# Patient Record
Sex: Male | Born: 1980 | Race: Black or African American | Hispanic: No | Marital: Single | Smoking: Never smoker
Health system: Southern US, Community
[De-identification: ages and names within clinical notes are randomized; demographics above are authoritative.]

## PROBLEM LIST (undated history)

## (undated) DIAGNOSIS — E785 Hyperlipidemia, unspecified: Secondary | ICD-10-CM

## (undated) DIAGNOSIS — G459 Transient cerebral ischemic attack, unspecified: Secondary | ICD-10-CM

## (undated) DIAGNOSIS — I639 Cerebral infarction, unspecified: Secondary | ICD-10-CM

## (undated) DIAGNOSIS — F419 Anxiety disorder, unspecified: Secondary | ICD-10-CM

## (undated) DIAGNOSIS — E876 Hypokalemia: Secondary | ICD-10-CM

## (undated) DIAGNOSIS — E669 Obesity, unspecified: Secondary | ICD-10-CM

## (undated) DIAGNOSIS — I10 Essential (primary) hypertension: Secondary | ICD-10-CM

## (undated) DIAGNOSIS — F32A Depression, unspecified: Secondary | ICD-10-CM

## (undated) DIAGNOSIS — E119 Type 2 diabetes mellitus without complications: Secondary | ICD-10-CM

## (undated) DIAGNOSIS — G43909 Migraine, unspecified, not intractable, without status migrainosus: Secondary | ICD-10-CM

## (undated) DIAGNOSIS — K219 Gastro-esophageal reflux disease without esophagitis: Secondary | ICD-10-CM

## (undated) DIAGNOSIS — I4891 Unspecified atrial fibrillation: Secondary | ICD-10-CM

## (undated) HISTORY — DX: Obesity, unspecified: E66.9

## (undated) HISTORY — PX: HAND SURGERY: SHX662

## (undated) HISTORY — PX: TONSILLECTOMY: SUR1361

## (undated) HISTORY — DX: Gastro-esophageal reflux disease without esophagitis: K21.9

## (undated) HISTORY — DX: Transient cerebral ischemic attack, unspecified: G45.9

## (undated) HISTORY — DX: Hypokalemia: E87.6

## (undated) HISTORY — DX: Anxiety disorder, unspecified: F41.9

## (undated) HISTORY — DX: Depression, unspecified: F32.A

## (undated) HISTORY — PX: OTHER SURGICAL HISTORY: SHX169

## (undated) NOTE — ED Notes (Signed)
 Formatting of this note might be different from the original. Patient BIB EMS for CVA. Patient was at dialysis when sudden onset of left sided chest pain started and RUE RLE numbness around 0830. GCS 15. VSS. BG 118. Patient given 324mg  Aspirin  with EMS. Patient has fistula to LUE, M/W/F. + Eliquis - last taken today. Reports missing a dose of Eliquis  Wednesday night. 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  Electronically signed by Cris Fine, RN at 06/28/2023 11:57 AM EDT

## (undated) NOTE — Progress Notes (Signed)
 Formatting of this note is different from the original. Images from the original note were not included. Name/Age:  Victor Bryan 11 y.o. ([id number]) Admitted: 06/28/2023 (HD #0) Admit diagnosis:  Acute right-sided weakness [R53.1]  Attending: Prentis Deters, MD   [facility] Medicine  Team IOU [phone]) Daily Progress Note for 06/29/2023    Subjective / Interval History    Patient seen and examined at bedside. Patient with no new complaints.   - At baseline mobility PT/OT Eval discontinued  - MRI preliminary with no acute intracranial abnormality. Specifically, no acute infarction or hemorrhage - TTE - with significant improvement from previous study   - Follow-up on TCD and CUS - Await neurology final recs  3 point ROS negative except per HPI  Objective  Vitals:  Temp  Avg: 36.7 C (98 F)  Min: 36.4 C (97.5 F)  Max: 37 C (98.6 F) Pulse  Avg: 90.2  Min: 85  Max: 96 BP  Min: 113/65  Max: 167/90 SpO2  Avg: 98.8 %  Min: 96 %  Max: 100 %  Weights:  Wt Readings from Last 3 Encounters:  06/28/23 97.5 kg (215 lb)  08/15/22 97.5 kg (215 lb)  08/12/09 (!) 112.9 kg (248 lb 12.8 oz)   Ins & Outs (24h): No intake or output data in the 24 hours ending 06/29/23 0740  Physical Exam: General: WNWD, NAD  HEENT: MMM, sclera anicteric  Cardiovascular: No JVD. RRR with normal S1, S2. No m/r/g. No pedal edema. LUE AVF + thrill and bruits  Respiratory: lungs CTAB without wheezes, rhonchi, or rales GI: abdomen soft, no tenderness or distention, +BS GU: no foley SKIN: no jaundice, no obvious lesions Extremities: no c/c/e  Neuro: A&OX3. LUE and LLE >RUE and LLE  Psych: appropriate mood and affect   Medications   Scheduled Medications: moisturizer/plaque preventer, 1 each, BID apixaban , 5 mg, BID [Held by Provider] carvedilol , 25 mg, BID [Held by Provider] hydrALAZINE , 100 mg, EVERY 8 HOURS esomeprazole, 40 mg, every morning before breakfast [Held by Provider]  torsemide, 100 mg, BID diuretics insulin  aspart, 0-8 Units, TID w/meals  And insulin  aspart, 0-4 Units, Nightly insulin  glargine, 16 Units, Nightly   Infusions:    PRN Medications: normal saline, 3 mL, PRN sennosides, 2 tablet, Nightly PRN polyethylene glycol, 17 g, Daily PRN acetaminophen , 325 mg, Q6H PRN acetaminophen , 650 mg, Q6H PRN acetaminophen , 975 mg, Q6H PRN glucose, 4 tablet, PRN  Or dextrose , 25 g, PRN  Labs & Diagnostics   Labs: Recent Labs    06/28/23 1121 06/29/23 0656  WBC 8.8  --   HGB 11.3*  --   HCT 34.3*  --   MCV 82.0  --   PLT 196  --   NA 138 137  K 3.5 4.0  CO2 34* 33*  BUN 23* 34*  CREATININE 4.5* 6.2*  ALT 6*  --   AST 13  --   ALKPHOS 65  --   BILITOT 0.4  --   BILIDIR 0.1  --   PROT 6.9  --   INR 1.2*  --    Micro/path:  Microbiology Results (last 3 day)     ** No results found for the last 72 hours. **     Radiology: Radiology Results (last 1 day)     Procedure Component Value Units Date/Time   MRI Brain wo Contrast [[id number]] Collected: 06/28/23 1838   Order Status: Completed Updated: 06/28/23 1914   Narrative:     EXAM: MRI BRAIN  WO CONTRAST  CLINICAL INDICATION:  Stroke, follow up No device( OSA, HTN). 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   TECHNIQUE: Multiplanar, multisequence imaging of the brain without contrast was performed. [id number].[id number]  COMPARISON: CT head/CTA head neck 06/28/2023.  FINDINGS: Parenchyma: No acute infarction on DWI. Chronic lacunar insult involves the right paramedian pons on image 10 series 19. Dilated perivascular spaces are identified in the inferior basal ganglia. Chronic lacunar insult involves the right rostrum of the  corpus callosum with additional  involvement of the right frontal coronal white matter. See for example axial image 17 and 18 series 19. No hemorrhage. No mass or mass effect.  Extra-axial Collection:  None.  Ventricular System: Normal.  Major Intracranial Flow Voids: Normal.  Bones/Soft tissues: No acute abnormality.  Included Orbits: No acute abnormality.  Paranasal Sinuses: Mucous retention cyst in the right frontal sinus and right maxillary sinus.  Tympanomastoid Cavities:  Partial right mastoid effusion..   Impression:     1.    No acute intracranial abnormality. Specifically, no acute infarction or hemorrhage. 2.    Chronic lacunar infarcts involve the right paramedian pons, right rostrum of the corpus callosum and right frontal corona white matter.   Preliminary findings were published to the electronic medical record by Marlyce Cowing, MD.  COMMUNICATION:  None.  Prelim reconciliation: RPR1 - Agree  The images were reviewed and interpreted by Matthew Zygmont, MD.   XR Chest X-ray 1 View [[id number]] Collected: 06/28/23 1317   Order Status: Completed Updated: 06/28/23 1320   Narrative:     EXAM: XR CHEST PA OR AP  CLINICAL INDICATION:  Chest Pain   COMPARISON: 08/15/2022   FINDINGS:  Support Devices: None   Lungs/pleura: No pneumothorax. There is bibasilar atelectasis. No pleural effusions.  Heart/mediastinum: Mildly enlarged, unchanged.  Other: None   Impression:     Bibasilar atelectasis. Superimposed mild edema would be difficult to exclude.    CT Angiogram Head Neck W Contrast [[id number]] Collected: 06/28/23 1226   Order Status: Completed Updated: 06/28/23 1249   Narrative:     EXAM: CT ANGIOGRAM HEAD NECK W CONTRAST, CT BRAIN PERFUSION W CONTRAST  CLINICAL INDICATION:  Neuro deficit, acute, stroke suspected.  TECHNIQUE: CTA and CTP of the head and CTA of the neck with IV contrast. Multiplanar reconstructed images. 3D reconstructions with MIP images were performed. Informed written consent  was obtained prior to administration of nonionic intravenous contrast  material per standard departmental protocol.  COMPARISON: None.  FINDINGS: CTA NECK: Aortic arch and proximal arch vessels: Normal.  Right carotid arterial system: Normal. Mildly tortuous course of the proximal ICA. Left carotid arterial system: Normal. Partially retropharyngeal course of the left ICA.  Right vertebral artery: Normal Left vertebral artery: Normal  Where applicable, evaluation of internal carotid artery (ICA) stenosis was performed using NASCET-like criteria, where the site of greatest stenosis is compared to the diameter of the ICA distal to the stenosis at a point where the ICA walls become  parallel.  Neck Soft Tissues: Normal.  Osseous Structures: Normal  Included Lung Apices: Normal.  CT PERFUSION: Perfusion images demonstrate mildly increased TMax in the right ACA territory, with a smaller region of patchy the decreased blood flow in the same distribution. These  findings correlate to the age-indeterminate lacunar infarctions seen on the  noncontrast head CT.  CTA HEAD:  Right anterior circulation (ICA/ACA/MCA): Normal. Left anterior circulation (ICA/ACA/MCA): Normal.  Vertebral and basilar arteries: Normal. Right posterior cerebral artery (PCA): Normal. Left posterior cerebral artery (PCA): Normal.  Aneurysm/Vascular malformation: None.  Dural Venous Sinuses: No venous filling defect.     Impression:     1.    No large vessel occlusion or hemodynamically significant stenosis in the major arterial vasculature of the head or neck. 2.    Mismatched area of decreased Tmax in the right ACA territory with patchy areas of decreased blood flow within this region. The areas of decreased blood flow correlate to to regions of age-indeterminate lacunar infarctions on the noncontrast head  CT. Findings are of unclear clinical significance in absence of corresponding vascular abnormality, and  correlation with clinical symptoms and consideration of further evaluation with MRI is recommended.   CT Brain Perfusion w Contrast [[id number]] Collected: 06/28/23 1226   Order Status: Completed Updated: 06/28/23 1249   Narrative:     EXAM: CT ANGIOGRAM HEAD NECK W CONTRAST, CT BRAIN PERFUSION W CONTRAST  CLINICAL INDICATION:  Neuro deficit, acute, stroke suspected.  TECHNIQUE: CTA and CTP of the head and CTA of the neck with IV contrast. Multiplanar reconstructed images. 3D reconstructions with MIP images were performed. Informed written consent was obtained prior to administration of nonionic intravenous contrast  material per standard departmental protocol.  COMPARISON: None.  FINDINGS: CTA NECK: Aortic arch and proximal arch vessels: Normal.  Right carotid arterial system: Normal. Mildly tortuous course of the proximal ICA. Left carotid arterial system: Normal. Partially retropharyngeal course of the left ICA.  Right vertebral artery: Normal Left vertebral artery: Normal  Where applicable, evaluation of internal carotid artery (ICA) stenosis was performed using NASCET-like criteria, where the site of greatest stenosis is compared to the diameter of the ICA distal to the stenosis at a point where the ICA walls become  parallel.  Neck Soft Tissues: Normal.  Osseous Structures: Normal  Included Lung Apices: Normal.  CT PERFUSION: Perfusion images demonstrate mildly increased TMax in the right ACA territory, with a smaller region of patchy the decreased blood flow in the same distribution. These findings correlate to the age-indeterminate lacunar infarctions seen on the  noncontrast head CT.  CTA HEAD:  Right anterior circulation (ICA/ACA/MCA): Normal. Left anterior circulation (ICA/ACA/MCA): Normal.  Vertebral and basilar arteries: Normal. Right posterior cerebral artery (PCA): Normal. Left posterior cerebral artery (PCA): Normal.  Aneurysm/Vascular malformation:  None.  Dural Venous Sinuses: No venous filling defect.     Impression:     1.    No large vessel occlusion or hemodynamically significant stenosis in the major arterial vasculature of the head or neck. 2.    Mismatched area of decreased Tmax in the right ACA territory with patchy areas of decreased blood flow within this region. The areas of decreased blood flow correlate to to regions of age-indeterminate lacunar infarctions on the noncontrast head  CT. Findings are of unclear clinical significance in absence of corresponding vascular abnormality, and correlation with clinical symptoms and consideration of further evaluation with MRI is recommended.   CT Acute Stroke Protocol(CT Head wo Contrast Only) [[id number]] Collected: 06/28/23 1147   Order Status: Completed Updated: 06/28/23 1229   Narrative:     EXAM: CT ACUTE STROKE PROTOCOL (CT HEAD WO CONTRAST ONLY)  CLINICAL INDICATION:   Neuro deficit, acute, stroke suspected.  TECHNIQUE:  CT examination of  the head without IV contrast. Axial, sagittal, and coronal reconstructed images.  COMPARISON:  CT dated 08/15/2022.  FINDINGS:  Surgical/Devices/Scout: None.  Parenchyma: No acute large territory infarction. Chronic right frontal corona radiata lacunar infarct. Interval adjacent hypodensity most likely also representing lacunar infarct, age-indeterminate (series 602, image 51). No intraparenchymal hemorrhage.  No mass or mass effect.  Extra-axial Collection:  None.  Ventricular System:  Normal.  Dural Venous Sinuses:  Normal attenuation.  Osseous/Soft Tissue Structures:  No acute abnormality.  Included Orbits: No acute abnormality.  Paranasal Sinuses:  Right greater than left posterior sinus mucous retention cysts.  Tympanomastoid Cavities:  Partial ossification of the right mastoid air cells.  Other:  Similar mild prominence of the nasopharyngeal soft tissues.    Impression:     1.    No intracranial hemorrhage or acute  large vascular territory infarct.  2.    Small right frontal corona radiata lacunar infarcts, one of which is new from comparison examination and age-indeterminate.  COMMUNICATION:  Critical findings were identified on 06/28/2023 11:34 AM and communicated to the Stroke Service via JOIN secure messaging application (with verification of receipt) by Dr. Sharolyn Kelly.  The images were reviewed and interpreted by Bert Expose, MD.     Telemetry/EKG:   Assessment & Plan  Travanti Mcmanus is a 74 y.o. 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 LKN 0800. He is not candidate for tenecteplase because he took his home eliquis  this AM.  He also was given 324 mg ASA per EMS.   Patient reports symptom resolution on my interview. - CT Acute stoke protocol negative for  acute infarct but notable for Chronic right frontal corona radiata lacunar infarct.  - CT Angio Head Neck with no large vessel occlusion or significant stenosis  - CT Brain Perfusion with  No large vessel occlusion or hemodynamically significant stenosis in the major arterial vasculature of the head or neck but with decreased blood flow to right ACA . - On admission neurology consulted recs noted and appreciated  Workup - Stroke labs: A1C, TSH, RPR, lipid panel, UDS, B12, HIV  - MRI brain, MRA head/neck wo contrast (No need for MRA if CTA already obtained)  - Hold home Eliquis  until MRI completed - TTE w/ bubble study  - Transcranial Doppler (ordered for you) - Carotid Ultrasound  (ordered for you) - Telemetry to monitor for arrhythmia/cardioembolic etiology -  w/u for Toxic/Metabolic/infectious contributing etiology   - Allow for Permissive HTN in the first 24-48 hours. Up to 220/110.  (No intervention was done)       * Can use Hydralazine  10mg  IV q6hrs and Labetalol  10mg  q5mins PRN for SBP>220 or DBP> 110  - NPO until passes swallow eval - PT/OT/speech therapy - Strict DM control as outpatient as appropriate. Avoid hyperglycemia as inpatient.  - Stroke lab results reviewed  - MRI preliminary No acute intracranial abnormality. Specifically, no acute infarction or hemorrhag  - Follow up on neuro imaging as discussed above (TCD, CUS) -  Passed bedside swallow - SLP consult deferred.  PT/OT discontinued patient at baseline and ambulates with stability  - Patient discussed that he was not placed on a statin despite CVA x 2 . Will defer starting per neurology   #ESRD on HD #Troponinemia #Chest pain- resolved  K-3.5, Cr. 4.5 and GFR 16, AG-10, Trop 40>43  -ESRD on HD MWFvia LFA AVF  - Last HD today at [facility] [place]. Session aborted 30 mins prior to end  - Reports compliance with all HD sessions.  - No urgent need to consult nephrology. Will consult renal if stay extends toward Monday - Follow-up on Hep B panel   - Not on any renal binders  - c/w Daily weight, renal diet, telemetry monitoring, strict I&O's  - CTM lytes   #Type II DM Ha1c 9.0% 06/28/23 - Home regimen Tresiba 20 units Daily and Ozempic 1 mg SQ weekly.  [place] Ohs non-formulary. Continue Lantus 16 units daily  - POCT, AC/HS +SSI . Patient requesting to use his freestyle libre for glucose checks - Carb controlled diet  #HFpEF BNP 164  Notable improvement on this admission 06/29/23 TTE LVEF 65-70% with normal LV diastolic function and NRWM - Last TTE 04/30/2023 at OSH LVEF 51-55% GIIDD  Euvolemic on exam , not in acute exacerbation  - Hold home GDMT: Hydralazine  100 mg PO TID,  Carvedilol  25 mg PO BID and Torsemide 100 mg PO BID to allow for permissive hypertension  - Low sodium diet 2 g and fluid restriction <1.5 L, telemetry, daily weight    #Atrial fibrillation  #Longterm anticogulation TSH 0.89 - Rate controlled and reports compliance with home meds but missed dose on Wednesday  - Continue with home dose Eliquis  5 mg PO BID pending MRI findings - Telemetry   #OSA - Patient does not use a CPAP machine at home and requesting a sleep study - Ambulatory referral on dispo   Fluids:  None  Diet:   Renal Carb Control low Na diet 1 L Fluid restriction   Lines and Catheters:  PIV LUE AVF  VTE Prophylaxis:   Home dose Eliquis  Family Contact:  Primary Emergency Contact: Gordon,Suzette, Home Phone: [phone] Discharge Planning: Continues to require inpatient care.   Discharge Planning     (HD #0)   Anticipated Discharge Date 06/30/2023  Anticipated Discharge Location Home  Barriers to Clinical Progression  TCD, CUS, Neuro final recs    Code status. Full Code.  Arbutus Motto, NP  [facility] Medicine APP  Team IOU Team Pager ID: [phone] Date: 06/29/2023  I spent 35 minutes preparing to see the patient, obtaining history from the patient, examining and evaluating the patient, counseling and educating the patient, ordering medications and tests, documenting clinical information in the the electronic health record and care coordination.   Electronically signed by Motto Arbutus Schultz, NP at 06/29/2023  8:00 AM EDT Electronically signed by Motto Arbutus Schultz, NP at 06/29/2023 10:01 AM EDT

## (undated) NOTE — Unmapped External Note (Signed)
 Formatting of this note might be different from the original. Family at bedside, pt denies additional needs at this time  Electronically signed by Daphane No, RN at 06/29/2023  2:57 PM EDT

## (undated) NOTE — Unmapped External Note (Signed)
 Formatting of this note might be different from the original. This pt requires contrast administration for emergency imaging to investigate potential CVA. Despite known renal disease, risks of delaying or omitting contrast administration do not outweigh the benefits. I suspect that delaying or omitting contrast administration would increase this patient's risk of debilitating or life-threatening consequences.   Alger Samule Armour, MD [facility] Medicine PGY-2  Cosigned by Blase Zachary Hilding, MD at 06/30/2023  1:03 PM EDT Electronically signed by Armour Alger Samule, MD at 06/28/2023 11:21 AM EDT Electronically signed by Blase Zachary Hilding, MD at 06/30/2023  1:03 PM EDT  Associated attestation - Blase Zachary Hilding, MD - 06/30/2023  1:03 PM EDT Formatting of this note is different from the original. 06/30/2023 1:03 PM EDT   I saw and evaluated the patient.  Discussed with resident and agree with resident?s findings and plan as documented in the resident?s note.  Zachary Hilding Blase, MD

## (undated) NOTE — Unmapped External Note (Signed)
 Formatting of this note might be different from the original. Pt at bedside  Electronically signed by Daphane No, RN at 06/29/2023 12:28 PM EDT

## (undated) NOTE — Unmapped External Note (Signed)
 Formatting of this note might be different from the original. No change in status, continue with current plan of care  Electronically signed by Daphane No, RN at 06/29/2023  9:57 AM EDT

## (undated) NOTE — Unmapped External Note (Signed)
 Formatting of this note might be different from the original. Provider at bedside, pt reports improving but not resolved right sided weakness  Electronically signed by Daphane No, RN at 06/29/2023  8:14 AM EDT Electronically signed by Daphane No, RN at 06/29/2023  8:21 AM EDT

## (undated) NOTE — Nursing Note (Signed)
 Formatting of this note might be different from the original. Pt provided with juice and crackers. Pt denies needing anything else at this time Electronically signed by Trinidad Grate, RN at 06/28/2023  9:41 PM EDT
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
## (undated) NOTE — Progress Notes (Signed)
 Formatting of this note might be different from the original. Pt discharged in stable condition with all personal belongings  Electronically signed by Daphane No, RN at 06/29/2023  6:24 PM EDT
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
## (undated) NOTE — Nursing Note (Signed)
 Formatting of this note might be different from the original. Patient refusing when returned from MRI to get Blood sugar states the machine he has on him tell Youth worker signed by Gerldine Pippin, RN at 06/28/2023  8:16 PM EDT

## (undated) NOTE — Progress Notes (Signed)
 Formatting of this note might be different from the original. [facility] BRIEF NOTE No stroke on MRI. Transient symptoms resolved in the setting of dialysis. Could be TIA vs. provoked by fluid shifts in HD with his existing stroke history.   Keep his home eliquis  Follow-up his chem 3 Continue Atorvastatin  40mg  daily unless he has an LDL>70, then please place him on Atorvastatin  80mg  daily.   Jasmine Abram, MD  [facility] Neurology PGY-3 Cosigned by Navalkele, Digvijaya, MD at 07/06/2023  6:56 PM EDT Electronically signed by Milta Rolin Geroge Willma, MD at 06/29/2023 10:32 PM EDT Electronically signed by Navalkele, Digvijaya, MD at 07/06/2023  6:56 PM EDT  Associated attestation - Navalkele, Digvijaya, MD - 07/06/2023  6:56 PM EDT Formatting of this note might be different from the original. I am administratively signing this document. Digvijaya  Navalkele, MD

## (undated) NOTE — Unmapped External Note (Signed)
 Formatting of this note might be different from the original. CRITICAL CARE NOTE  Critical Care Time:  34 minutes separate from teaching time and exclusive of procedure time.  Patient Impending Deterioration:  Central Nervous System  Assoc Risk Factors:   Management:  Bedside Assessment and Supervision of Care   Treatment Response: see below  Additional Notes:  LKN 0830.  At HD.  Occurred during treatment.  On eliquis .  Electronically signed by Blase Zachary Hilding, MD at 06/30/2023  8:54 AM EDT

## (undated) NOTE — Unmapped External Note (Signed)
 Formatting of this note might be different from the original.  Problem: Knowledge Deficit Goal: Patient will verbalize and/or demonstrate understanding of teaching by discharge Outcome: Continue with Current Goal Goal: Patient will Participate in learning opportunities offered by staff Outcome: Continue with Current Goal  Problem: Pain/Comfort Goal: Adequate pain control while hospitalized Outcome: Continue with Current Goal  Problem: Fall Risk Goal: Patient will remain as independent as possible Outcome: Continue with Current Goal Goal: Patient will have lower fall risk. Lower injury risk. Outcome: Continue with Current Goal Goal: Patient will remain safe from falls and injury Outcome: Continue with Current Goal Goal: Patient/family will understand fall prevention measures Outcome: Continue with Current Goal Goal: Patient/family will understand injury reduction measures Outcome: Continue with Current Goal Goal: Patient/family will comply with fall program Outcome: Continue with Current Goal Goal: Patient/family will verbalize fall prevention strategies to implement after discharge Outcome: Continue with Current Goal  Electronically signed by Trinidad Grate, RN at 06/28/2023  9:39 PM EDT

## (undated) NOTE — Discharge Summary (Signed)
 Formatting of this note is different from the original. Images from the original note were not included. Physician Discharge Summary   Patient ID: Victor Bryan [id number] 42 y.o. [birth year]  Admit date: 06/28/2023  Discharge date and time: 06/29/2023 and 1630 PM EST   Interpreter used on day of discharge: no  Admitting Physician: Prentis Deters, MD   Discharge Physician:  Prentis Deters, MD   Admission Diagnoses: Acute right-sided weakness [R53.1]  Discharge Diagnoses: Acute right-sided weakness  Incidental findings: None   Admission Condition: fair  Discharged Condition: good  Indication for Admission: Victor Bryan is a 71 y.o. 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 Medicine IOU asked to admit patient for Stroke work-up and medical management   Hospital Course:  #Acute right upper and lower extremity weakness - resolved  #Hx Prior CVA #TIA   Presented to [facility] as stroke alert from Dialysis center with acute onset of right upper and lower extremity weakness. LKN 0800. Neurology consulted recs noted and appreciated. He was not candidate for tenecteplase because he took his home eliquis  this AM.  He also was given 324 mg ASA per EMS. Patient symptoms resolved within hours of [facility] arrival.  CT Acute stoke protocol negative for  acute infarct but notable for Chronic right frontal corona radiata lacunar infarct. CT Angio Head Neck with no large vessel occlusion or significant stenosis. CT Brain Perfusion with  No large vessel occlusion or hemodynamically significant stenosis in the major arterial vasculature of the head or neck but with decreased blood flow to right ACA .  MRI with no acute intracranial abnormality.  Specifically, no acute infarction or hemorrhage. CUS  findings patent and no hemodynamic significant stenosis bilaterally. TCD findings velocities are consistent with mild stenosis in bilateral MCAs and recs to repeat TCD in 6-12 months. Unclear etiology as to what mitigated his presenting TIA symptoms. Can consider given the fact that he has atrial fibrillation in the setting of missed Eliquis  complicated by his need for HD.  - Discharged with Atorvastatin  80 mg PO daily sent to home pharmacy   #ESRD on HD #Troponinemia #Chest pain- resolved  K-4.0  -Resume HD sessions MWF as scheduled on dispo    #Type II DM Ha1c 9.0% 06/28/23 - Continue with Tresiba 20 units Daily and Ozempic 1 mg SQ weekly on dispo.   #HFpEF BNP 164  Notable improvement on this admission 06/29/23 TTE LVEF 65-70% with normal LV diastolic function and NRWM - Last TTE 04/30/2023 at OSH LVEF 51-55% GIIDD  - Continue with GDMT: Hydralazine  100 mg PO TID, Carvedilol  25 mg PO BID and Torsemide 100 mg  on dispo  - Has close follow-up with cardiology on 07/02/23   #Atrial fibrillation  #Longterm anticogulation - Rate controlled, continue with home dose Eliquis  5 mg PO BID. - Discussed medication compliance with patient   #OSA - Ambulatory referral placed for sleep study on dispo    Consults: neurology  Significant Diagnostic Studies:  Labs: CMP: Recent Labs    06/28/23 1121 06/29/23 0656  NA 138 137  K 3.5 4.0  CL 94* 94*  CO2 34* 33*  BUN 23* 34*  CREATININE 4.5* 6.2*  GLU 131* 242*  CALCIUM  8.6*  8.5*  BILITOT 0.4  --   BILIDIR 0.1  --   ALKPHOS 65  --   AST 13  --   ALT 6*  --   PROT 6.9  --   LABALB 3.9 3.7   CBC and Coags: Recent Labs    06/28/23 1121  WBC 8.8  HGB 11.3*  HCT 34.3*  MCV 82.0  PLT 196  INR 1.2*   Cardiac Enzymes: No results for input(s): CKTOTAL, CKMB, TROPONINI in the last 72 hours.  Micro/path:  Microbiology Results (last 3 day)     ** No results found for the  last 72 hours. **     Radiology: Radiology Results (last 1 day)     Procedure Component Value Units Date/Time   NEURO TRANSCRANIAL DOPPLER COMPLETE (TCD) [[id number]] Collected: 06/29/23 1228   Order Status: Completed Updated: 06/29/23 1238   Narrative:     NEURO TCD Report:   Reason for Study: Stroke/TIA    COMPARISON:  CT angiogram 06/28/2023    FINDINGS: Complete report of waveforms and velocity measurements are present on images with preliminary report on PACS. All images were reviewed.    Impression:     There is evidence of elevated mean flow velocities in bilateral MCAs (right 85 cm/s, left 96 cm/s).   Velocities are consistent with mild stenosis in bilateral MCAs.  Other vessels insonated show no direct evidence of elevated velocities, stenosis or vasospasm.    Given evidence of elevated intracranial velocities, recommend follow up Neuro TCD in 6-12 months.   Beverley Piety, MD, RPNI Vascular Neurology Attending   NEURO CAROTID ULTRASOUND (DUPLEX) COMPLETE [[id number]] Collected: 06/29/23 1227   Order Status: Completed Updated: 06/29/23 1231   Narrative:     NEURO CAROTID DOPPLER ULTRASOUND  CLINICAL INDICATION:  Stroke/TIA     TECHNIQUE: Real-time gray-scale, pulsed-wave, and color Doppler images of the carotid arteries were obtained.  COMPARISON: CT angiogram 06/28/2023  FINDINGS: Complete report of waveforms and velocity measurements are present on images with preliminary report on PACS. All images were reviewed. Degree of stenosis in vessels based on The Society of Radiologist in Ultrasound (SRU) criteria for carotid  stenosis interpretation described in Carotid Artery Stenosis: Grayscale and Doppler Ultrasound Diagnosis -- SRU Consensus Conference.    Impression:     RICA with peak systolic velocity of 97 cm/s consistent with Less than 50%  stenosis with minimal  plaque noted in the proximal ICA.  LICA with peak systolic velocity of 76 cm/s consistent with   Less than 50%  stenosis with mild hyperechoic plaque noted in the proximal ICA.   R Subclavian artery patent, no hemodynamic significant stenosis  L Subclavian artery patent, no hemodynamic significant stenosis   R Vertebral artery antegrade.  L Vertebral artery antegrade.   Beverley Piety, MD, [facility] Vascular Neurology Attending   MRI Brain wo Contrast [[id number]] Collected: 06/28/23 1838   Order Status: Completed Updated: 06/28/23 1914   Narrative:     EXAM: MRI BRAIN WO CONTRAST  CLINICAL INDICATION:  Stroke, follow up No device( OSA, HTN). Hypertension, Atrial fibrillation on Eliquis , , HFpEF, Type II DM, ESRD on HD, OSA,, prior CVA x2 (Aug 23, 2021 & May 29, 2021) without deficit presenting [facility]  from dialysis center as a stroke  alert reporting acute onset of right weakness towards the end of his HD session.   TECHNIQUE: Multiplanar, multisequence imaging of the brain without contrast was performed. ESRC.[id number]  COMPARISON: CT head/CTA head neck 06/28/2023.  FINDINGS: Parenchyma: No  acute infarction on DWI. Chronic lacunar insult involves the right paramedian pons on image 10 series 19. Dilated perivascular spaces are identified in the inferior basal ganglia. Chronic lacunar insult involves the right rostrum of the  corpus callosum with additional involvement of the right frontal coronal white matter. See for example axial image 17 and 18 series 19. No hemorrhage. No mass or mass effect.  Extra-axial Collection:  None.  Ventricular System: Normal.  Major Intracranial Flow Voids: Normal.  Bones/Soft tissues: No acute abnormality.  Included Orbits: No acute abnormality.  Paranasal Sinuses: Mucous retention cyst in the right frontal sinus and right maxillary sinus.  Tympanomastoid Cavities:  Partial right mastoid effusion..   Impression:     1.    No acute intracranial abnormality. Specifically, no acute infarction or hemorrhage. 2.    Chronic lacunar infarcts involve the  right paramedian pons, right rostrum of the corpus callosum and right frontal corona white matter.   Preliminary findings were published to the electronic medical record by Marlyce Cowing, MD.  COMMUNICATION:  None.  Prelim reconciliation: RPR1 - Agree  The images were reviewed and interpreted by Donnice Skiff, MD.   Treatments: Observation and neuro imaging   Discharge Exam: General: WNWD, NAD  HEENT: MMM, sclera anicteric  Cardiovascular: No JVD. RRR with normal S1, S2. No m/r/g. No pedal edema. LUE AVF + thrill and bruits  Respiratory: lungs CTAB without wheezes, rhonchi, or rales GI: abdomen soft, no tenderness or distention, +BS GU: no foley SKIN: no jaundice, no obvious lesions Extremities: no c/c/e  Neuro: A&OX3. No focal deficit   Psych: appropriate mood and affect   Disposition and Future Appointments:  Home  The following appointments also exist: No future appointments. Contact information for follow-up     Mabel Blackwood, MD  Specialty: Family Medicine  Relationship: PCP - General   [address] [place] [address]  Phone: [phone]    Next Steps: Follow up     Discharge   Home or Self Care   Diagnosis: CVA (cerebral vascular accident) (CMS-HCC) [phone] Initial Service: Medicine Service Preferred contact method (text or Phone) and Number for Bed Status Update: [phone] Bed Type: MED/SURG [9] Isolation Type: Not applicable Are you completing this order on behalf of Bed Control?: Yes Treatment Team (Bed Control to complete): Medicine [facility] Admitting Attending: ALINNOR, IHEOMA D. [[id number]] ...  Follow-Ups: Follow up with Mabel Blackwood, MD (Family Medicine)   Patient Instructions:  Nelvin DERWIN [facility] It has been a pleasure taking care of you! You were admitted to the hospital for Right upper and lower extremity weakness. You were evaluated for stroke. We also consulted neurology in your plan of care and performed stroke  labs and imaging to determine the cause of your symptoms.   Please pay close attention to your discharge paperwork as some of your medications have changed! New medications have been provided to you prior to leaving the hospital.   New medications: Current Discharge Medication List    START taking these medications   Details  atorvastatin  (LIPITOR) 80 MG tablet Take 1 tablet (80 mg total) by mouth every day. Qty: 90 tablet, Refills: 3   Comments: May substitute for rosuvastatin 40 mg due to insurance coverage Associated Diagnoses: Acute right-sided weakness     Please take your medications as prescribed to you. You should follow the list on your discharge summary.   Please do not skip any of your medications. If you are concerned that you  will be asleep when it is time to take your medication, please adjust the time you take it, but try to allow 12 hours between each dose. For medicines that are once a day, you can take them as soon as you wake up.   Please check your blood pressure regularly at home, at least once in the morning and once in the evening.  If you have diabetes, please check your blood sugar regularly at home, at least once in the morning and before mealtimes. You should check your blood sugar at least twice a day.  Future Appointments: Follow-up with all of your upcoming doctor's appointments as listed below. If you need to cancel or reschedule, please call [phone].   No future appointments.  If you develop fevers, chills, chest pain, shortness of breath, fainting, or any other new concerning symptoms, please return to the emergency department for evaluation.   Follow-up appointments have been scheduled for you. It is important that you attend all appointments as listed. Please bring this discharge paperwork to your appointment. If you need refills of any of these medications, please call your PCP.   ---  // Important numbers and information if needed: -  Blood Work:  WALK-IN to the main outpatient lab 1st floor, D wing.   From 7am-5pm Monday through Friday. - X-RAY: WALK-IN 3rd floor radiology, D wing.    From 7am-3pm Monday through Friday.  - PFTs (pulmonary function test):  2nd floor, D wing, Room 0051.   Call Central Scheduling to make appointment:  [phone] - CT Scan: 10th floor, B wing.   Call Central Scheduling to make appointment: [phone] - ECHO (heart ultrasound):  2nd floor, F wing.   Call to make appointment:  [phone] - [facility] (Main Outpatient), please call [phone] or [phone] for refill .  - Social Work questions, please call [phone].  - Nurse Advice Line [phone]  Arbutus Motto, APP   06/29/2023 4:25 PM EDT  Activity: activity as tolerated Diet: diabetic diet and renal diet  Wound Care Discharge Instructions: None   Medication List    START taking these medications    atorvastatin  80 MG tablet Commonly known as: LIPITOR Take 1 tablet (80 mg total) by mouth every day.     CONTINUE taking these medications    carvedilol  25 mg tablet Commonly known as: COREG   Eliquis  5 MG tablet Generic drug: apixaban   hydrALAZINE  100 mg tablet Commonly known as: APRESOLINE   omeprazole 40 mg capsule Commonly known as: PRILOSEC  torsemide 100 mg tablet Commonly known as: DEMADEX  Tresiba 100 units/mL injection Generic drug: insulin  degludec      Where to Get Your Medications    These medications were sent to [facility])  [address], [place] [address]    Hours: 7AM-7PM Mon-Fri Phone: [phone]  atorvastatin  80 MG tablet   I spent 35 minutes preparing to see the patient, obtaining history from the patient, examining and evaluating the patient, counseling and educating the patient, ordering medications and tests, documenting clinical information in the the electronic health record and care  coordination.  Signed: Arbutus Sharlynn Motto, NP 06/29/2023 4:27 PM EDT   Cosigned by Drenda Latin, MD at 06/30/2023 10:22 AM EDT Electronically signed by Motto Arbutus Sharlynn, NP at 06/29/2023  4:51 PM EDT Electronically signed by Drenda Latin, MD at 06/30/2023 10:22 AM EDT  Associated attestation - Drenda Latin, MD - 06/30/2023 10:22 AM EDT Formatting of  this note might be different from the original. Date of service: 06/30/23  I saw and evaluated the patient. I agree with the APP's discharge summary as documented. He still had some right sided weakness remaining, but it was much improved. Not sure if this was a TIA, but no stroke seen on MRI or other imaging to explain his symptoms. Was observed to ambulate and ate food without difficulty.   Nobie Deters, MD, PhD [facility] Medicine

## (undated) NOTE — H&P (Signed)
 Formatting of this note is different from the original. Images from the original note were not included. Inpatient History and Physical for Admission  Victor Bryan (MRN: [id number]) Admitting Service: [facility] [place], Pager [phone] Date of Admission:  06/28/2023  PRIMARY CARE PROVIDER: Mabel Blackwood, MD Admitting Attending: Prentis Deters, MD   Chief complaint:  Chief Complaint  Patient presents with   Cerebrovascular Accident    Patient BIB EMS for CVA. Patient was at dialysis with sudden onset of frontal headache, left sided chest pain, and RUE/RLE numbness. BG 118. +Eliquis . GCS 15. LKW 0830 AM today.    History of Present Illness:  Victor Bryan is a 25 y.o. 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  from dialysis center as a stroke alert reporting acute onset of right weakness towards the end of his HD session.   Patient reported this morning while on his way to dialysis he felt a wave of nausea without emesis. Towards the end of his session (30 mins) he became diaphoretic, associated with chest pain and he felt his right side go numb, he was unable to move his right hand and right foot. He then alert the technician, EMS alerted and hence his presentation to [facility]. Upon my interview patient reports that he is back to baseline with intact sensation and limb movement. Patient took all his home medication PTA including Eliquis  and did not meet criteria for TPA. He denies any illicit drug use.   In the ED, pt was given the following: Medications  normal saline 0.9 % flush 3 mL (has no administration in time range)  sennosides (SENNA-GEN) 8.6 MG tablet 2 tablet (has no administration in time range)  polyethylene glycol (MIRALAX ) packet 17 g (has no administration in time range)  moisturizer/plaque preventer (Antiseptic Mouthwash (Corinz)) mouth  swab 1 Swab (has no administration in time range)  acetaminophen  (TYLENOL ) tablet 325 mg (has no administration in time range)  acetaminophen  (TYLENOL ) tablet 650 mg (has no administration in time range)  acetaminophen  (TYLENOL ) tablet 975 mg (has no administration in time range)  apixaban  (ELIQUIS ) tablet 5 mg ( Oral Automatically Held 07/01/23 2200)  carvedilol  (COREG ) tablet 25 mg ( Oral Automatically Held 07/01/23 2200)  hydrALAZINE  (APRESOLINE ) tablet 100 mg ( Oral Automatically Held 07/01/23 1600)  esomeprazole (NEXIUM) capsule 40 mg (has no administration in time range)  torsemide (DEMADEX) tablet 100 mg ( Oral Automatically Held 07/01/23 1700)  glucose (DEX-4) chewable tablet 16 g (has no administration in time range)    Or  dextrose  50 % IV soln (has no administration in time range)  insulin  aspart (NOVOLOG ) injection 0-8 Units (has no administration in time range)    And  insulin  aspart (NOVOLOG ) injection 0-4 Units (has no administration in time range)  insulin  glargine (LANTUS) inj (DO NOT hold for low BG in NON-ICU patient) 16 Units (has no administration in time range)  iohexol  (OMNIPAQUE ) 350 mg/mL injection 115 mL (115 mL Intravenous Given 06/28/23 1145)    Review of Systems:  Review of Systems  Constitutional:  Positive for diaphoresis.  HENT: Negative.    Eyes: Negative.   Respiratory: Negative.    Cardiovascular:  Positive for chest pain.  Gastrointestinal:  Positive for nausea.  Genitourinary: Negative.   Musculoskeletal: Negative.   Skin: Negative.   Neurological:  Positive for sensory change and  weakness.  Endo/Heme/Allergies: Negative.   Psychiatric/Behavioral: Negative.     Medical History   PAST MEDICAL HISTORY:  has a past medical history of Atrial fibrillation (CMS-HCC), CHF (congestive heart failure) (CMS-HCC), Depression, Diabetes mellitus (CMS-HCC), Generalized anxiety disorder, Hypertension, Narcolepsy, PTSD (post-traumatic stress disorder), and Sleep  apnea.  PAST SURGICAL HISTORY:  has no past surgical history on file.  PAST SOCIAL HISTORY:  reports that he has never smoked. He does not have any smokeless tobacco history on file. He reports current alcohol use. He reports that he does not use drugs.  FAMILY HISTORY: family history is not on file.   Current Outpatient Medications  No Known Allergies  Outpatient Medications Marked as Taking for the 06/28/23 encounter [facility] Encounter)  Medication Sig Dispense Refill   apixaban  (ELIQUIS ) 5 MG tablet Take 5 mg by mouth 2 times every day.     insulin  degludec (TRESIBA) 100 units/mL injection Inject 20 Units into the skin every day. Inject 20 units into the skin once per day     carvedilol  (COREG ) 25 mg tablet Take 25 mg by mouth 2 times every day.     hydrALAZINE  (APRESOLINE ) 100 mg tablet Take 100 mg by mouth every 8 hours.     omeprazole (PRILOSEC) 40 mg capsule Take 40 mg by mouth every day.     torsemide (DEMADEX) 100 mg tablet Take 100 mg by mouth twice daily.     [DISCONTINUED] esomeprazole (NEXIUM) 40 MG capsule Take 1 capsule by mouth every day. 30 capsule 3   Physical Exam  Vitals: BP (!) 159/97   Pulse 85   Temp 36.7 C (98 F) (Oral)   Resp 16   SpO2 99%   Recent Weights Wt Readings from Last 3 Encounters:  08/15/22 97.5 kg (215 lb)  08/12/09 (!) 112.9 kg (248 lb 12.8 oz)   General: WNWD, NAD  HEENT: MMM, sclera anicteric  Cardiovascular: No JVD. RRR with normal S1, S2. No m/r/g. No pedal edema. LUE AVF + thrill and bruits  Respiratory: lungs CTAB without wheezes, rhonchi, or rales GI: abdomen soft, no tenderness or distention, +BS GU: no foley SKIN: no jaundice, no obvious lesions Extremities: no c/c/e  Neuro: A&OX3. No focal deficits Psych: appropriate mood and affect   Diagnostics & Imaging   XR Chest X-ray 1 View  Final Result  Bibasilar atelectasis. Superimposed mild edema would be difficult to exclude.      CT Angiogram Head Neck W Contrast   Final Result  1.    No large vessel occlusion or hemodynamically significant stenosis in the major arterial vasculature of the head or neck.  2.    Mismatched area of decreased Tmax in the right ACA territory with patchy areas of decreased blood flow within this region. The areas of decreased blood flow correlate to to regions of age-indeterminate lacunar infarctions on the noncontrast head   CT. Findings are of unclear clinical significance in absence of corresponding vascular abnormality, and correlation with clinical symptoms and consideration of further evaluation with MRI is recommended.   CT Brain Perfusion w Contrast  Final Result  1.    No large vessel occlusion or hemodynamically significant stenosis in the major arterial vasculature of the head or neck.  2.    Mismatched area of decreased Tmax in the right ACA territory with patchy areas of decreased blood flow within this region. The areas of decreased blood flow correlate to to regions of age-indeterminate lacunar infarctions on the noncontrast head  CT. Findings are of unclear clinical significance in absence of corresponding vascular abnormality, and correlation with clinical symptoms and consideration of further evaluation with MRI is recommended.   CT Acute Stroke Protocol(CT Head wo Contrast Only)  Final Result  1.    No intracranial hemorrhage or acute large vascular territory infarct.   2.    Small right frontal corona radiata lacunar infarcts, one of which is new from comparison examination and age-indeterminate.    COMMUNICATION:  Critical findings were identified on 06/28/2023 11:34 AM and communicated to the Stroke Service via JOIN secure messaging application (with verification of receipt) by Dr. Sharolyn Kelly.   The images were reviewed and interpreted by Bert Expose, MD.   NEURO TRANSCRANIAL DOPPLER COMPLETE (TCD)    (Results Pending)  NEURO CAROTID ULTRASOUND (DUPLEX) COMPLETE    (Results Pending)  CV  Echocardiogram Transthoracic w Contrast Complete    (Results Pending)  MRI Brain wo Contrast    (Results Pending)   EKG (interpreted by me in the absence of a cardiologist):   Labs   LABS REVIEWED  CBC  Lab Results  Component Value Date   WBC 8.8 06/28/2023   HGB 11.3 (L) 06/28/2023   PLT 196 06/28/2023   Lab Results  Component Value Date   NEUTOPHILPCT 49 07/26/2009   MONOPCT 7 07/26/2009   EOSPCT 2 07/26/2009   BASOPCT 1 07/26/2009   NEUTROABS 3.8 07/26/2009   LYMPHSABS 3.2 07/26/2009   MONOSABS 0.5 07/26/2009   EOSABS 0.2 07/26/2009   BASOSABS 0.1 07/26/2009   Basic Metabolic Panel  Lab Results  Component Value Date   NA 138 06/28/2023   K 3.5 06/28/2023   CL 94 (L) 06/28/2023   BUN 23 (H) 06/28/2023   CREATININE 4.5 (H) 06/28/2023   Lab Results  Component Value Date   CALCIUM  8.6 (L) 06/28/2023   MG 1.9 07/26/2009   PHOS 3.2 07/26/2009   Liver Function Tests  Lab Results  Component Value Date   BILITOT 0.4 06/28/2023   BILIDIR 0.1 06/28/2023   AST 13 06/28/2023   ALT 6 (L) 06/28/2023   ALKPHOS 65 06/28/2023   PROT 6.9 06/28/2023   Coags  Lab Results  Component Value Date   INR 1.2 (H) 06/28/2023   APTT 40.3 (H) 06/28/2023   Urinalysis  Lab Results  Component Value Date   GLUCOSEU 1+ =  70-100 mg/dL (A) 96/85/7974   BILIRUBINUR NEG = < 0.5 mg/dL 96/85/7974   UROBILINOGEN NORM<2.0 07/26/2009   KETONESU NEG = <10 mg/dL 96/85/7974   NITRITE NEG 06/28/2023   COLORU Light- Yellow 06/28/2023   Lactic Acid  No components found for: LACTICACIDPL   BNP  Lab Results  Component Value Date   BNP 164 (H) 06/28/2023   Troponins  No components found for: TROPONINIULT No components found for: POCTROP   Assessment & Plan  Victor Bryan is a 47 y.o. 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   #Acute right upper and lower extremity weakness - resolved  #Hx Prior CVA #TIA   Presented to [facility] as stroke alert from Dialysis center with acute onset of right upper and lower extremity weakness. LKN 0800. He is not candidate for tenecteplase  because he took his home eliquis  this AM.  He also was given 324 mg ASA per EMS.   Patient reports symptom resolution on my interview. - CT Acute stoke protocol negative for  acute infarct but notable for Chronic right frontal corona radiata lacunar infarct.  - CT Angio Head Neck with no large vessel occlusion or significant stenosis  - CT Brain Perfusion with  No large vessel occlusion or hemodynamically significant stenosis in the major arterial vasculature of the head or neck but with decreased blood flow to right ACA . - On admission neurology consulted recs noted and appreciated  Workup - Stroke labs: A1C, TSH, RPR, lipid panel, UDS, B12, HIV  - MRI brain, MRA head/neck wo contrast (No need for MRA if CTA already obtained)  - Hold home Eliquis  until MRI completed - TTE w/ bubble study  - Transcranial Doppler (ordered for you) - Carotid Ultrasound (ordered for you) - Telemetry to monitor for arrhythmia/cardioembolic etiology -  w/u for Toxic/Metabolic/infectious contributing etiology   - Allow for Permissive HTN in the first 24-48 hours. Up to 220/110.  (No intervention was done)       * Can use Hydralazine  10mg  IV q6hrs and Labetalol  10mg  q5mins PRN for SBP>220 or DBP> 110  - NPO until passes swallow eval - PT/OT/speech therapy - Strict DM control as outpatient as appropriate. Avoid hyperglycemia as inpatient.  - Stroke lab results reviewed  - Follow -up on PT/OT Eval - Follow up on neuro imaging as discussed above (TTE, TCD, MRI, CUS) - Passed bedside swallow - SLP consult deferred.  - Patient discussed that he was not placed on a  statin despite CVA x 2 . Will defer starting per neurology   #ESRD on HD #Troponinemia #Chest pain- resolved  K-3.5, Cr. 4.5 and GFR 16, AG-10, Trop 40>43  -ESRD on HD MWFvia LFA AVF  - Last HD today at [facility] [place]. Session aborted 30 mins prior to end  - Reports compliance with all HD sessions.  - No urgent need to consult nephrology. Will consult renal if stay extends toward Monday - Will order Hep B panel on next lab draw in preparation for HD  - Not on any renal binders  - Daily weight, renal diet, telemetry monitoring, strict I&O's  - CTM lytes   #Type II DM Ha1c 9.0% 06/28/23 - Home regimen Tresiba 20 units Daily and Ozempic 1 mg SQ weekly.  [place] Ohs non-formulary. Start Lantus 16 units daily  - POCT, AC/HS +SSI . Patient requesting to use his freestyle libre for glucose checks - Carb controlled diet  #HFpEF BNP 164  Last TTE 04/30/2023 at OSH LVEF 51-55% GIIDD  Euvolemic on exam , not in acute exacerbation  - Hold home GDMT: Hydralazine  100 mg PO TID, Carvedilol  25 mg PO BID and Torsemide 100 mg PO BID to allow for permissive hypertension  - Low sodium diet 2 g and fluid restriction <1.5 L, telemetry, daily weight    #Atrial fibrillation  #Longterm anticogulation TSH 0.89 - Rate controlled and reports compliance with home meds but missed dose on Wednesday  - Continue with home dose Eliquis  5 mg PO BID pending MRI findings - Telemetry   #OSA - Patient does not use a CPAP machine at home and requesting a sleep study - Ambulatory referral on dispo   Fluids:    Now   Diet:     Carb controlled diet. 2gr Na 1 L fluid restriction   Lines and  Catheters:    PIV and LUE AVF  DVT Prophylaxis:     Home dose Eliquis  (Hold for now) Ambulation:  Encouraged  Family Contact:  Primary Emergency Contact: Gordon,Suzette, Home Phone: [phone]  Discharge Planning     (HD #0)   Anticipated Discharge Date 06/30/2023  Anticipated Discharge Location Home   Barriers to Clinical Progression  Stroke work-up imaging, Neuro final recs     Dispo/Code status. Full Code.   Safiyah Sharlynn Motto, NP [facility] Medicine Team IOU Team Pager ID:  [phone] Date: 06/28/2023 Time: 3:47 PM EDT   Cosigned by Drenda Latin, MD at 06/29/2023  9:17 AM EDT Electronically signed by Motto Arbutus Sharlynn, NP at 06/28/2023  5:35 PM EDT Electronically signed by Motto Arbutus Sharlynn, NP at 06/28/2023  5:37 PM EDT Electronically signed by Motto Arbutus Sharlynn, NP at 06/29/2023  7:39 AM EDT Electronically signed by Drenda Latin, MD at 06/29/2023  9:17 AM EDT  Associated attestation - Drenda Latin, MD - 06/29/2023  9:17 AM EDT Formatting of this note might be different from the original. Date of service: 06/29/23  I saw and evaluated the patient. I agree with the APP's assessment and plan as documented. Patient still has some residual right sided limb weakness, although not as bad as yesterday. The numbness/tingling has resolved. MRI does not show a stroke. TTE without PFO. TCDs and carotids pending. Will follow up neuro recs on what else needs to be completed inpatient. Potential discharge this afternoon.   Nobie Drenda, MD, PhD [facility] Medicine

## (undated) NOTE — ED Notes (Signed)
 Formatting of this note might be different from the original.  Ambulance En-Route Information  Ambulance Call Information Date Call Received: 06/28/23 Time Call Received: 1040 Agency: [facility] Unit: 30  EMS Patient Information Patient Age: 76 Patient Gender: Male Patients Complaint/ Ems Treatment: stroke EMS Treatment: transport Was a Stat Pack pulled for this patient?: No This patient has a case type of?: Emergency  Stroke/STEMI/Sepsis/Cardiac Arrest?: Stroke Last Known Normal: 0830 Date Stroke Symptoms First Discovered: 06/28/23 Time Stroke Symptoms First Discovered: 0830 Patient Woke with Symptoms?: Unknown Symptoms Noticed?: Sudden onset of unilateral numbness, weakness, or paralysis of the face, arm, or leg Patient History: Previous Stroke or TIA Current Medications: Unknown Time Stroke Team Notified?: 1042 Stroke activation requested by?: EMS Time CT Notified?: 1042 Stroke Alert Complete?: Yes       Pre-Vitals Blood Pressure: 135/85 Heart Rate: 86 Respirations: 18 SpO2: 96 % BGL: 126 GCS Score: 15  Patient Arrival Information Arrival Date: 06/28/23 Arrival Time: 1113 Patient Disposition: Zone 3 Emergency  Fredderick Hussar  Electronically signed by Hussar Fredderick at 06/28/2023 11:17 AM EDT

## (undated) NOTE — ED Notes (Signed)
 Formatting of this note might be different from the original. Patient to CT scanner with RN, MD, and Neurology and portable monitor.  Electronically signed by Cris Fine, RN at 06/28/2023 11:46 AM EDT

## (undated) NOTE — ED Notes (Signed)
 Formatting of this note might be different from the original. Patient into room 43 with EMS.  Code IV. RN and MD at bedside to obtain ultrasound guided IV access.  Electronically signed by Cris Fine, RN at 06/28/2023 11:45 AM EDT

---

## 2009-08-12 DIAGNOSIS — G4733 Obstructive sleep apnea (adult) (pediatric): Secondary | ICD-10-CM | POA: Insufficient documentation

## 2009-08-12 DIAGNOSIS — F32A Depression, unspecified: Secondary | ICD-10-CM | POA: Insufficient documentation

## 2009-08-12 DIAGNOSIS — E119 Type 2 diabetes mellitus without complications: Secondary | ICD-10-CM | POA: Insufficient documentation

## 2009-08-12 DIAGNOSIS — E785 Hyperlipidemia, unspecified: Secondary | ICD-10-CM | POA: Insufficient documentation

## 2009-08-12 DIAGNOSIS — I4891 Unspecified atrial fibrillation: Secondary | ICD-10-CM | POA: Insufficient documentation

## 2009-08-12 DIAGNOSIS — I10 Essential (primary) hypertension: Secondary | ICD-10-CM | POA: Insufficient documentation

## 2014-03-01 DIAGNOSIS — B1920 Unspecified viral hepatitis C without hepatic coma: Secondary | ICD-10-CM | POA: Insufficient documentation

## 2014-03-01 DIAGNOSIS — I480 Paroxysmal atrial fibrillation: Secondary | ICD-10-CM | POA: Insufficient documentation

## 2014-03-01 DIAGNOSIS — I10 Essential (primary) hypertension: Secondary | ICD-10-CM | POA: Insufficient documentation

## 2014-03-01 DIAGNOSIS — E782 Mixed hyperlipidemia: Secondary | ICD-10-CM | POA: Insufficient documentation

## 2019-11-30 DIAGNOSIS — F1290 Cannabis use, unspecified, uncomplicated: Secondary | ICD-10-CM | POA: Insufficient documentation

## 2020-03-10 DIAGNOSIS — J189 Pneumonia, unspecified organism: Secondary | ICD-10-CM | POA: Insufficient documentation

## 2020-08-22 DIAGNOSIS — I21A1 Myocardial infarction type 2: Secondary | ICD-10-CM | POA: Insufficient documentation

## 2020-08-22 DIAGNOSIS — K2970 Gastritis, unspecified, without bleeding: Secondary | ICD-10-CM | POA: Insufficient documentation

## 2021-05-30 ENCOUNTER — Emergency Department (HOSPITAL_COMMUNITY): Payer: Medicare (Managed Care)

## 2021-05-30 ENCOUNTER — Encounter (HOSPITAL_COMMUNITY): Payer: Self-pay | Admitting: Emergency Medicine

## 2021-05-30 ENCOUNTER — Observation Stay (HOSPITAL_COMMUNITY)
Admission: EM | Admit: 2021-05-30 | Discharge: 2021-05-31 | Disposition: A | Discharge status: Home / Self Care | Payer: Medicare (Managed Care) | Attending: Internal Medicine | Admitting: Internal Medicine

## 2021-05-30 ENCOUNTER — Other Ambulatory Visit: Payer: Self-pay

## 2021-05-30 DIAGNOSIS — Z7901 Long term (current) use of anticoagulants: Secondary | ICD-10-CM | POA: Diagnosis not present

## 2021-05-30 DIAGNOSIS — I129 Hypertensive chronic kidney disease with stage 1 through stage 4 chronic kidney disease, or unspecified chronic kidney disease: Secondary | ICD-10-CM | POA: Insufficient documentation

## 2021-05-30 DIAGNOSIS — E1165 Type 2 diabetes mellitus with hyperglycemia: Secondary | ICD-10-CM | POA: Insufficient documentation

## 2021-05-30 DIAGNOSIS — Z79899 Other long term (current) drug therapy: Secondary | ICD-10-CM | POA: Diagnosis not present

## 2021-05-30 DIAGNOSIS — R2689 Other abnormalities of gait and mobility: Secondary | ICD-10-CM | POA: Insufficient documentation

## 2021-05-30 DIAGNOSIS — Z794 Long term (current) use of insulin: Secondary | ICD-10-CM | POA: Insufficient documentation

## 2021-05-30 DIAGNOSIS — R531 Weakness: Secondary | ICD-10-CM

## 2021-05-30 DIAGNOSIS — Z20822 Contact with and (suspected) exposure to covid-19: Secondary | ICD-10-CM | POA: Insufficient documentation

## 2021-05-30 DIAGNOSIS — R778 Other specified abnormalities of plasma proteins: Secondary | ICD-10-CM | POA: Diagnosis not present

## 2021-05-30 DIAGNOSIS — G459 Transient cerebral ischemic attack, unspecified: Secondary | ICD-10-CM | POA: Diagnosis not present

## 2021-05-30 DIAGNOSIS — G4733 Obstructive sleep apnea (adult) (pediatric): Secondary | ICD-10-CM | POA: Diagnosis present

## 2021-05-30 DIAGNOSIS — I10 Essential (primary) hypertension: Secondary | ICD-10-CM | POA: Diagnosis present

## 2021-05-30 DIAGNOSIS — N179 Acute kidney failure, unspecified: Secondary | ICD-10-CM | POA: Diagnosis not present

## 2021-05-30 DIAGNOSIS — E118 Type 2 diabetes mellitus with unspecified complications: Secondary | ICD-10-CM

## 2021-05-30 DIAGNOSIS — N189 Chronic kidney disease, unspecified: Secondary | ICD-10-CM | POA: Diagnosis not present

## 2021-05-30 DIAGNOSIS — I480 Paroxysmal atrial fibrillation: Secondary | ICD-10-CM | POA: Insufficient documentation

## 2021-05-30 DIAGNOSIS — I639 Cerebral infarction, unspecified: Secondary | ICD-10-CM | POA: Diagnosis present

## 2021-05-30 HISTORY — DX: Essential (primary) hypertension: I10

## 2021-05-30 HISTORY — DX: Migraine, unspecified, not intractable, without status migrainosus: G43.909

## 2021-05-30 HISTORY — DX: Hyperlipidemia, unspecified: E78.5

## 2021-05-30 HISTORY — DX: Type 2 diabetes mellitus without complications: E11.9

## 2021-05-30 HISTORY — DX: Unspecified atrial fibrillation: I48.91

## 2021-05-30 LAB — BASIC METABOLIC PANEL
Anion gap: 8 (ref 5–15)
BUN: 12 mg/dL (ref 6–20)
CO2: 23 mmol/L (ref 22–32)
Calcium: 9 mg/dL (ref 8.9–10.3)
Chloride: 105 mmol/L (ref 98–111)
Creatinine, Ser: 2.41 mg/dL — ABNORMAL HIGH (ref 0.61–1.24)
GFR, Estimated: 34 mL/min — ABNORMAL LOW (ref >60)
Glucose, Bld: 223 mg/dL — ABNORMAL HIGH (ref 70–99)
Potassium: 3.7 mmol/L (ref 3.5–5.1)
Sodium: 136 mmol/L (ref 135–145)

## 2021-05-30 LAB — CBC WITH DIFFERENTIAL/PLATELET
Abs Immature Granulocytes: 0.03 10*3/uL (ref 0.00–0.07)
Basophils Absolute: 0 10*3/uL (ref 0.0–0.1)
Basophils Relative: 0 %
Eosinophils Absolute: 0.2 10*3/uL (ref 0.0–0.5)
Eosinophils Relative: 2 %
HCT: 39.9 % (ref 39.0–52.0)
Hemoglobin: 13.1 g/dL (ref 13.0–17.0)
Immature Granulocytes: 0 %
Lymphocytes Relative: 20 %
Lymphs Abs: 1.7 10*3/uL (ref 0.7–4.0)
MCH: 25.4 pg — ABNORMAL LOW (ref 26.0–34.0)
MCHC: 32.8 g/dL (ref 30.0–36.0)
MCV: 77.5 fL — ABNORMAL LOW (ref 80.0–100.0)
Monocytes Absolute: 0.5 10*3/uL (ref 0.1–1.0)
Monocytes Relative: 5 %
Neutro Abs: 6.5 10*3/uL (ref 1.7–7.7)
Neutrophils Relative %: 73 %
Platelets: 280 10*3/uL (ref 150–400)
RBC: 5.15 MIL/uL (ref 4.22–5.81)
RDW: 13.1 % (ref 11.5–15.5)
WBC: 8.9 10*3/uL (ref 4.0–10.5)
nRBC: 0 % (ref 0.0–0.2)

## 2021-05-30 LAB — RESP PANEL BY RT-PCR (FLU A&B, COVID) ARPGX2
Influenza A by PCR: NEGATIVE
Influenza B by PCR: NEGATIVE
SARS Coronavirus 2 by RT PCR: NEGATIVE

## 2021-05-30 LAB — TROPONIN I (HIGH SENSITIVITY)
Troponin I (High Sensitivity): 35 ng/L — ABNORMAL HIGH (ref <18)
Troponin I (High Sensitivity): 38 ng/L — ABNORMAL HIGH (ref <18)

## 2021-05-30 IMAGING — MR MR THORACIC SPINE W/O CM
4 of 6 series · 20 of 48 positions shown · non-contrast
Comparison: None.

CLINICAL DATA: Mid back pain

EXAM:
MRI THORACIC SPINE WITHOUT CONTRAST
TECHNIQUE: Multiplanar, multisequence MR imaging of the thoracic spine was
performed. No intravenous contrast was administered.

[Series 3: T1 · sagittal · 3.0mm · 0.90mm/px · 4 of 16 slices shown (1 of 2)]
[im 1/16]
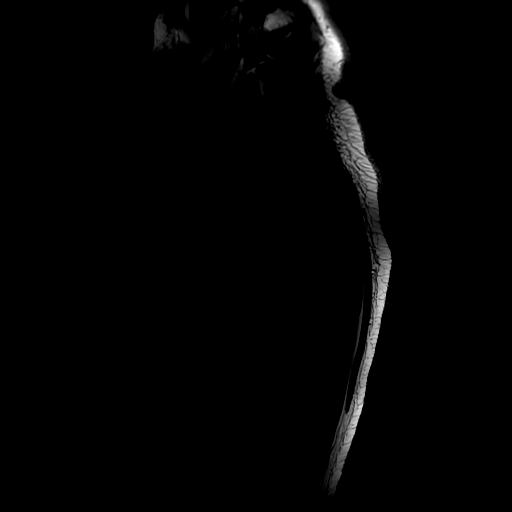
[im 4/16]
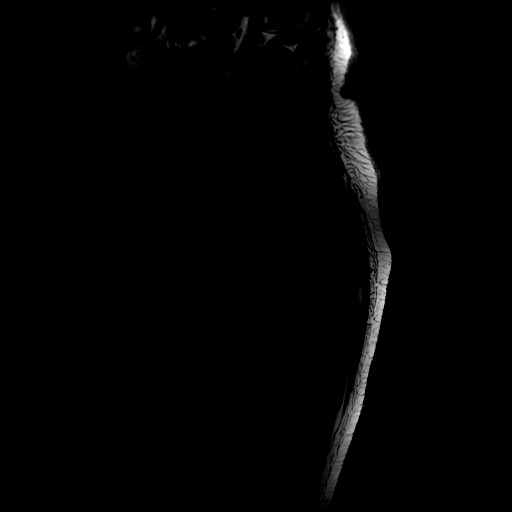
[im 8/16]
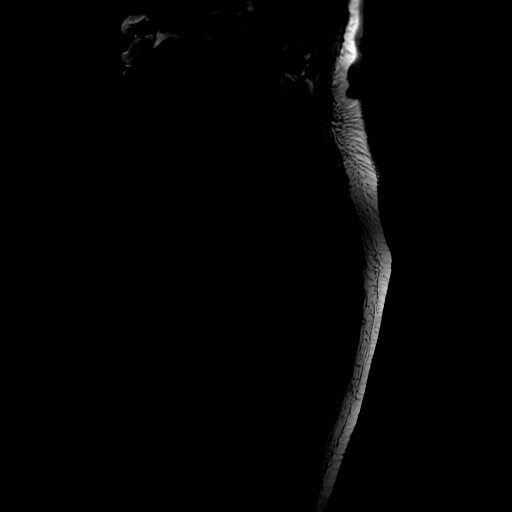
[im 16/16]
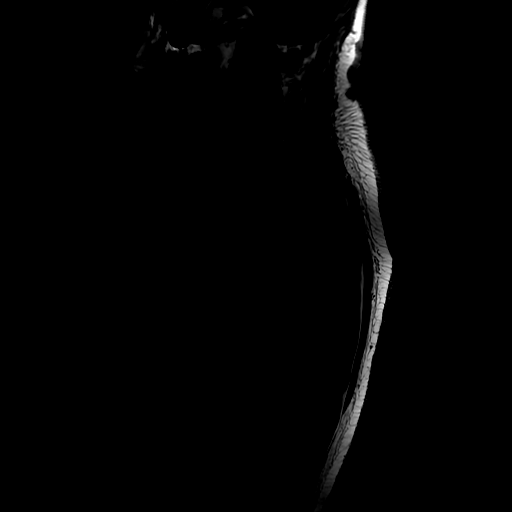

[Series 6: T2 · sagittal · 3.0mm · 0.66mm/px · 5 of 21 slices shown (1 of 2)]
[im 1/21]
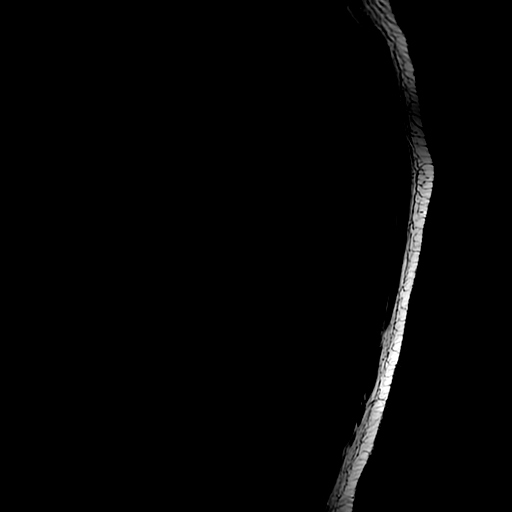
[im 6/21]
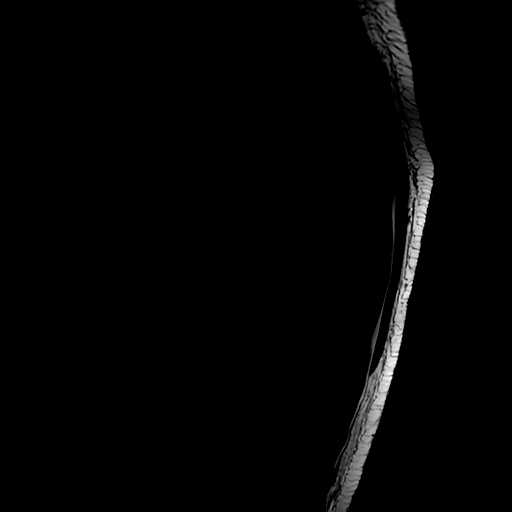
[im 11/21]
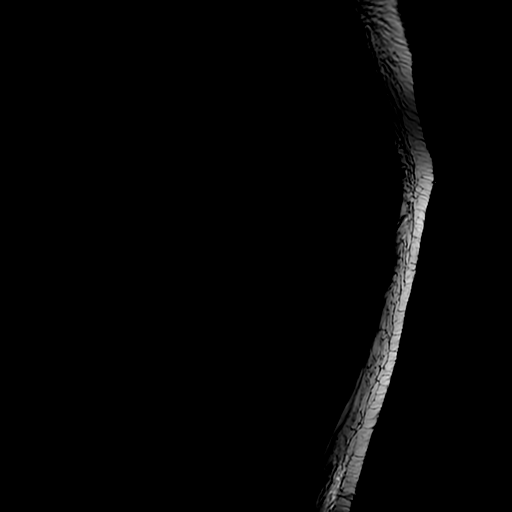
[im 16/21]
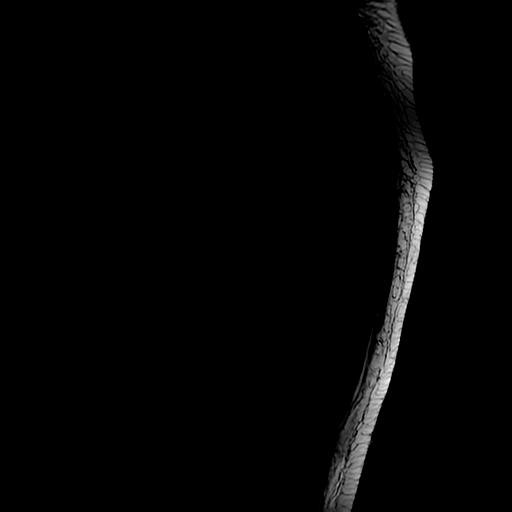
[im 21/21]
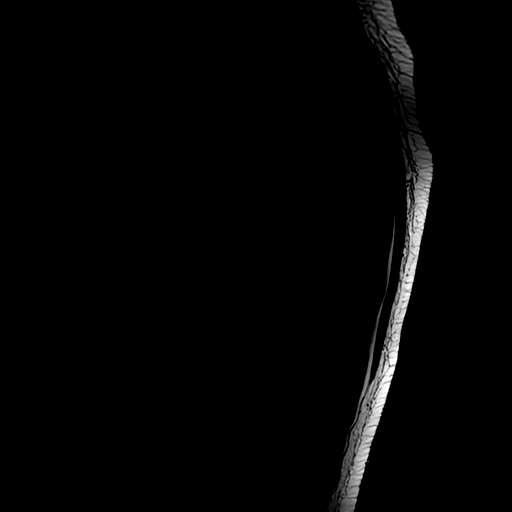

[Series 8: T1 · sagittal · 3.0mm · 0.66mm/px · 3 of 21 slices shown (2 of 2)]
[im 1/21]
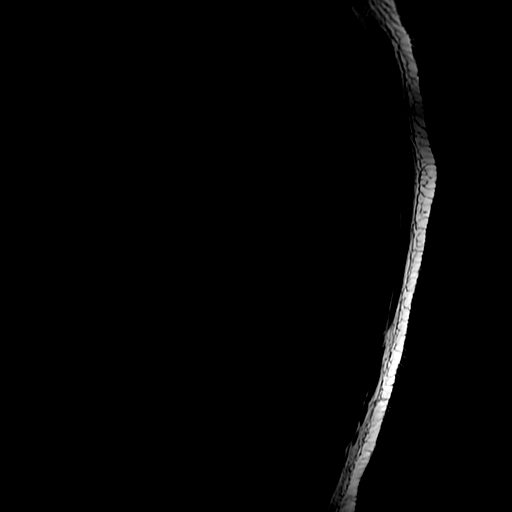
[im 11/21]
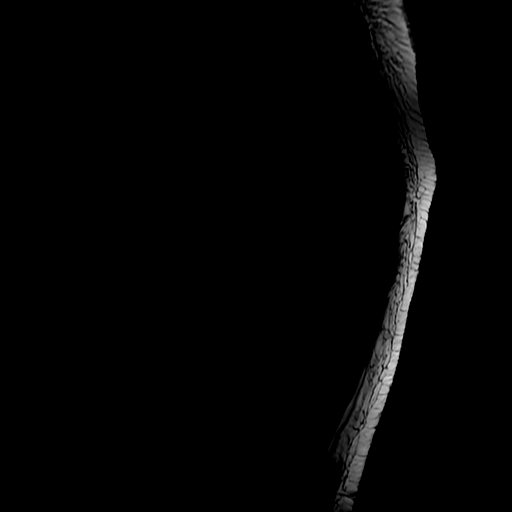
[im 21/21]
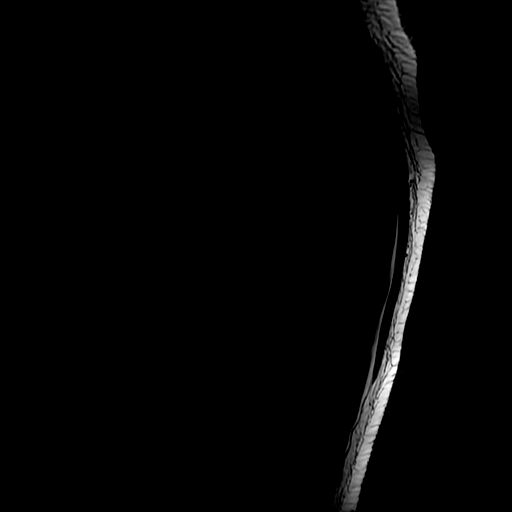

[Series 9: T2 · axial · 4.0mm · 0.39mm/px · z∈[-456,-154]mm · 8 of 56 slices shown (2 of 2)]
[im 1/56]
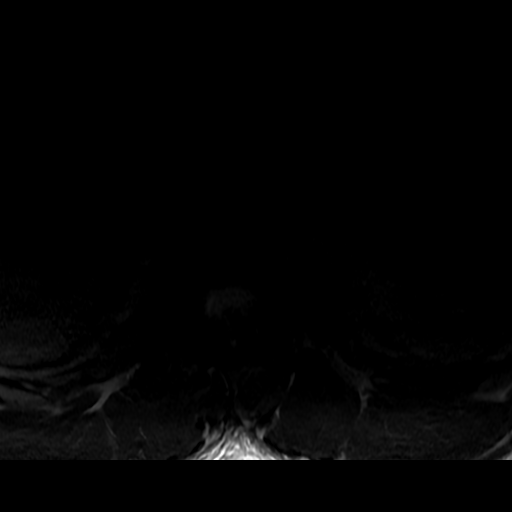
[im 9/56]
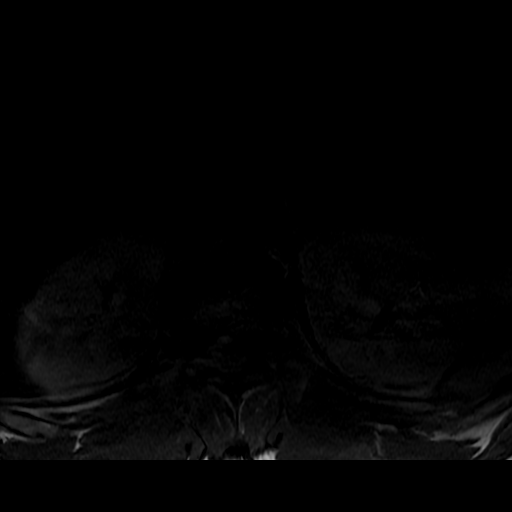
[im 17/56]
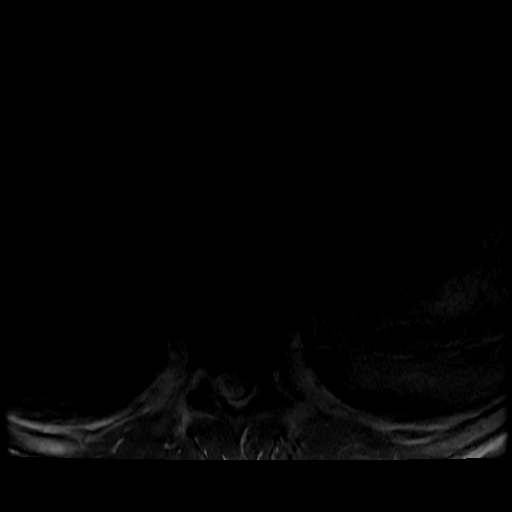
[im 26/56]
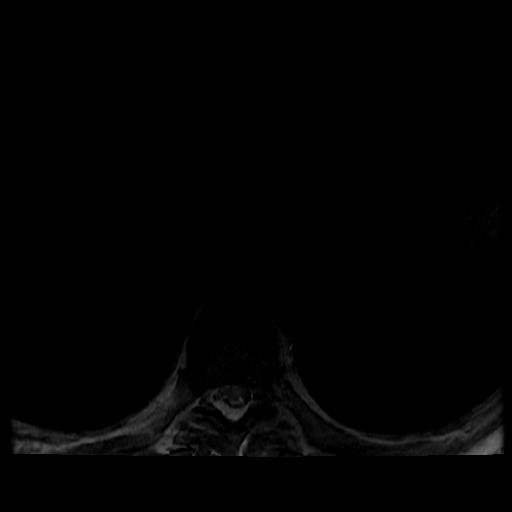
[im 30/56]
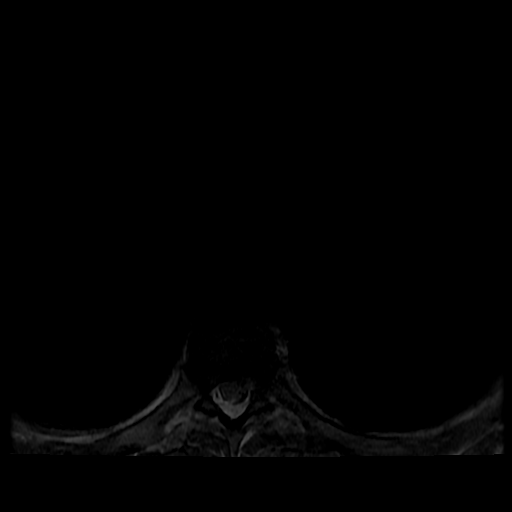
[im 39/56]
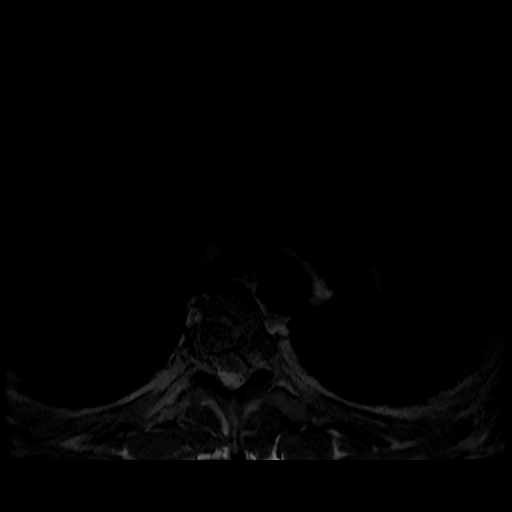
[im 47/56]
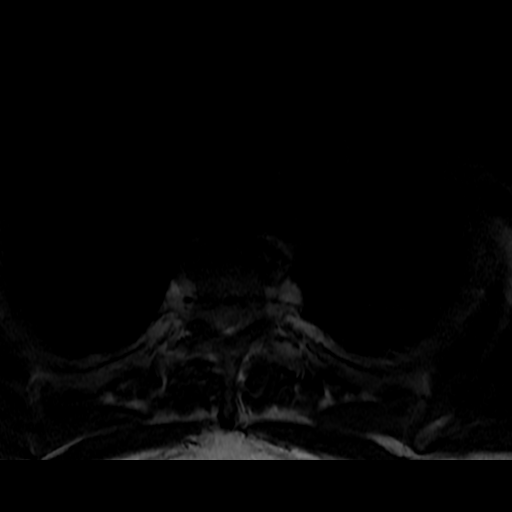
[im 56/56]
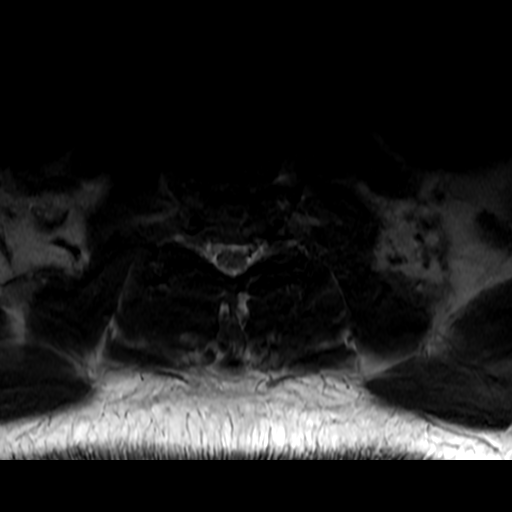

[20 of 48 positions shown; findings below may reference images not displayed]

FINDINGS: Alignment:  Physiologic.

Vertebrae: No fracture, evidence of discitis, or bone lesion.

Cord:  Normal signal and morphology.

Paraspinal and other soft tissues: Negative.

Disc levels:

No disc herniation or spinal canal stenosis.
IMPRESSION: Normal thoracic spine.

## 2021-05-30 IMAGING — CT CT HEAD W/O CM
4 series · 15 of 47 positions shown, 17 images · non-contrast
Comparison: No pertinent prior exams available for comparison.

CLINICAL DATA: Provided history: Dizziness, persistent/recurrent,
cardiac or vascular cause suspected. Additional history provided:
Patient reports dizziness, headache, worsening bilateral leg
weakness for 2 days.



[Series 3: head without · axial · non-contrast · 0.44mm/px · z∈[-150,-30]mm · 7 of 33 slices shown, 9 images]
[im 5/33  brain]
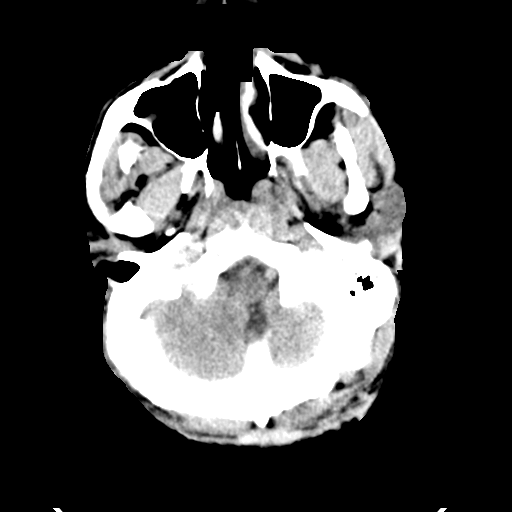
[im 5/33  bone]
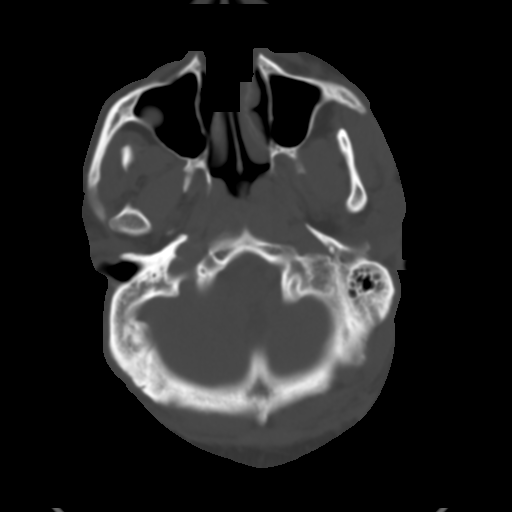
[im 9/33  brain]
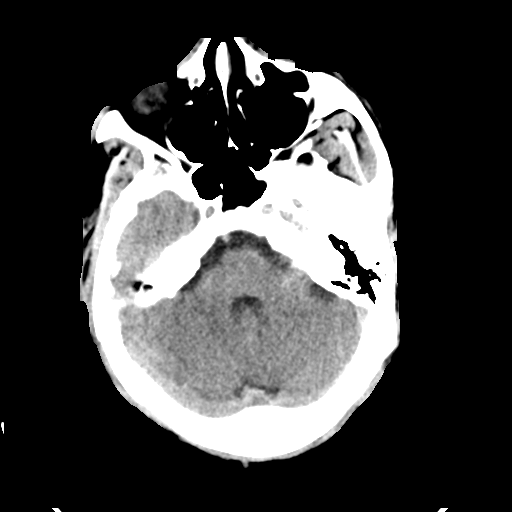
[im 13/33  brain]
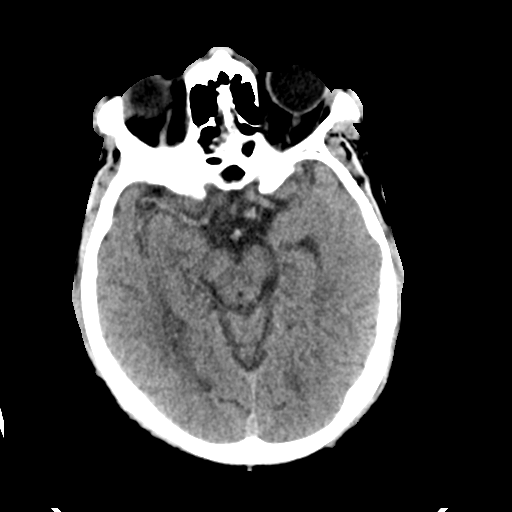
[im 17/33  brain]
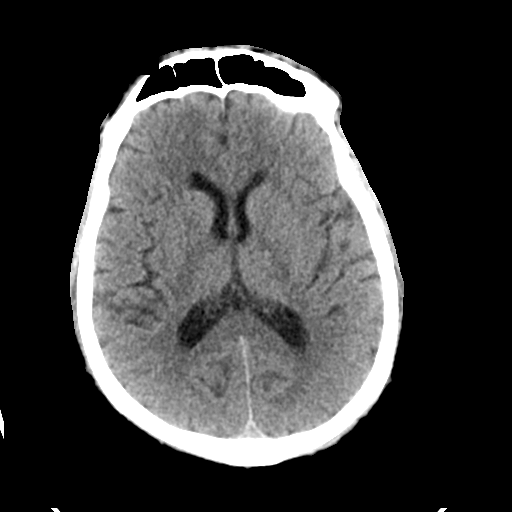
[im 21/33  brain]
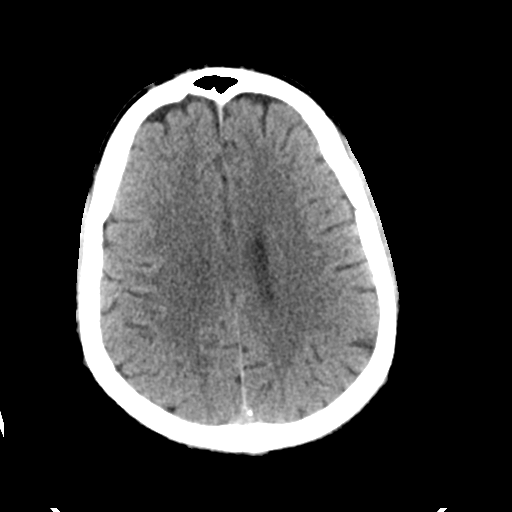
[im 21/33  bone]
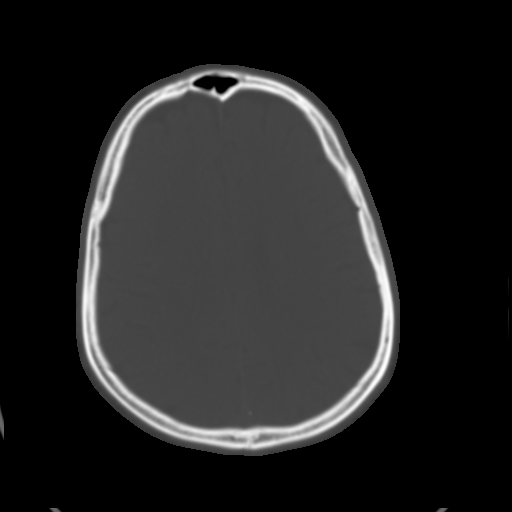
[im 25/33  brain]
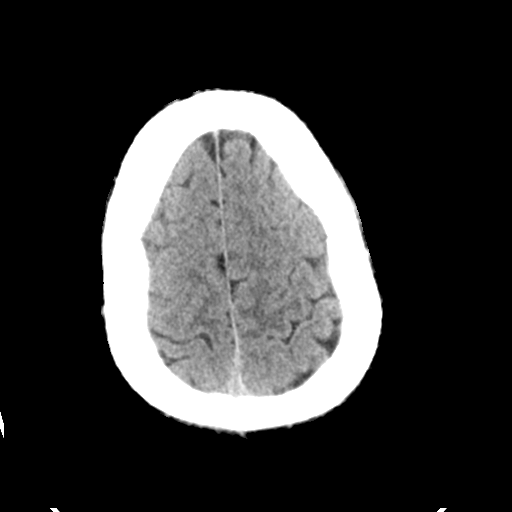
[im 29/33  brain]
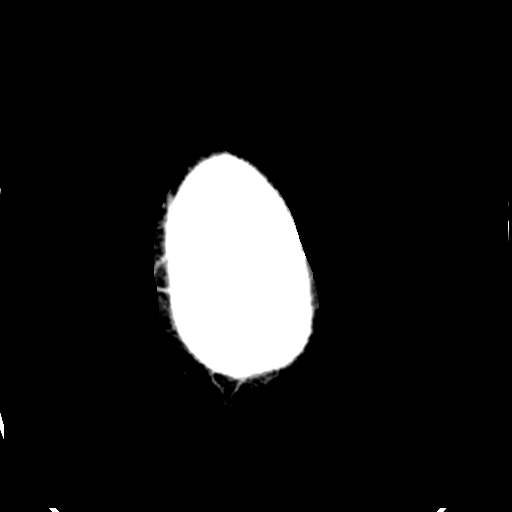

[Series 4: head bone · axial · 0.44mm/px · z∈[-154,-138]mm · 2 of 81 slices shown]
[im 9/81  bone]
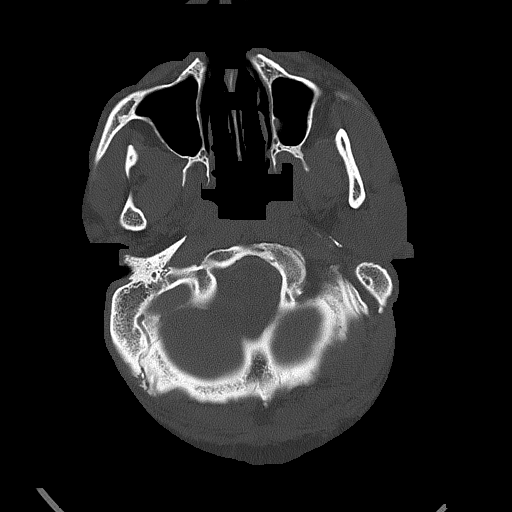
[im 17/81  bone]
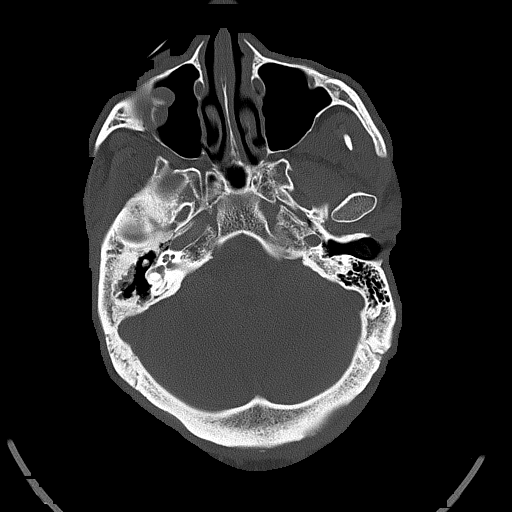

[Series 5: head without cor · coronal · non-contrast · 0.35mm/px · 3 of 67 slices shown]
[im 23/67  brain]
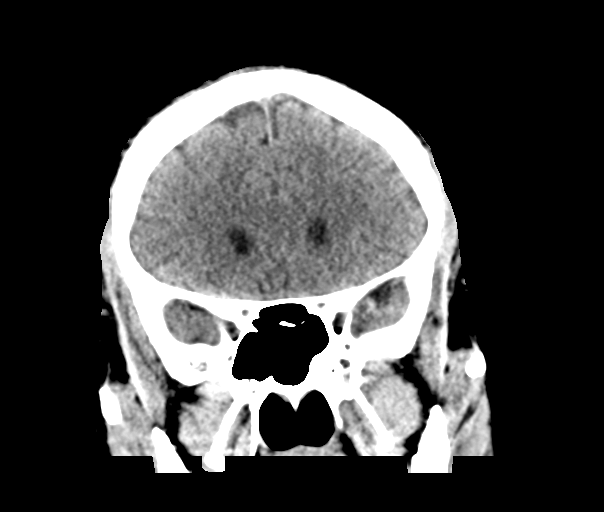
[im 30/67  brain]
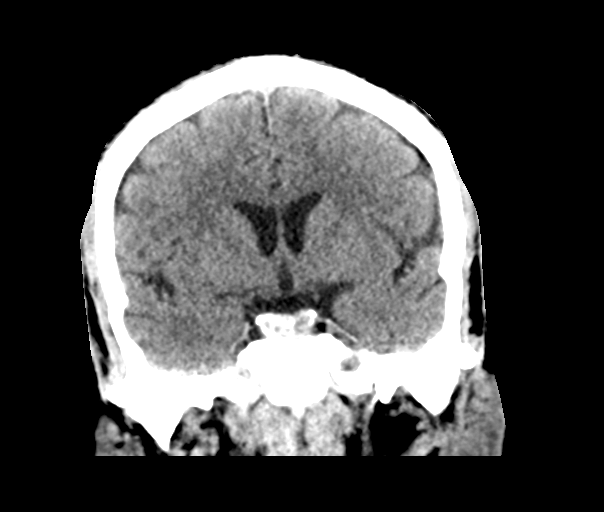
[im 37/67  brain]
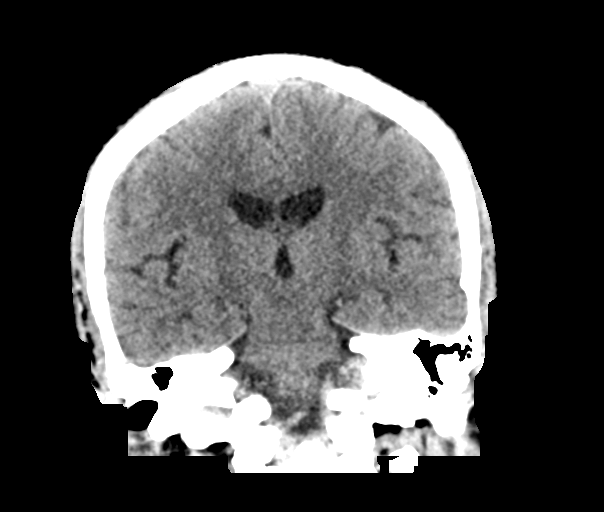

[Series 6: head without sag · sagittal · non-contrast · 0.34mm/px · 3 of 55 slices shown]
[im 19/55  brain]
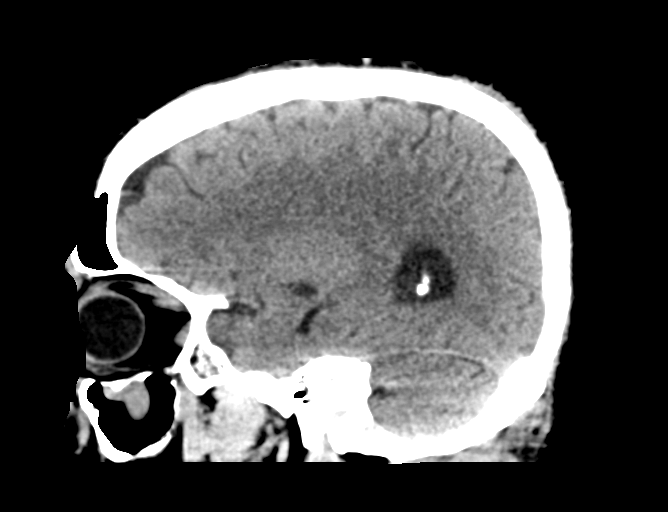
[im 28/55  brain]
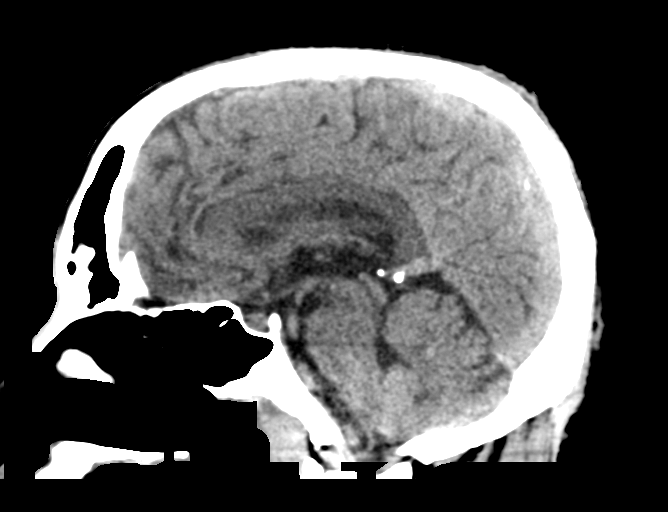
[im 37/55  brain]
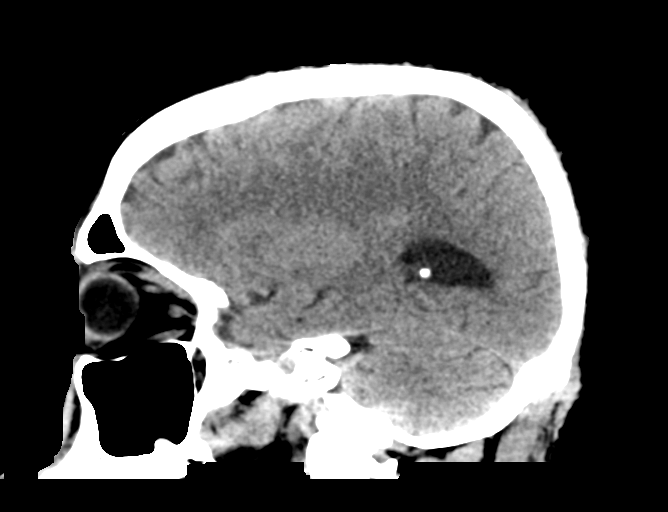

[15 of 47 positions shown; findings below may reference images not displayed]

FINDINGS: Brain:

Small circumscribed hypodensity within the inferior right basal
ganglia, likely reflecting a prominent perivascular space.

There is no acute intracranial hemorrhage.

No demarcated cortical infarct.

No extra-axial fluid collection.

No evidence of an intracranial mass.

No midline shift.

Vascular: No hyperdense vessel.

Skull: Normal. Negative for fracture or focal lesion.

Sinuses/Orbits: Visualized orbits show no acute finding. 17 mm
mucous retention cyst within the right maxillary sinus. Small mucous
retention cyst within the left maxillary sinus. Small volume frothy
secretions within a posterior right ethmoid air cell. Small mucous
retention cyst within the inferior right frontal sinus.

Other: Small-volume fluid within the right mastoid air cells. Debris
within the left external auditory canal.
IMPRESSION: No evidence of acute intracranial abnormality.

Paranasal sinus disease, as described.

Small-volume fluid within the right mastoid air cells.

## 2021-05-30 IMAGING — MR MR LUMBAR SPINE W/O CM
4 of 5 series · 20 of 48 positions shown · non-contrast
Comparison: None.

CLINICAL DATA: Acute lumbar myelopathy.

EXAM:
MRI LUMBAR SPINE WITHOUT CONTRAST
TECHNIQUE: Multiplanar, multisequence MR imaging of the lumbar spine was
performed. No intravenous contrast was administered.

[Series 3: T2 · sagittal · 4.0mm · 0.55mm/px · 5 of 15 slices shown (1 of 2)]
[im 1/15]
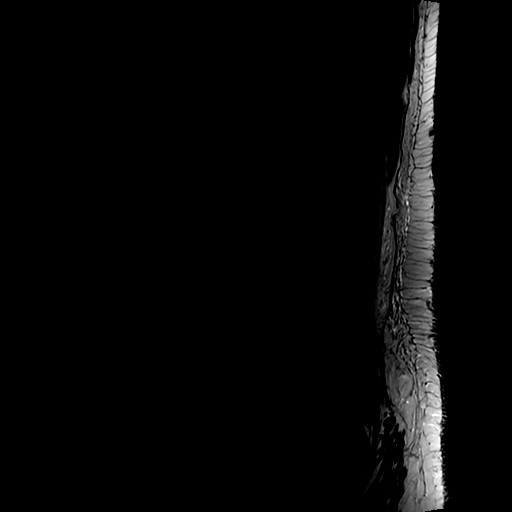
[im 4/15]
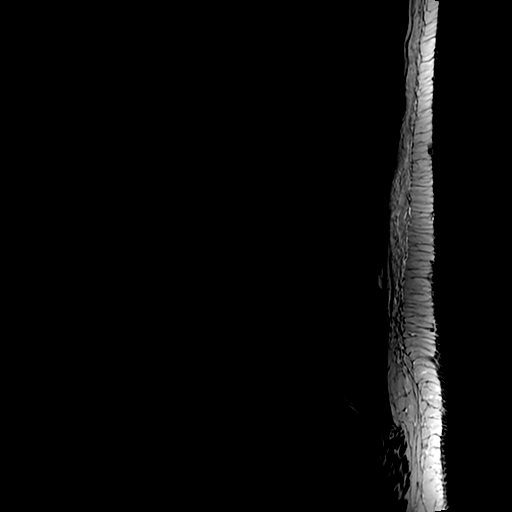
[im 8/15]
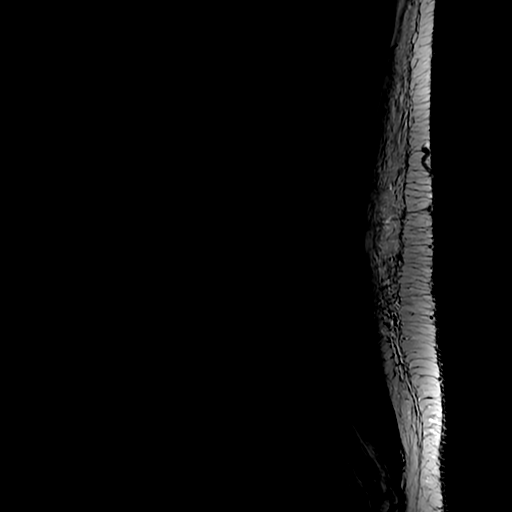
[im 11/15]
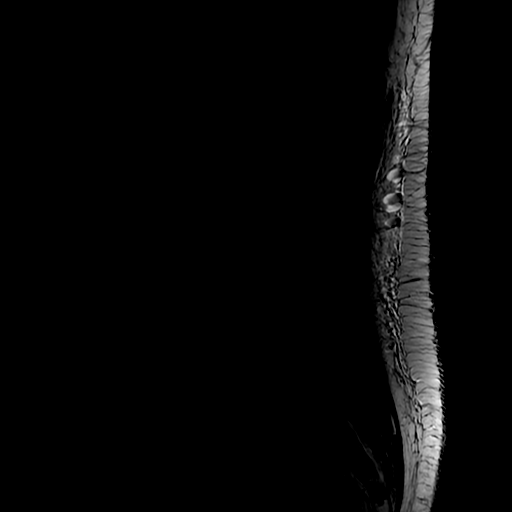
[im 15/15]
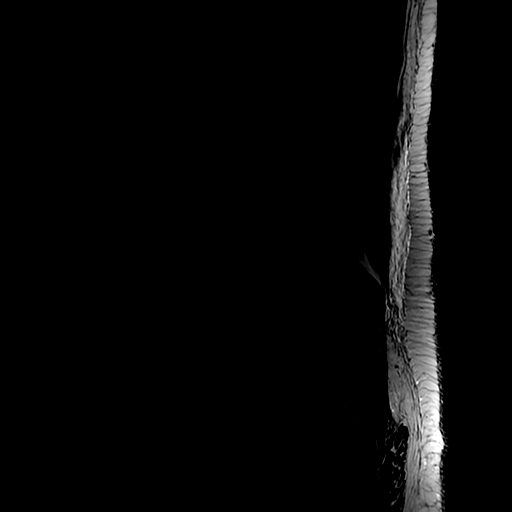

[Series 5: T1 · sagittal · 4.0mm · 0.55mm/px · 3 of 15 slices shown (1 of 2)]
[im 3/15]
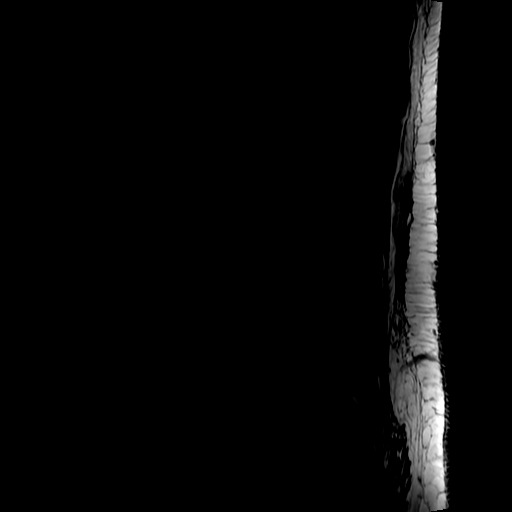
[im 9/15]
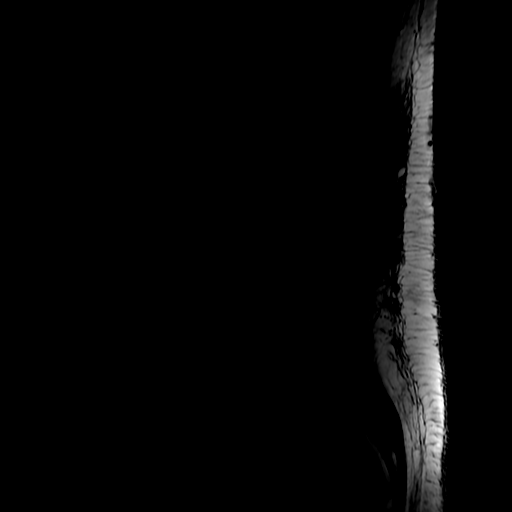
[im 15/15]
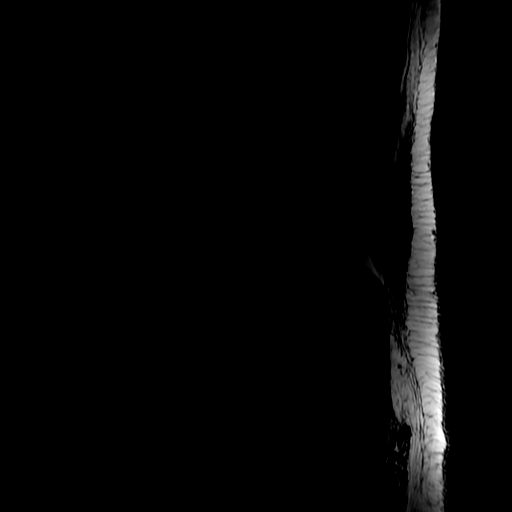

[Series 6: T2 · axial · 4.0mm · 0.39mm/px · z∈[-606,-441]mm · 9 of 42 slices shown (2 of 2)]
[im 3/42]
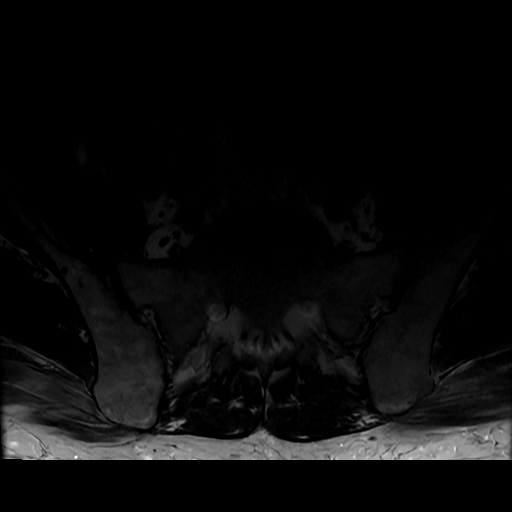
[im 6/42]
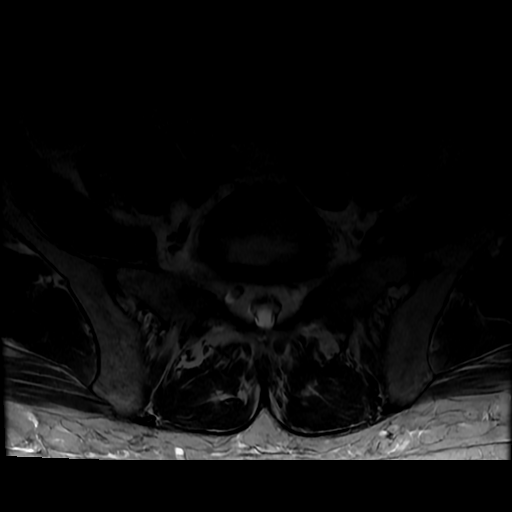
[im 9/42]
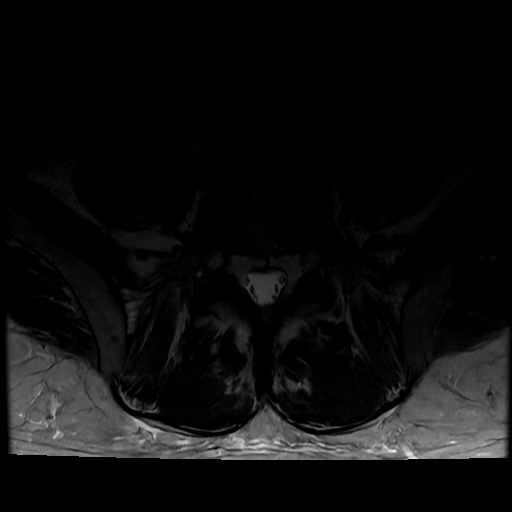
[im 14/42]
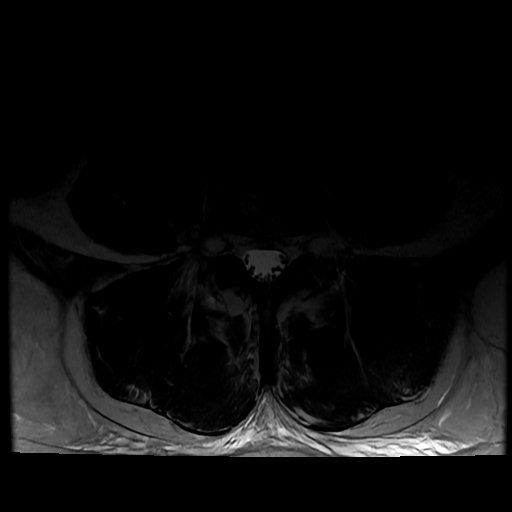
[im 20/42]
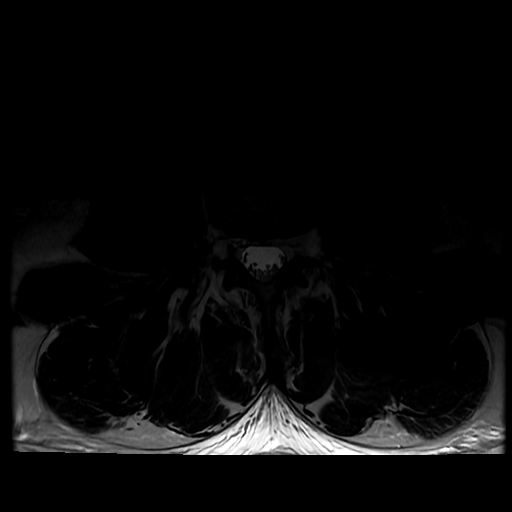
[im 22/42]
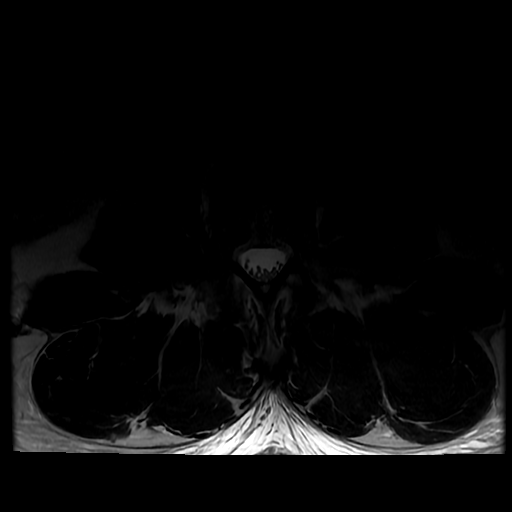
[im 25/42]
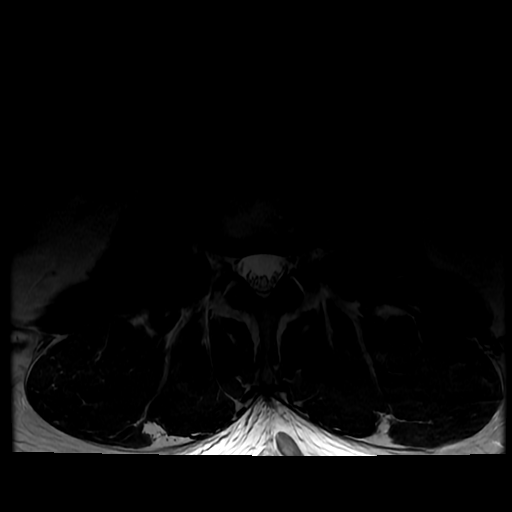
[im 31/42]
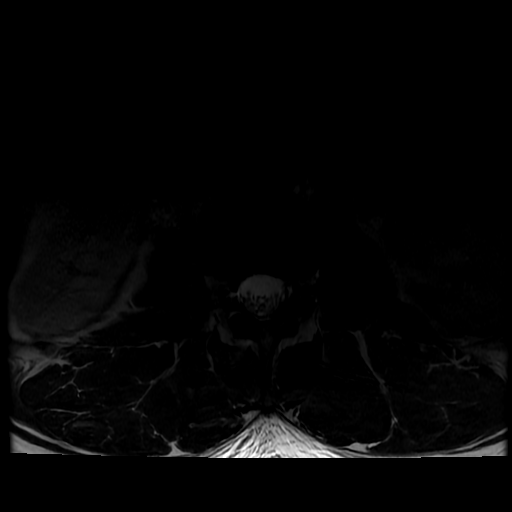
[im 36/42]
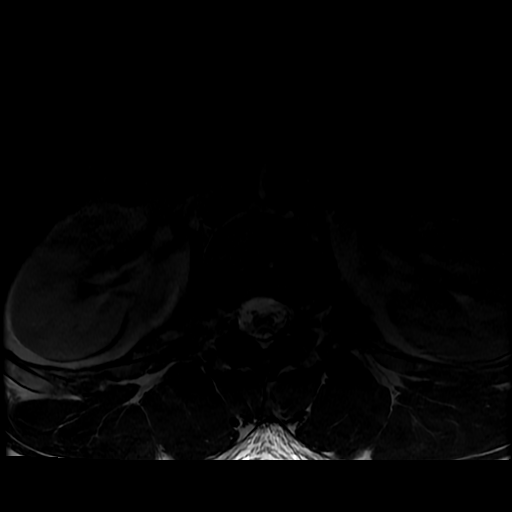

[Series 7: T1 · axial · 4.0mm · 0.39mm/px · z∈[-591,-441]mm · 3 of 42 slices shown (2 of 2)]
[im 6/42]
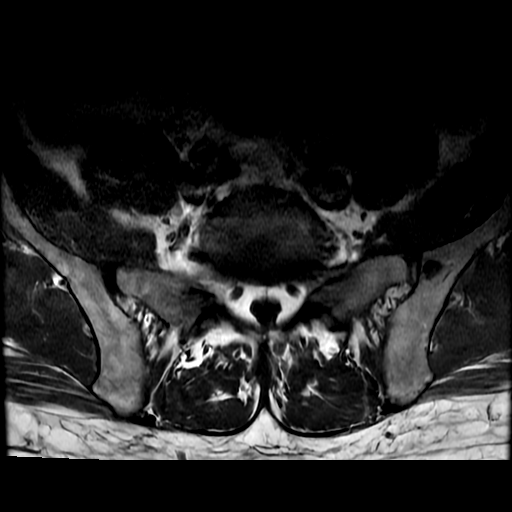
[im 22/42]
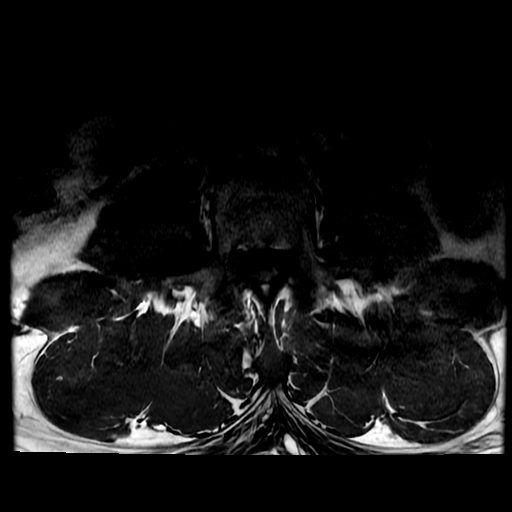
[im 36/42]
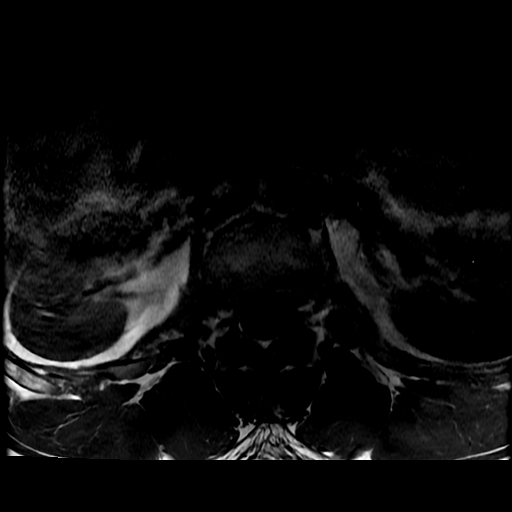

[20 of 48 positions shown; findings below may reference images not displayed]

FINDINGS: Segmentation:  Standard.

Alignment:  Physiologic.

Vertebrae:  No fracture, evidence of discitis, or bone lesion.

Conus medullaris and cauda equina: Conus extends to the L1 level.
Conus and cauda equina appear normal.

Paraspinal and other soft tissues: Negative.

Disc levels:

No spinal canal or neural foraminal stenosis
IMPRESSION: Normal MRI of the lumbar spine.

## 2021-05-30 IMAGING — DX DG CHEST 2V
2 series · 2 of 2 positions shown · non-contrast
Comparison: None.

CLINICAL DATA: Bilateral leg weakness

EXAM:
CHEST - 2 VIEW

[w chest ap]
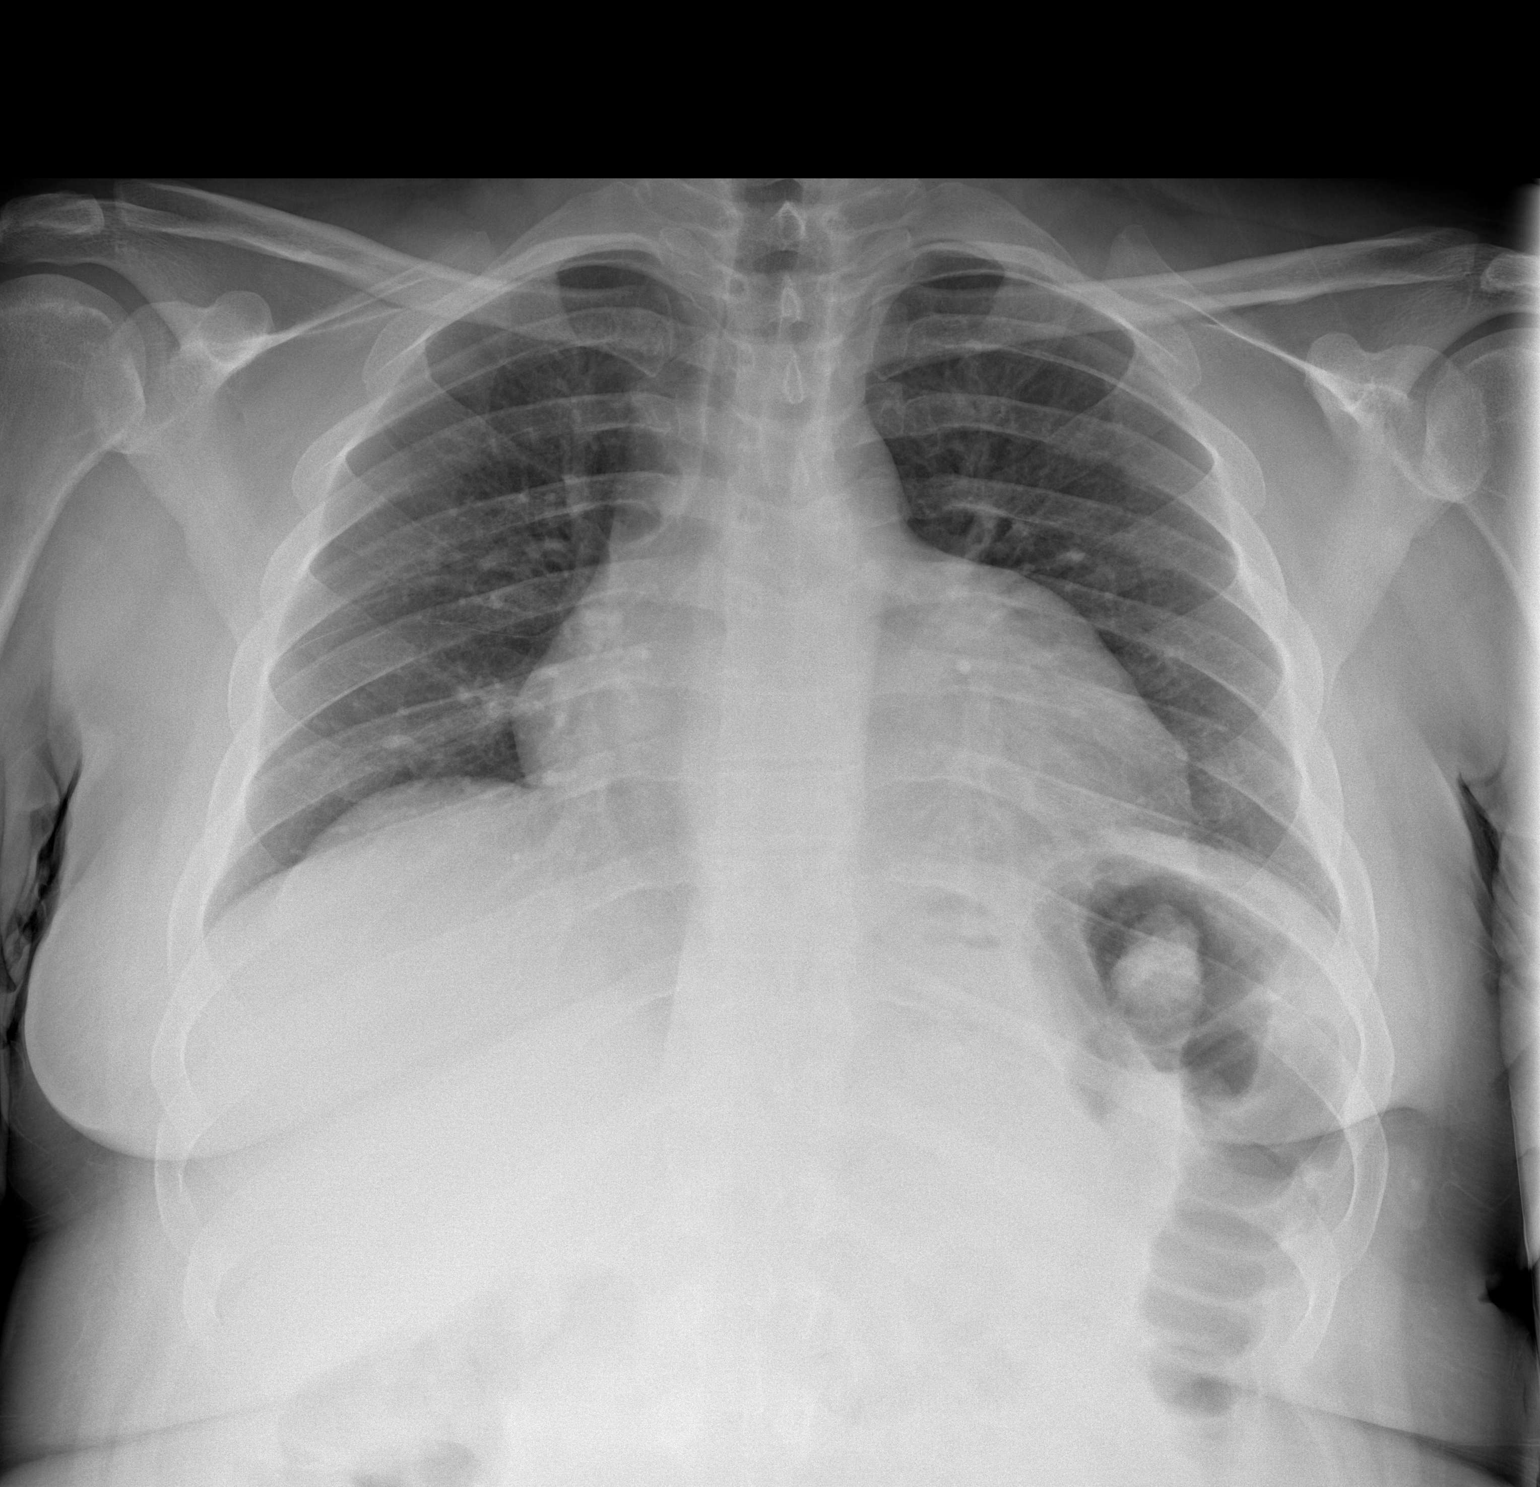

[w chest lat]
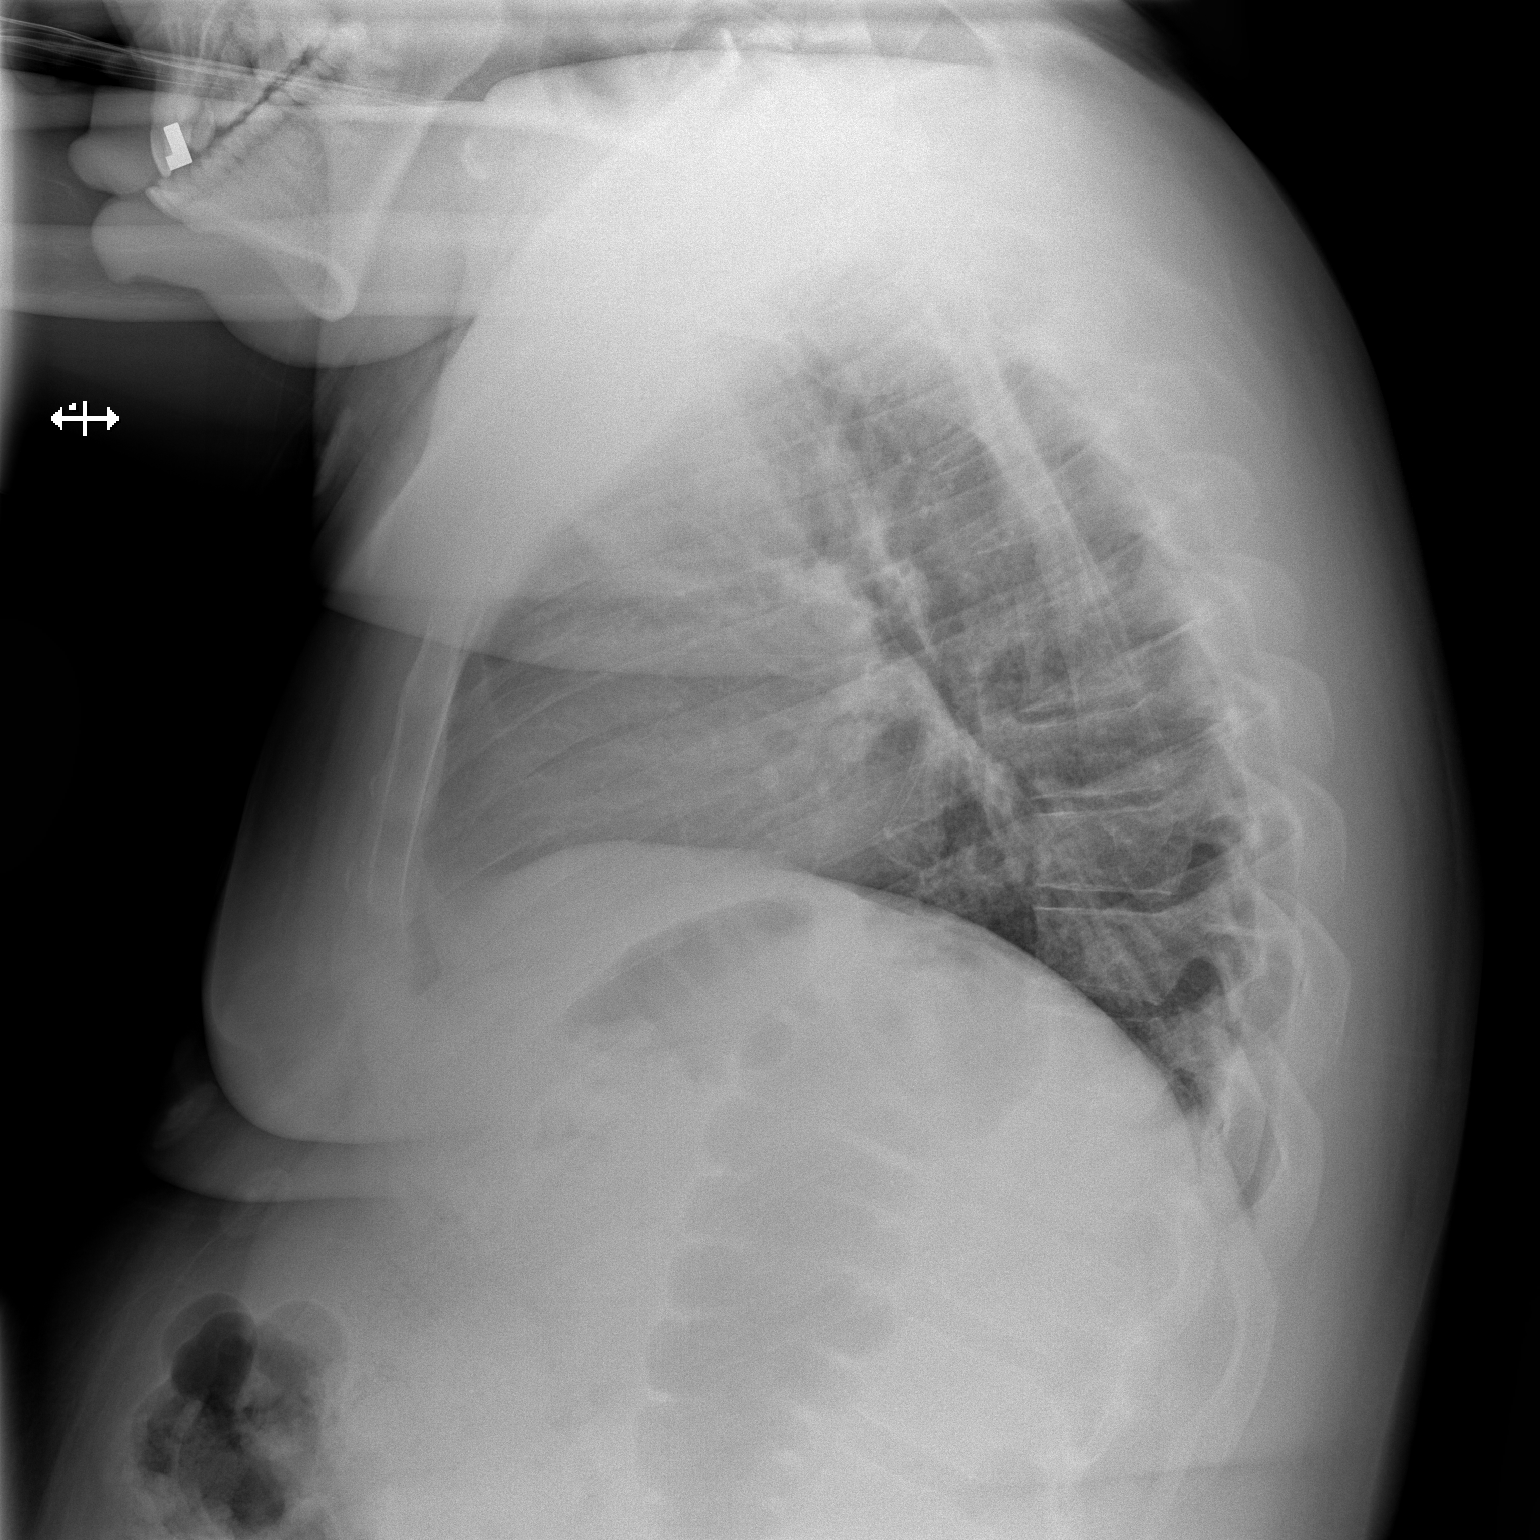

[2 of 2 positions shown; findings below may reference images not displayed]

FINDINGS: Cardiac and mediastinal contours are likely within normal limits
given low lung volumes and AP technique. No focal pulmonary opacity.
No pleural effusion or pneumothorax. No acute osseous abnormality.
IMPRESSION: No acute cardiopulmonary process.

## 2021-05-30 IMAGING — MR MR HEAD W/O CM
6 of 11 series · 24 of 48 positions shown · non-contrast
Comparison: Non-contrast head CT performed earlier today
[DATE].

CLINICAL DATA: Provided history: Neuro deficit, acute, stroke
suspected.

EXAM:
MRI HEAD WITHOUT CONTRAST
TECHNIQUE: Multiplanar, multiecho pulse sequences of the brain and surrounding
structures were obtained without intravenous contrast.

[Series 2: DWI · axial · 3.0mm · 0.94mm/px · z∈[-89,+55]mm · 7 of 99 slices shown (1 of 2)]
[im 1/99]
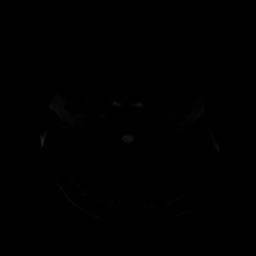
[im 17/99]
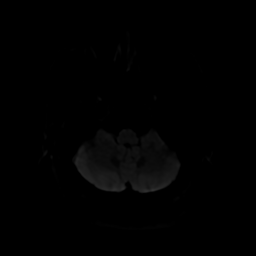
[im 33/99]
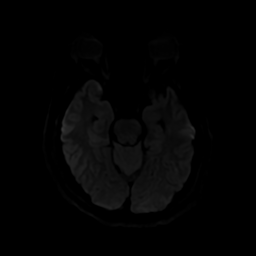
[im 50/99]
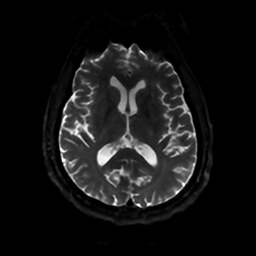
[im 66/99]
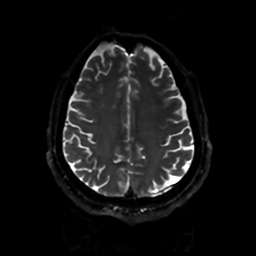
[im 82/99]
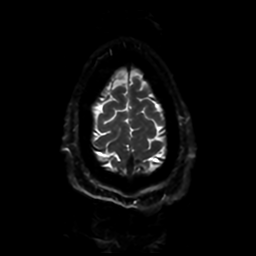
[im 99/99]
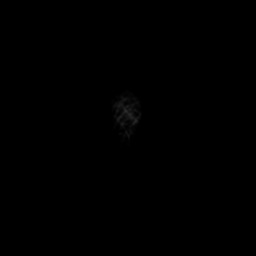

[Series 3: DWI · coronal · 4.0mm · 0.94mm/px · 5 of 72 slices shown (2 of 2)]
[im 1/72]
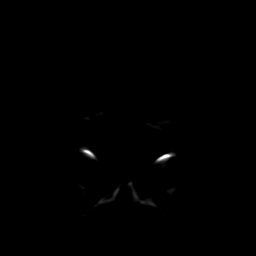
[im 18/72]
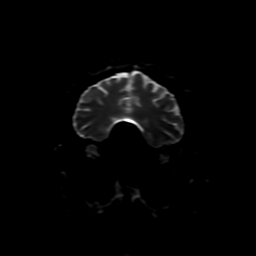
[im 36/72]
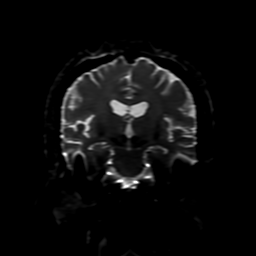
[im 54/72]
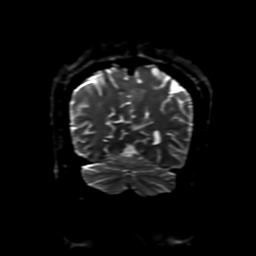
[im 72/72]
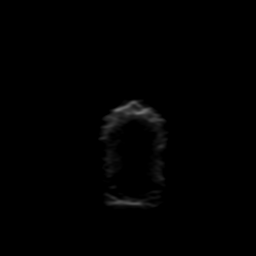

[Series 4: FLAIR · sagittal · 5.0mm · 0.23mm/px · 2 of 28 slices shown (1 of 2)]
[im 1/28]
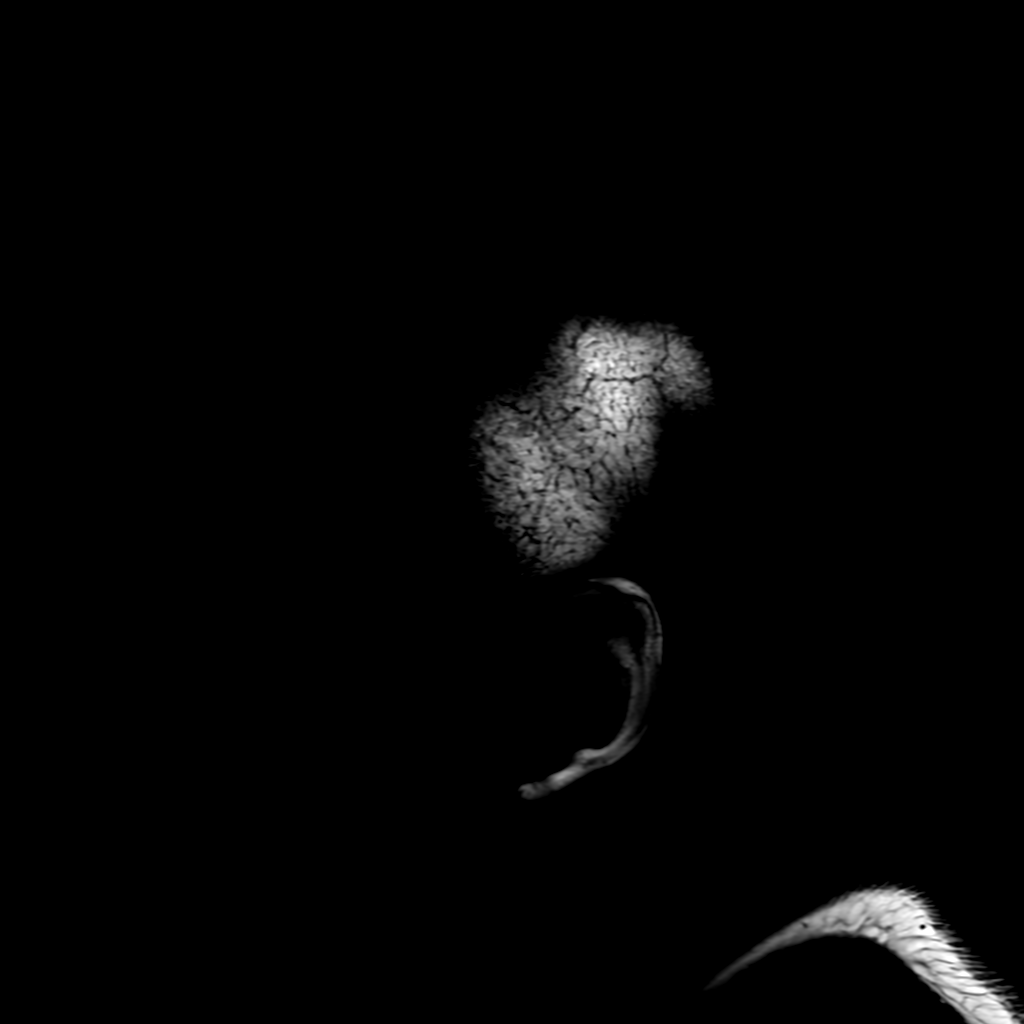
[im 28/28]
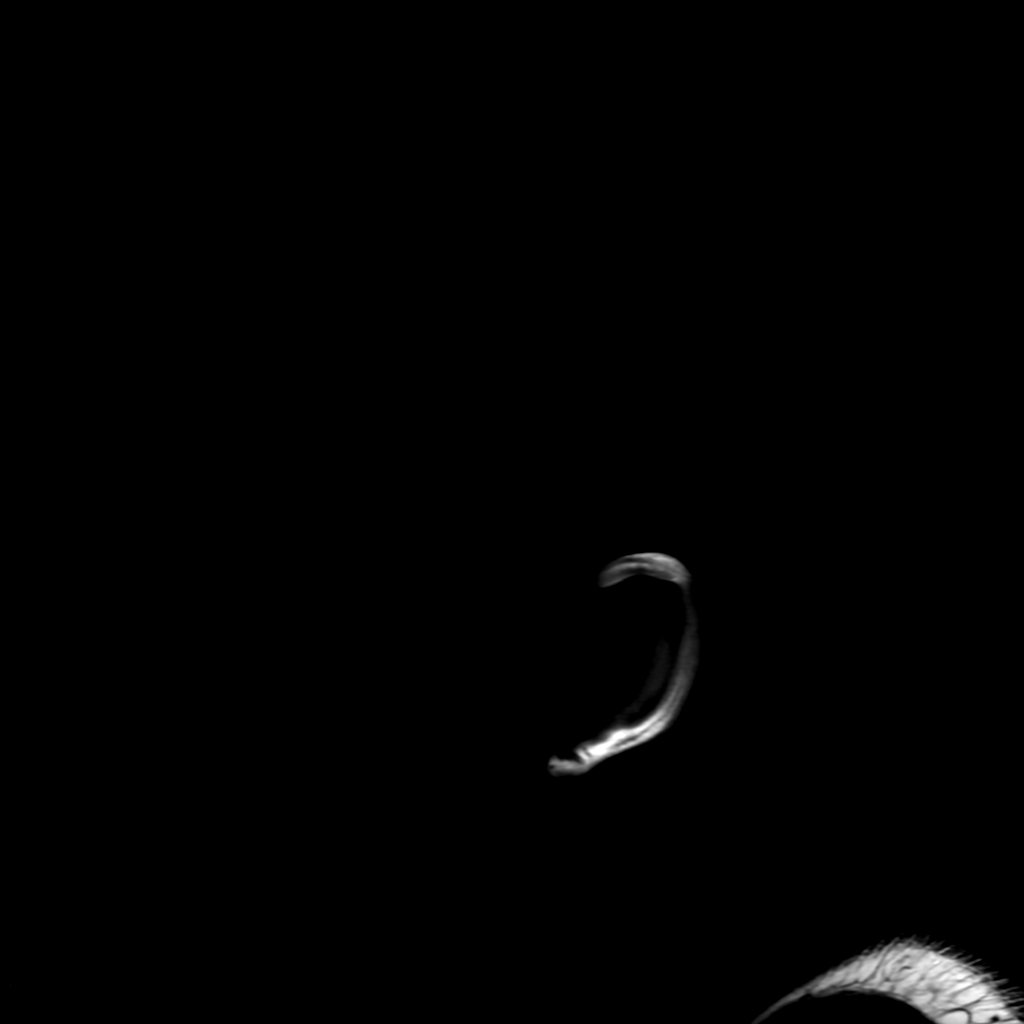

[Series 6: FLAIR · axial · 4.0mm · 0.45mm/px · z∈[-94,+53]mm · 3 of 35 slices shown (2 of 2)]
[im 1/35]
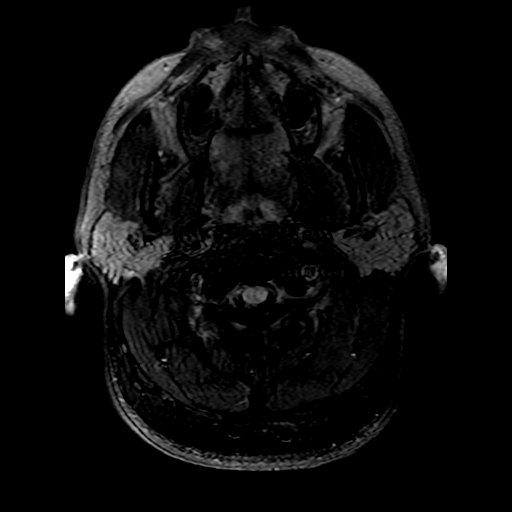
[im 18/35]
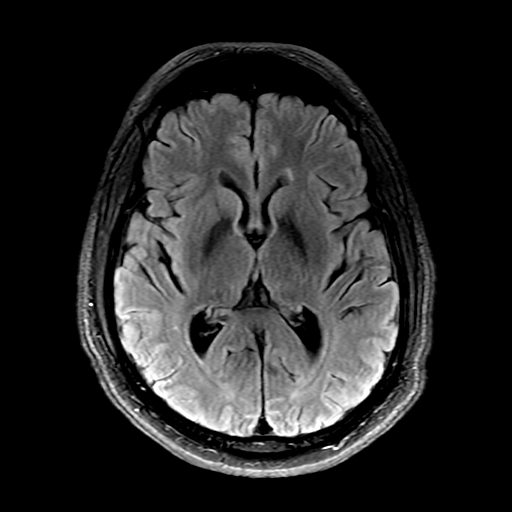
[im 35/35]
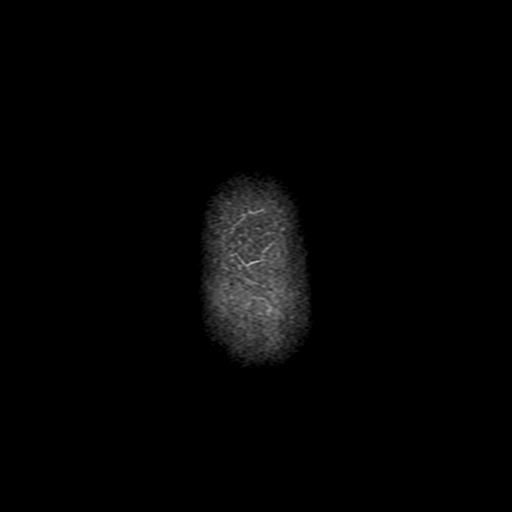

[Series 250: ADC · axial · 3.0mm · 0.94mm/px · z∈[-89,+55]mm · 4 of 49 slices shown (1 of 2)]
[im 1/49]
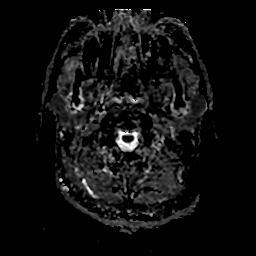
[im 17/49]
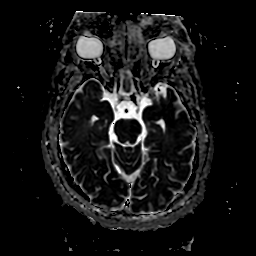
[im 33/49]
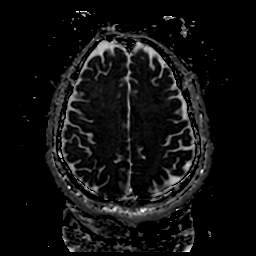
[im 49/49]
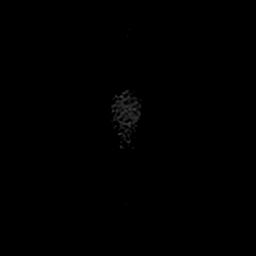

[Series 350: ADC · coronal · 4.0mm · 0.94mm/px · 3 of 34 slices shown (2 of 2)]
[im 1/34]
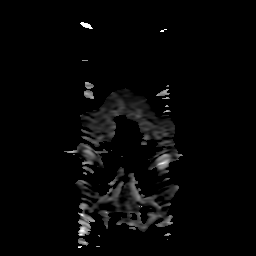
[im 17/34]
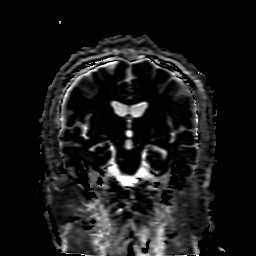
[im 34/34]
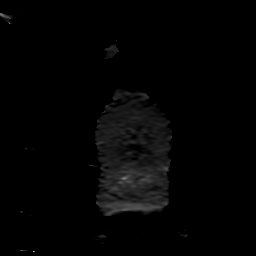

[24 of 48 positions shown; findings below may reference images not displayed]

FINDINGS: Brain:

Intermittently motion degraded examination. Most notably, there is
moderate motion degradation of the coronal T2 TSE sequence.

Cerebral volume is normal.

There is a 6 mm focus of diffusion-weighted and T2 FLAIR
hyperintense signal abnormality within the medial aspect of the mid
right frontal lobe (series 2, images 32 and 33). There is
corresponding intermediate signal at this site on the ADC map, and
this is suspicious for a small early subacute infarct.

Background multifocal T2 FLAIR hyperintense signal abnormality
within the cerebral white matter, mild but advanced for age.

No evidence of an intracranial mass.

No chronic intracranial blood products.

No extra-axial fluid collection.

No midline shift.

Vascular: Maintained flow voids within the proximal large arterial
vessels.

Skull and upper cervical spine: No focal suspicious marrow lesion.

Sinuses/Orbits: Visualized orbits show no acute finding. Small
mucous retention cysts within the right maxillary sinus. Trace
mucosal thickening within the bilateral ethmoid sinuses. Small
mucous retention cyst within the right frontal sinus.

Other: Small right mastoid effusion. Trace fluid also present within
the left mastoid air cells.
IMPRESSION: Intermittently motion degraded examination.

6 mm focus of diffusion-weighted and T2 FLAIR hyperintense signal
abnormality within the medial aspect of the mid right frontal lobe,
as described and suspicious for a small early subacute infarct.

Background mild multifocal T2 FLAIR intense signal abnormality
within the cerebral white matter, overall mild but advanced for age.
These signal changes are nonspecific, but likely reflect sequela of
chronic small vessel ischemic disease given the patient's history of
diabetes, hypertension and hyperlipidemia.

Paranasal sinus disease, as described.

Small right mastoid effusion. Trace fluid also present within the
left mastoid air cells.

## 2021-05-30 MED ORDER — FENTANYL CITRATE PF 50 MCG/ML IJ SOSY
50.0000 ug | PREFILLED_SYRINGE | Freq: Once | INTRAMUSCULAR | Status: AC
  Administered 2021-05-30: 50 ug via INTRAVENOUS
  Filled 2021-05-30: qty 1

## 2021-05-30 MED ORDER — METOCLOPRAMIDE HCL 5 MG/ML IJ SOLN
10.0000 mg | Freq: Once | INTRAMUSCULAR | Status: AC
  Administered 2021-05-30: 10 mg via INTRAVENOUS
  Filled 2021-05-30: qty 2

## 2021-05-30 MED ORDER — LORAZEPAM 2 MG/ML IJ SOLN
1.0000 mg | Freq: Once | INTRAMUSCULAR | Status: AC
  Administered 2021-05-30: 1 mg via INTRAVENOUS
  Filled 2021-05-30: qty 1

## 2021-05-30 MED ORDER — SODIUM CHLORIDE 0.9 % IV SOLN: INTRAVENOUS | Status: DC

## 2021-05-30 NOTE — ED Notes (Signed)
Pt requested to use BR for urine and BM. Pt assisted to BR in wheelchair at this time.

## 2021-05-30 NOTE — ED Triage Notes (Signed)
Patient c/o bilateral leg weakness Friday when getting up to walk. Reports feeling wobbly and having difficulty ambulating. C/o dizziness when in the car.

## 2021-05-30 NOTE — ED Notes (Signed)
Per pt weakness in bilat lower leg pain, and a headache since Friday.

## 2021-05-30 NOTE — ED Provider Notes (Signed)
[facility] EMERGENCY DEPARTMENT Provider Note   CSN: [id number] Arrival date & time: 05/30/21  1519     History  Chief Complaint  Patient presents with   Weakness    Victor Bryan is a 41 y.o. male.  41 year old male presents with progressive weakness in his lower legs times several days.  Patient states has had trouble ambulating.  Has had some low back pain as well 2.  Denies any fever or chills.  No bowel or bladder dysfunction.  Denies any saddle anesthesias.  No history of IV drug use.  Does have a history of A-fib states he is not on blood thinners at this time.  Denies any chest pain or chest pressure.  History of chronic kidney disease and states his creatinine is normally around 2.  Moved to the area just 3 days ago      Home Medications Prior to Admission medications   Not on File      Allergies    Patient has no known allergies.    Review of Systems   Review of Systems  All other systems reviewed and are negative.  Physical Exam Updated Vital Signs BP (!) 153/103    Pulse 88    Temp 98.7 F (37.1 C)    Resp 16    SpO2 100%  Physical Exam Vitals and nursing note reviewed.  Constitutional:      General: He is not in acute distress.    Appearance: Normal appearance. He is well-developed. He is not toxic-appearing.  HENT:     Head: Normocephalic and atraumatic.  Eyes:     General: Lids are normal.     Conjunctiva/sclera: Conjunctivae normal.     Pupils: Pupils are equal, round, and reactive to light.  Neck:     Thyroid: No thyroid mass.     Trachea: No tracheal deviation.  Cardiovascular:     Rate and Rhythm: Normal rate and regular rhythm.     Heart sounds: Normal heart sounds. No murmur heard.   No gallop.  Pulmonary:     Effort: Pulmonary effort is normal. No respiratory distress.     Breath sounds: Normal breath sounds. No stridor. No decreased breath sounds, wheezing, rhonchi or rales.  Abdominal:     General: There is no  distension.     Palpations: Abdomen is soft.     Tenderness: There is no abdominal tenderness. There is no rebound.  Musculoskeletal:        General: No tenderness. Normal range of motion.     Cervical back: Normal range of motion and neck supple.  Skin:    General: Skin is warm and dry.     Findings: No abrasion or rash.  Neurological:     Mental Status: He is alert and oriented to person, place, and time. Mental status is at baseline.     GCS: GCS eye subscore is 4. GCS verbal subscore is 5. GCS motor subscore is 6.     Cranial Nerves: No cranial nerve deficit.     Sensory: No sensory deficit.     Motor: Weakness present.     Comments: Strength is 3/5 bilaterally Attempted to stand patient up but he was unable to do this.  Psychiatric:        Attention and Perception: Attention normal.        Speech: Speech normal.        Behavior: Behavior normal.    ED Results / Procedures / Treatments  Labs (all labs ordered are listed, but only abnormal results are displayed) Labs Reviewed  BASIC METABOLIC PANEL - Abnormal; Notable for the following components:      Result Value   Glucose, Bld 223 (*)    Creatinine, Ser 2.41 (*)    GFR, Estimated 34 (*)    All other components within normal limits  CBC WITH DIFFERENTIAL/PLATELET - Abnormal; Notable for the following components:   MCV 77.5 (*)    MCH 25.4 (*)    All other components within normal limits  TROPONIN I (HIGH SENSITIVITY) - Abnormal; Notable for the following components:   Troponin I (High Sensitivity) 35 (*)    All other components within normal limits  RESP PANEL BY RT-PCR (FLU A&B, COVID) [id number]  TROPONIN I (HIGH SENSITIVITY)    EKG EKG Interpretation  Date/Time:  Tuesday May 30 2021 16:36:32 EST Ventricular Rate:  90 PR Interval:  140 QRS Duration: 90 QT Interval:  394 QTC Calculation: 481 R Axis:   83 Text Interpretation: Normal sinus rhythm Cannot rule out Inferior infarct , age undetermined ST & T  wave abnormality, consider lateral ischemia Abnormal ECG No previous ECGs available Confirmed by Lacretia Leigh ([id number]) on 05/30/2021 6:05:43 PM  Radiology DG Chest 2 View  Result Date: 05/30/2021 CLINICAL DATA:  Bilateral leg weakness EXAM: CHEST - 2 VIEW COMPARISON:  None. FINDINGS: Cardiac and mediastinal contours are likely within normal limits given low lung volumes and AP technique. No focal pulmonary opacity. No pleural effusion or pneumothorax. No acute osseous abnormality. IMPRESSION: No acute cardiopulmonary process. Electronically Signed   By: Merilyn Baba M.D.   On: 05/30/2021 17:19   CT Head Wo Contrast  Result Date: 05/30/2021 CLINICAL DATA:  Provided history: Dizziness, persistent/recurrent, cardiac or vascular cause suspected. Additional history provided: Patient reports dizziness, headache, worsening bilateral leg weakness for 2 days. EXAM: CT HEAD WITHOUT CONTRAST TECHNIQUE: Contiguous axial images were obtained from the base of the skull through the vertex without intravenous contrast. RADIATION DOSE REDUCTION: This exam was performed according to the departmental dose-optimization program which includes automated exposure control, adjustment of the mA and/or kV according to patient size and/or use of iterative reconstruction technique. COMPARISON:  No pertinent prior exams available for comparison. FINDINGS: Brain: Small circumscribed hypodensity within the inferior right basal ganglia, likely reflecting a prominent perivascular space. There is no acute intracranial hemorrhage. No demarcated cortical infarct. No extra-axial fluid collection. No evidence of an intracranial mass. No midline shift. Vascular: No hyperdense vessel. Skull: Normal. Negative for fracture or focal lesion. Sinuses/Orbits: Visualized orbits show no acute finding. 17 mm mucous retention cyst within the right maxillary sinus. Small mucous retention cyst within the left maxillary sinus. Small volume frothy secretions  within a posterior right ethmoid air cell. Small mucous retention cyst within the inferior right frontal sinus. Other: Small-volume fluid within the right mastoid air cells. Debris within the left external auditory canal. IMPRESSION: No evidence of acute intracranial abnormality. Paranasal sinus disease, as described. Small-volume fluid within the right mastoid air cells. Electronically Signed   By: Kellie Simmering D.O.   On: 05/30/2021 17:31    Procedures Procedures    Medications Ordered in ED Medications  0.9 %  sodium chloride infusion (has no administration in time range)    ED Course/ Medical Decision Making/ A&P                           Medical Decision Making Amount  and/or Complexity of Data Reviewed Radiology: ordered.  Risk Prescription drug management. Decision regarding hospitalization.  Patient is a from interpretation shows sinus rhythm. Patient presented with lower extremity weakness as well as back pain.  No bowel or bladder dysfunction but concern for possible spinal cord involvement.  Also concern for possible central process.  Head CT performed per my view and interpretation was negative.  Patient had MRI of his brain as well as his thoracic and lumbar spine.  MRI brain per my evaluation did show evidence of CVA.  Discussed with neurology who recommends hospitalist admission.  Discussed with hospitalist was going to admit the patient CRITICAL CARE Performed by: Leota Jacobsen Total critical care time: 45 minutes Critical care time was exclusive of separately billable procedures and treating other patients. Critical care was necessary to treat or prevent imminent or life-threatening deterioration. Critical care was time spent personally by me on the following activities: development of treatment plan with patient and/or surrogate as well as nursing, discussions with consultants, evaluation of patient's response to treatment, examination of patient, obtaining history from  patient or surrogate, ordering and performing treatments and interventions, ordering and review of laboratory studies, ordering and review of radiographic studies, pulse oximetry and re-evaluation of patient's condition.         Final Clinical Impression(s) / ED Diagnoses Final diagnoses:  None    Rx / DC Orders ED Discharge Orders     None         Lacretia Leigh, MD 06/01/21 1901

## 2021-05-30 NOTE — ED Notes (Signed)
Pt requesting pain meds due to his head. Provider sent a message

## 2021-05-30 NOTE — ED Notes (Signed)
Received verbal report from Cyndia Skeeters RN at this time

## 2021-05-30 NOTE — ED Notes (Signed)
Transported to MRI

## 2021-05-30 NOTE — ED Provider Triage Note (Signed)
Emergency Medicine Provider Triage Evaluation Note  Inaki Vantine , a 41 y.o. male  was evaluated in triage.  Pt complains of weakness.  Started 4 days ago, feels like both his legs are wobbly.  When he turns his head too fast he feels dizzy and lightheaded.  Denies any specific chest pain.  No syncopal events.  No focal weakness or lateralizing of his symptoms.  He does endorse a migraine, no nausea or vomiting and he has had history of headaches before..  Review of Systems  Positive: WEAKNESS Negative: CHEST PAIN  Physical Exam  BP (!) 153/86    Pulse 92    Temp 98.7 F (37.1 C)    Resp 20    SpO2 100%  Gen:   Awake, no distress   Resp:  Normal effort  MSK:   Moves extremities without difficulty  Other:  Grip strength equal bilaterally, no pronator drift.  Able to raise both lower extremities.  Medical Decision Making  Medically screening exam initiated at 4:34 PM.  Appropriate orders placed.  Ashby Moskal was informed that the remainder of the evaluation will be completed by another provider, this initial triage assessment does not replace that evaluation, and the importance of remaining in the ED until their evaluation is complete.     Sherrill Raring, PA-C 05/30/21 1634

## 2021-05-30 NOTE — ED Notes (Signed)
At bedside with pt attempting to do assessment but pt is difficult to wake up and will not stay awake to answer questions. ED provider at bedside and advised pt that he had a CVA.

## 2021-05-31 ENCOUNTER — Observation Stay (HOSPITAL_BASED_OUTPATIENT_CLINIC_OR_DEPARTMENT_OTHER): Payer: Medicare (Managed Care)

## 2021-05-31 ENCOUNTER — Other Ambulatory Visit (HOSPITAL_COMMUNITY): Payer: Self-pay

## 2021-05-31 ENCOUNTER — Observation Stay (HOSPITAL_COMMUNITY): Payer: Medicare (Managed Care)

## 2021-05-31 DIAGNOSIS — N179 Acute kidney failure, unspecified: Secondary | ICD-10-CM | POA: Diagnosis not present

## 2021-05-31 DIAGNOSIS — Z794 Long term (current) use of insulin: Secondary | ICD-10-CM

## 2021-05-31 DIAGNOSIS — I10 Essential (primary) hypertension: Secondary | ICD-10-CM | POA: Diagnosis not present

## 2021-05-31 DIAGNOSIS — R778 Other specified abnormalities of plasma proteins: Secondary | ICD-10-CM | POA: Diagnosis not present

## 2021-05-31 DIAGNOSIS — G4733 Obstructive sleep apnea (adult) (pediatric): Secondary | ICD-10-CM

## 2021-05-31 DIAGNOSIS — I6389 Other cerebral infarction: Secondary | ICD-10-CM

## 2021-05-31 DIAGNOSIS — I639 Cerebral infarction, unspecified: Secondary | ICD-10-CM

## 2021-05-31 DIAGNOSIS — E118 Type 2 diabetes mellitus with unspecified complications: Secondary | ICD-10-CM

## 2021-05-31 DIAGNOSIS — I480 Paroxysmal atrial fibrillation: Secondary | ICD-10-CM | POA: Diagnosis not present

## 2021-05-31 DIAGNOSIS — R531 Weakness: Secondary | ICD-10-CM

## 2021-05-31 DIAGNOSIS — G459 Transient cerebral ischemic attack, unspecified: Secondary | ICD-10-CM | POA: Diagnosis not present

## 2021-05-31 LAB — CBC
HCT: 38 % — ABNORMAL LOW (ref 39.0–52.0)
Hemoglobin: 13.1 g/dL (ref 13.0–17.0)
MCH: 26.3 pg (ref 26.0–34.0)
MCHC: 34.5 g/dL (ref 30.0–36.0)
MCV: 76.3 fL — ABNORMAL LOW (ref 80.0–100.0)
Platelets: 266 10*3/uL (ref 150–400)
RBC: 4.98 MIL/uL (ref 4.22–5.81)
RDW: 13.2 % (ref 11.5–15.5)
WBC: 9.4 10*3/uL (ref 4.0–10.5)
nRBC: 0 % (ref 0.0–0.2)

## 2021-05-31 LAB — CBG MONITORING, ED
Glucose-Capillary: 171 mg/dL — ABNORMAL HIGH (ref 70–99)
Glucose-Capillary: 172 mg/dL — ABNORMAL HIGH (ref 70–99)

## 2021-05-31 LAB — ECHOCARDIOGRAM COMPLETE
AR max vel: 2.66 cm2
AV Area VTI: 2.71 cm2
AV Area mean vel: 2.75 cm2
AV Mean grad: 3 mmHg
AV Peak grad: 4.7 mmHg
Ao pk vel: 1.08 m/s
Area-P 1/2: 4.8 cm2
Height: 69 in
S' Lateral: 2.9 cm
Weight: 4160 oz

## 2021-05-31 LAB — HEMOGLOBIN A1C
Hgb A1c MFr Bld: 7.8 % — ABNORMAL HIGH (ref 4.8–5.6)
Mean Plasma Glucose: 177.16 mg/dL

## 2021-05-31 LAB — HIV ANTIBODY (ROUTINE TESTING W REFLEX): HIV Screen 4th Generation wRfx: NONREACTIVE

## 2021-05-31 LAB — LIPID PANEL
Cholesterol: 211 mg/dL — ABNORMAL HIGH (ref 0–200)
HDL: 48 mg/dL (ref >40)
LDL Cholesterol: 143 mg/dL — ABNORMAL HIGH (ref 0–99)
Total CHOL/HDL Ratio: 4.4 RATIO
Triglycerides: 99 mg/dL (ref <150)
VLDL: 20 mg/dL (ref 0–40)

## 2021-05-31 LAB — TSH: TSH: 2.015 u[IU]/mL (ref 0.350–4.500)

## 2021-05-31 IMAGING — MR MR MRA HEAD W/O CM
1 series · 18 of 48 positions shown · IV contrast (gadavist)
Comparison: Brain MRI [DATE].

CLINICAL DATA: 40-year-old male with paroxysmal atrial
fibrillation. Neurologic deficit. MRI yesterday suggests subacute
white matter lacunar infarcts in the right frontal lobe.

EXAM:
MRA HEAD WITHOUT CONTRAST
MRA NECK WITHOUT AND WITH CONTRAST
TECHNIQUE: Angiographic images of the Circle of Willis were obtained using MRA
technique without intravenous contrast. Angiographic images of the
neck were obtained using MRA technique without and with intravenous
contrast. Carotid stenosis measurements (when applicable) are
obtained utilizing NASCET criteria, using the distal internal
carotid diameter as the denominator.
CONTRAST:  9.5mL GADAVIST GADOBUTROL 1 MMOL/ML IV SOLN

[Series 5: 3d cow · axial · 0.5mm · 0.41mm/px · z∈[-75,+14]mm · 18 of 188 slices shown]
[im 1/188]
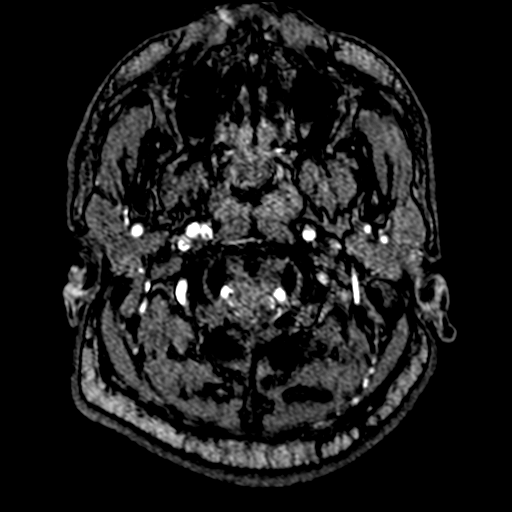
[im 4/188]
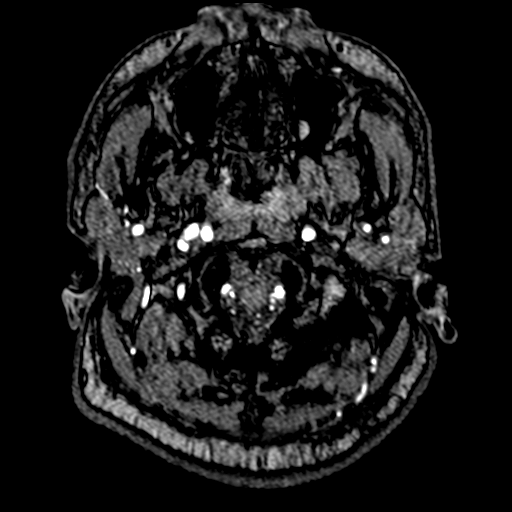
[im 8/188]
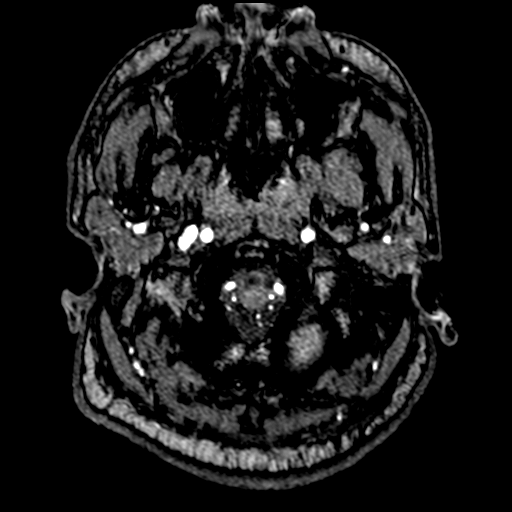
[im 12/188]
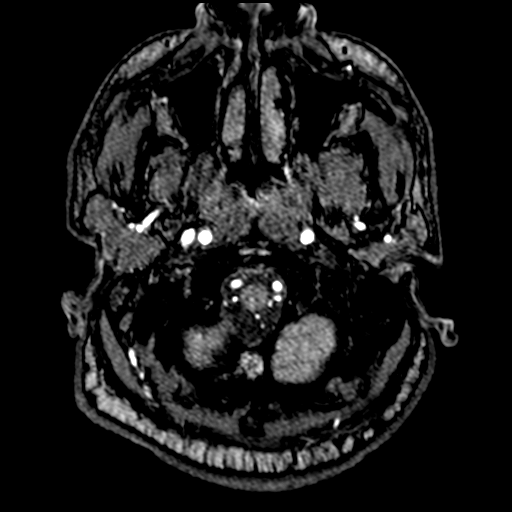
[im 16/188]
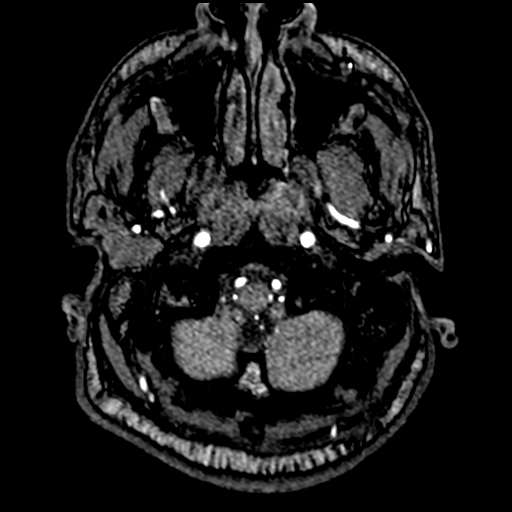
[im 20/188]
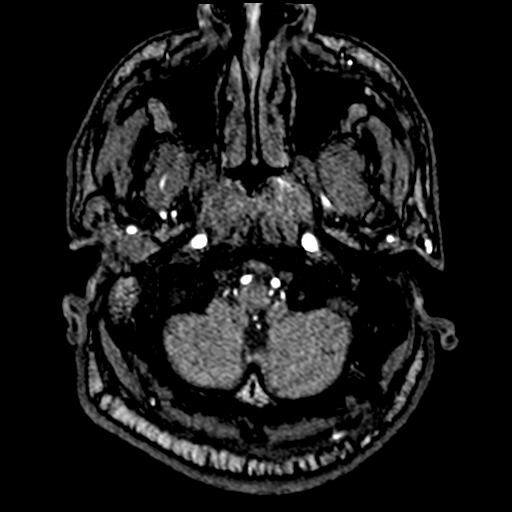
[im 24/188]
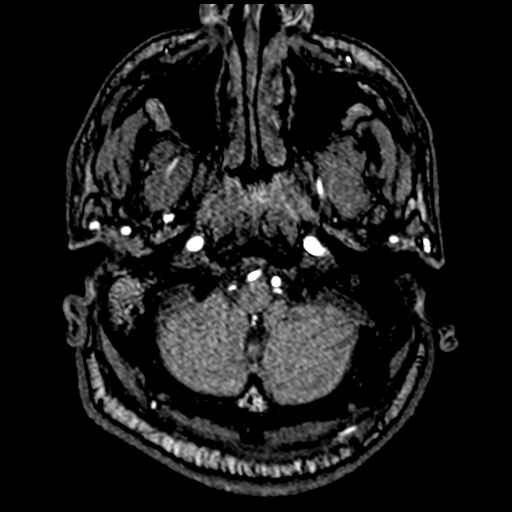
[im 28/188]
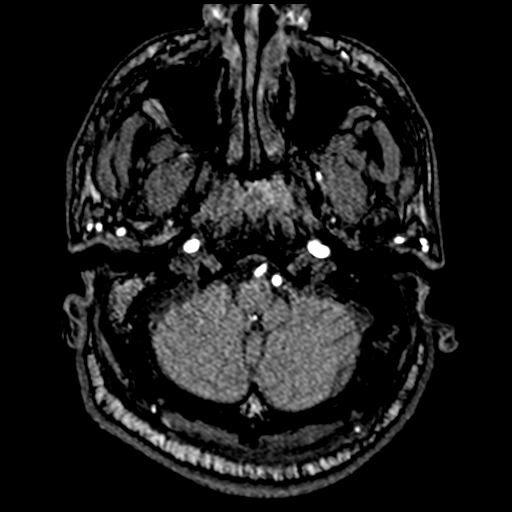
[im 32/188]
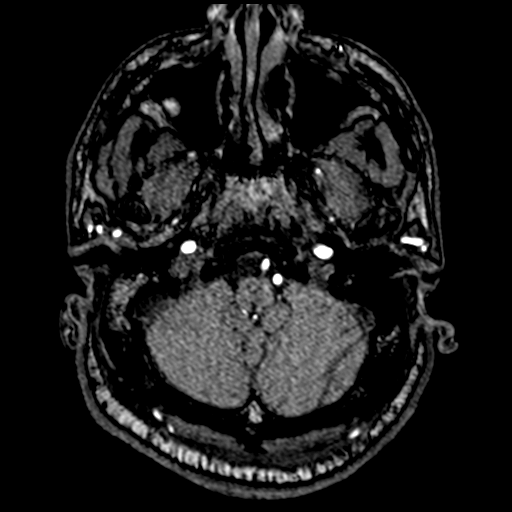
[im 36/188]
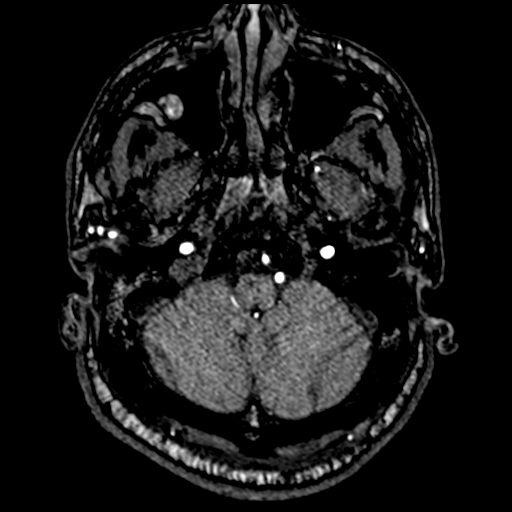
[im 60/188]
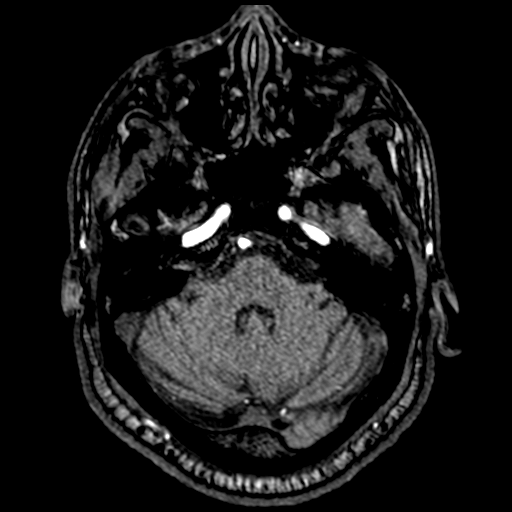
[im 84/188]
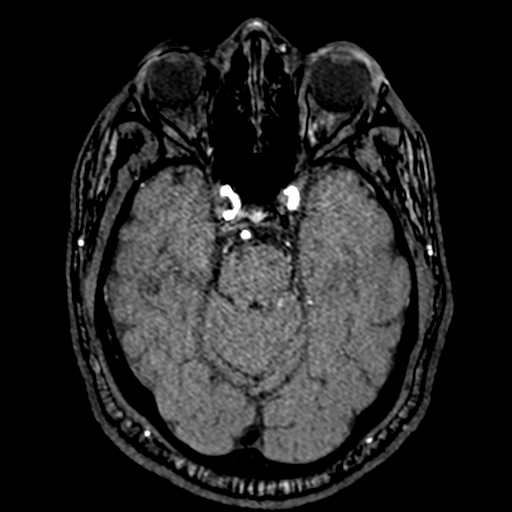
[im 96/188]
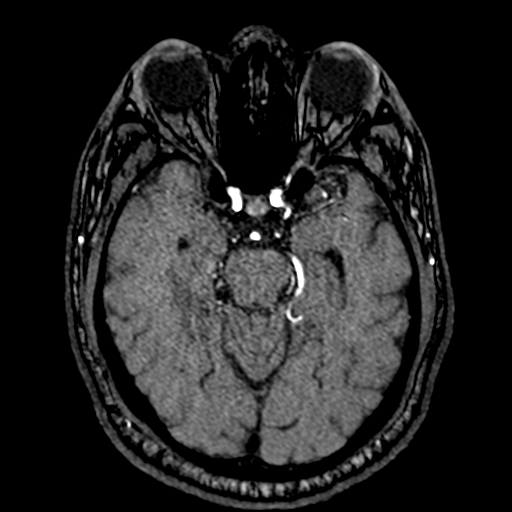
[im 108/188]
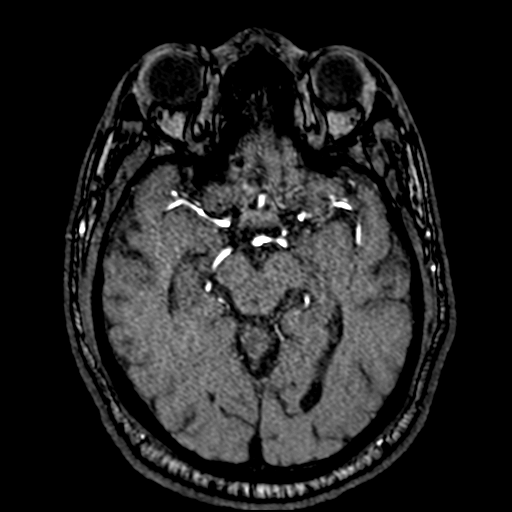
[im 132/188]
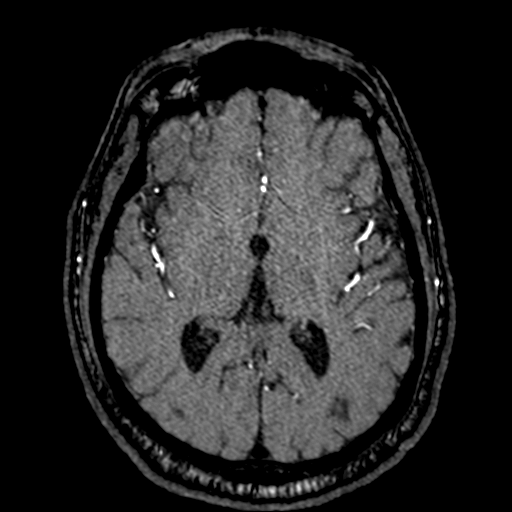
[im 156/188]
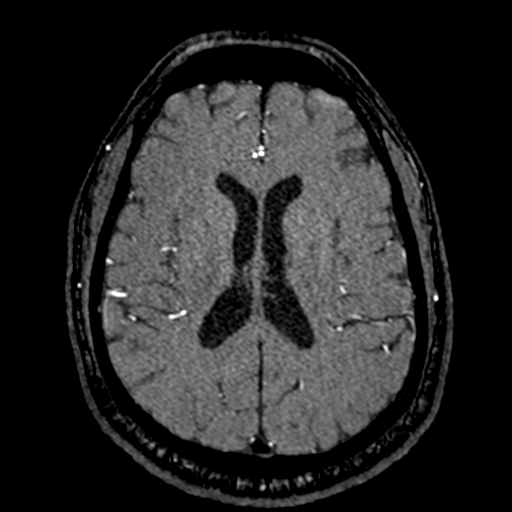
[im 160/188]
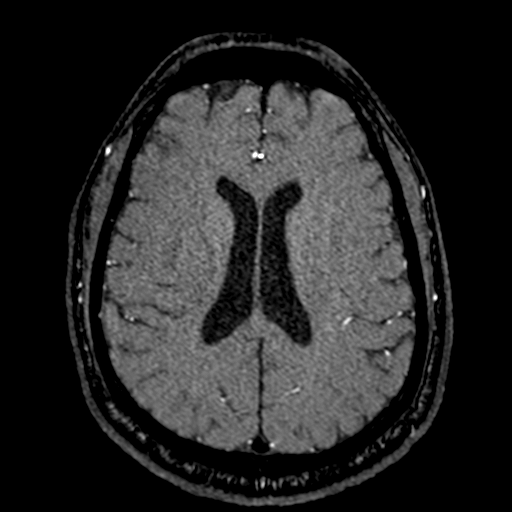
[im 180/188]
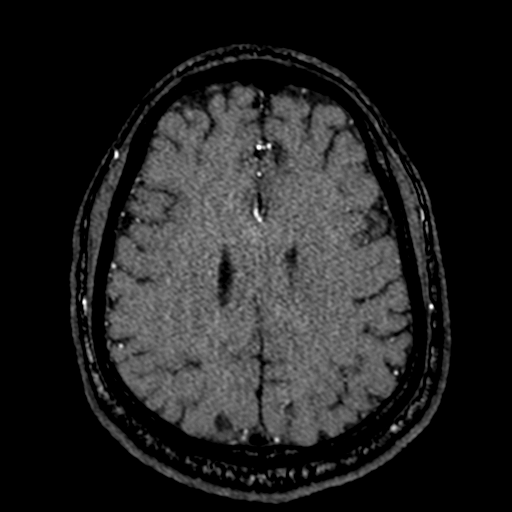

[18 of 48 positions shown; findings below may reference images not displayed]

FINDINGS: MRA NECK FINDINGS

Precontrast time-of-flight neck MRA images reveal antegrade flow in
the bilateral cervical carotid and vertebral arteries. Partially
retropharyngeal course of the left carotid including the
bifurcation. Tortuous bilateral ICAs and vertebral arteries in the
upper neck.

Post-contrast neck MRA images reveal a 3 vessel arch configuration.
Proximal great vessels appear normal.

Mildly tortuous right CCA. Negative right carotid bifurcation.
Tortuous right ICA just below the skull base with no stenosis.

Mildly tortuous left CCA without stenosis. Widely patent left
carotid bifurcation. Tortuous left ICA distal to the bulb without
stenosis.

Proximal subclavian arteries and vertebral artery origins are within
normal limits. The left vertebral appears slightly dominant
throughout the neck. Both vertebral arteries are patent to the skull
base with distal tortuosity but no stenosis.

MRA HEAD FINDINGS

Antegrade flow in the posterior circulation with fairly codominant
distal vertebral arteries. Normal PICA origins and vertebrobasilar
junction. Patent basilar artery, SCA and PCA origins with no
stenosis. Normal left posterior communicating artery, the right is
diminutive or absent. Bilateral PCA branches are within normal
limits.

Antegrade flow in both ICA siphons. Tortuous right ICA just below
the skull base. No siphon stenosis. Normal ophthalmic and left
posterior communicating artery origins. Patent carotid termini, MCA
and ACA origins. Diminutive or absent anterior communicating artery.
Visible ACA branches are within normal limits. MCA M1 segments and
bi/trifurcations are patent without stenosis. Visible bilateral MCA
branches are within normal limits.

No intracranial mass effect or ventriculomegaly.
IMPRESSION: Negative MRA head and Neck aside from large vessel tortuosity. No
hemodynamically significant stenosis.

## 2021-05-31 IMAGING — MR MR MRA NECK WO/W CM
5 of 7 series · 41 of 48 positions shown · IV contrast (gadavist)
Comparison: Brain MRI [DATE].

CLINICAL DATA: 40-year-old male with paroxysmal atrial
fibrillation. Neurologic deficit. MRI yesterday suggests subacute
white matter lacunar infarcts in the right frontal lobe.

EXAM:
MRA HEAD WITHOUT CONTRAST
MRA NECK WITHOUT AND WITH CONTRAST
TECHNIQUE: Angiographic images of the Circle of Willis were obtained using MRA
technique without intravenous contrast. Angiographic images of the
neck were obtained using MRA technique without and with intravenous
contrast. Carotid stenosis measurements (when applicable) are
obtained utilizing NASCET criteria, using the distal internal
carotid diameter as the denominator.
CONTRAST:  9.5mL GADAVIST GADOBUTROL 1 MMOL/ML IV SOLN

[Series 7: tof_fl3d_tra_iso · axial · 0.6mm · 0.52mm/px · z∈[-145,-67]mm · 10 of 133 slices shown]
[im 1/133]
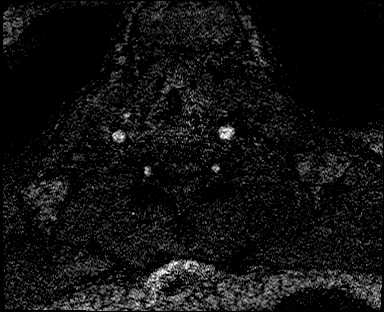
[im 13/133]
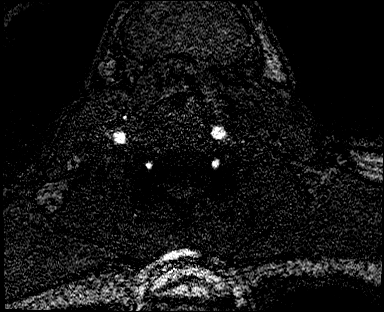
[im 25/133]
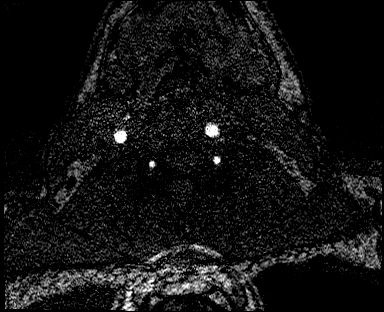
[im 37/133]
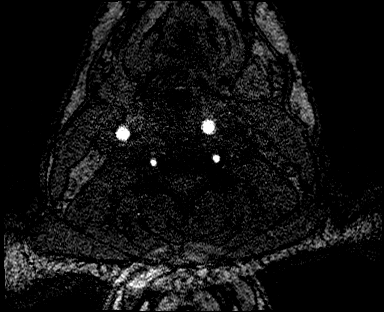
[im 61/133]
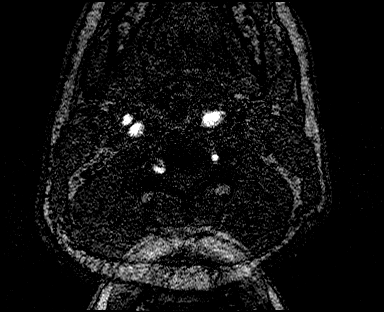
[im 73/133]
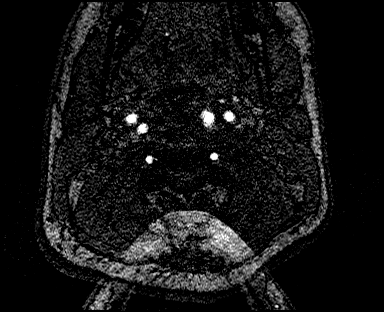
[im 97/133]
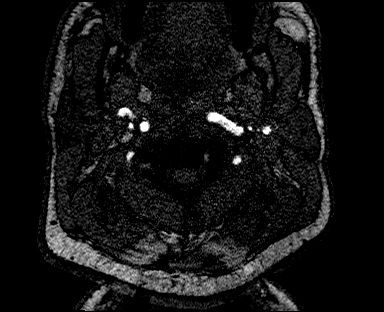
[im 109/133]
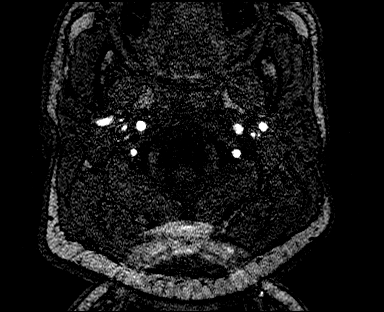
[im 121/133]
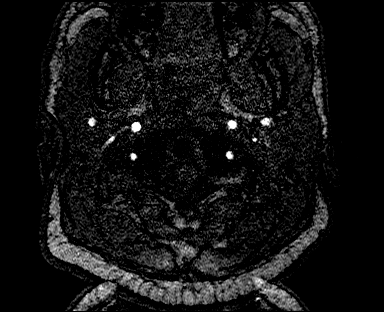
[im 133/133]
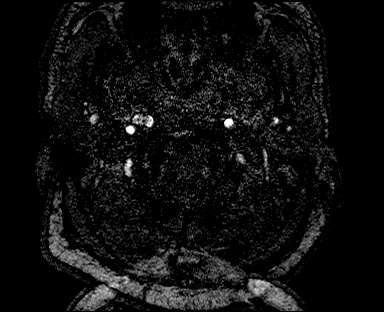

[Series 10: angio_fl3d_cor_pre_ttc=3.0s · coronal · 0.9mm · 0.85mm/px · 7 of 80 slices shown]
[im 1/80]
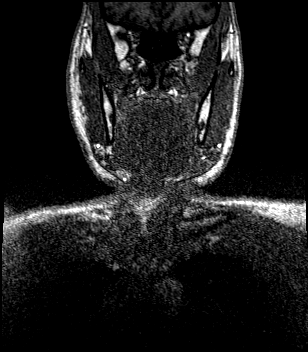
[im 14/80]
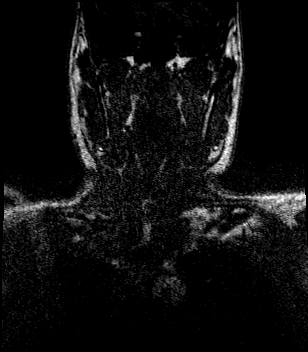
[im 27/80]
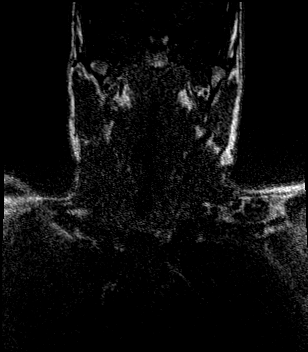
[im 40/80]
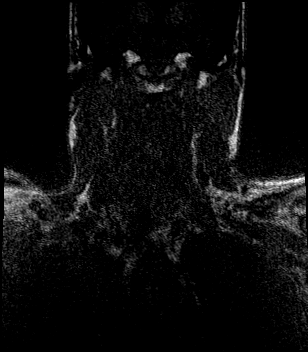
[im 53/80]
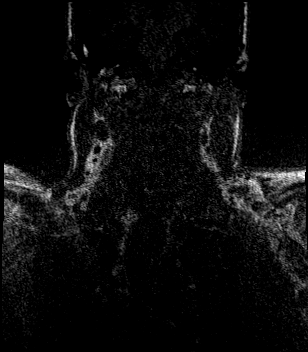
[im 66/80]
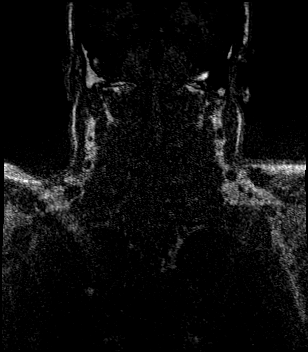
[im 80/80]
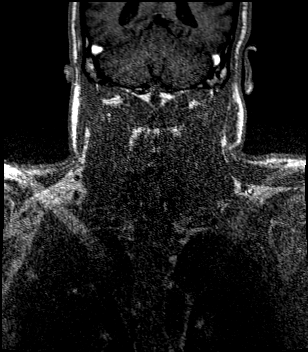

[Series 12: angio_fl3d_cor_post_ttc=3.0s · coronal · 0.9mm · 0.85mm/px · 8 of 77 slices shown]
[im 1/77]
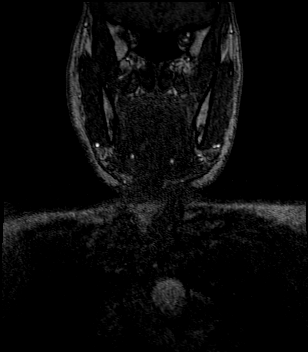
[im 11/77]
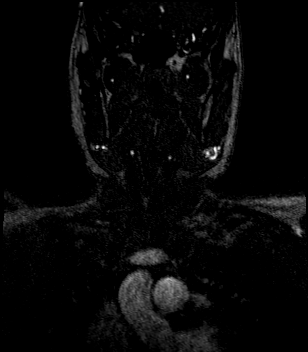
[im 22/77]
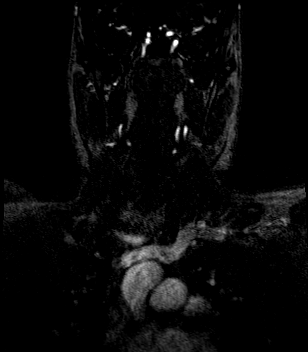
[im 33/77]
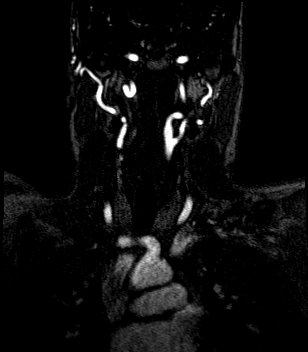
[im 44/77]
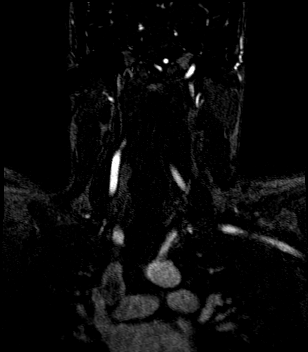
[im 55/77]
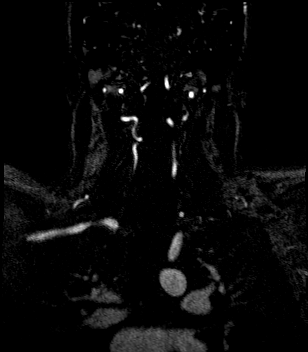
[im 66/77]
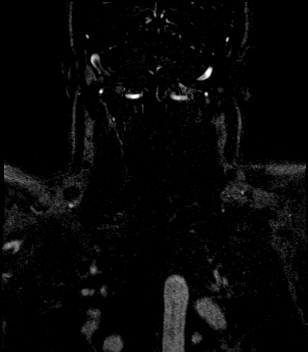
[im 77/77]
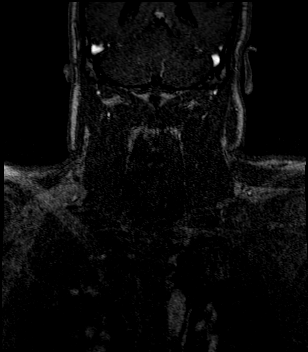

[Series 13: angio_fl3d_cor_post_ttc=3.0s_moco-adv · coronal · 0.9mm · 0.85mm/px · 8 of 77 slices shown]
[im 1/77]
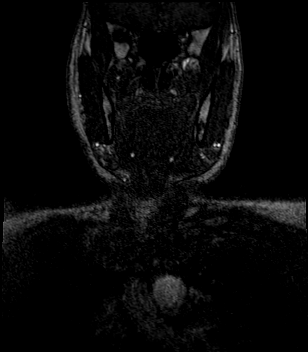
[im 11/77]
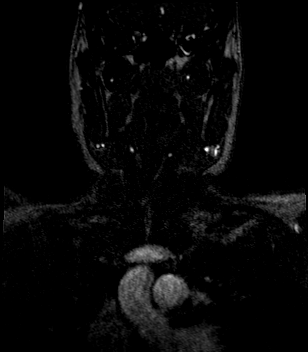
[im 22/77]
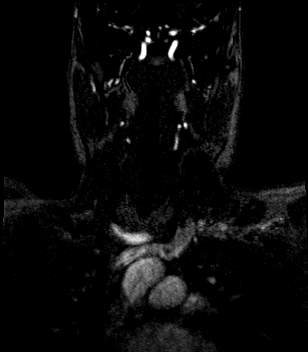
[im 33/77]
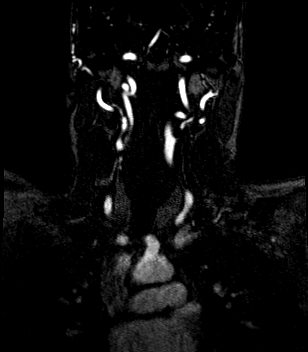
[im 44/77]
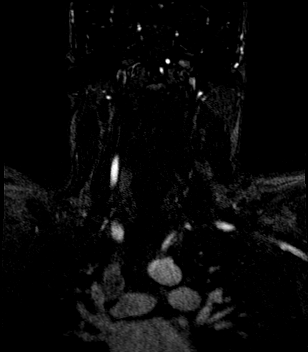
[im 55/77]
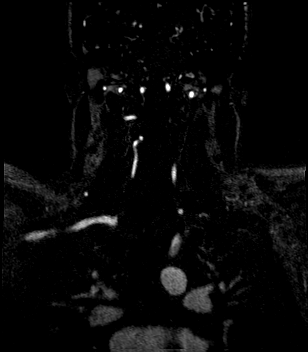
[im 66/77]
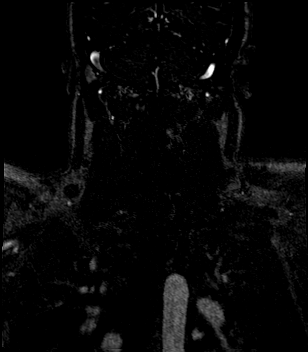
[im 77/77]
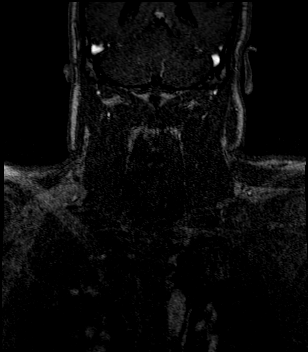

[Series 14: angio_fl3d_cor_post_ttc=3.0s_moco-adv_sub · coronal · 0.9mm · 0.85mm/px · 8 of 78 slices shown]
[im 1/78]
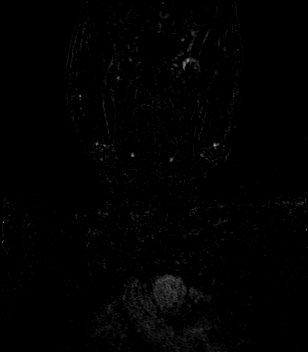
[im 12/78]
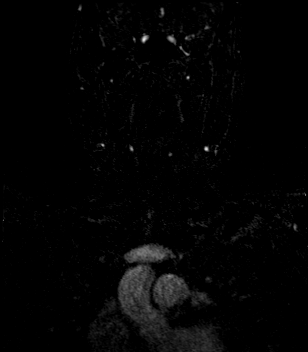
[im 23/78]
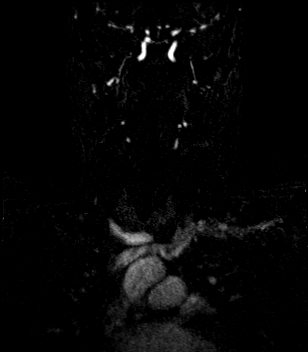
[im 34/78]
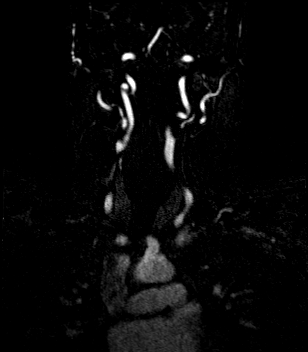
[im 45/78]
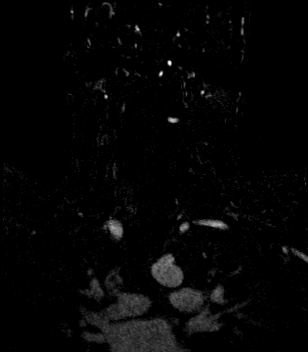
[im 56/78]
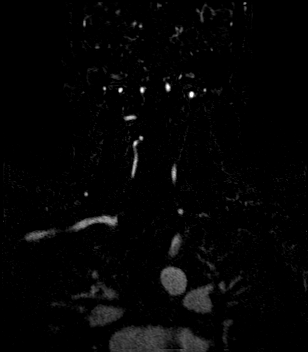
[im 67/78]
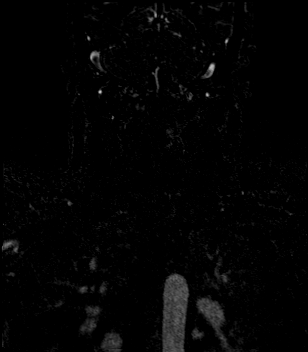
[im 78/78]
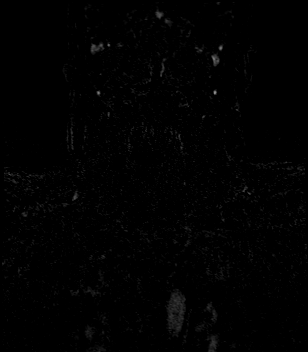

[41 of 48 positions shown; findings below may reference images not displayed]

FINDINGS: MRA NECK FINDINGS

Precontrast time-of-flight neck MRA images reveal antegrade flow in
the bilateral cervical carotid and vertebral arteries. Partially
retropharyngeal course of the left carotid including the
bifurcation. Tortuous bilateral ICAs and vertebral arteries in the
upper neck.

Post-contrast neck MRA images reveal a 3 vessel arch configuration.
Proximal great vessels appear normal.

Mildly tortuous right CCA. Negative right carotid bifurcation.
Tortuous right ICA just below the skull base with no stenosis.

Mildly tortuous left CCA without stenosis. Widely patent left
carotid bifurcation. Tortuous left ICA distal to the bulb without
stenosis.

Proximal subclavian arteries and vertebral artery origins are within
normal limits. The left vertebral appears slightly dominant
throughout the neck. Both vertebral arteries are patent to the skull
base with distal tortuosity but no stenosis.

MRA HEAD FINDINGS

Antegrade flow in the posterior circulation with fairly codominant
distal vertebral arteries. Normal PICA origins and vertebrobasilar
junction. Patent basilar artery, SCA and PCA origins with no
stenosis. Normal left posterior communicating artery, the right is
diminutive or absent. Bilateral PCA branches are within normal
limits.

Antegrade flow in both ICA siphons. Tortuous right ICA just below
the skull base. No siphon stenosis. Normal ophthalmic and left
posterior communicating artery origins. Patent carotid termini, MCA
and ACA origins. Diminutive or absent anterior communicating artery.
Visible ACA branches are within normal limits. MCA M1 segments and
bi/trifurcations are patent without stenosis. Visible bilateral MCA
branches are within normal limits.

No intracranial mass effect or ventriculomegaly.
IMPRESSION: Negative MRA head and Neck aside from large vessel tortuosity. No
hemodynamically significant stenosis.

## 2021-05-31 MED ORDER — THIAMINE HCL 100 MG/ML IJ SOLN
100.0000 mg | Freq: Every day | INTRAMUSCULAR | Status: DC
  Administered 2021-05-31: 100 mg via INTRAVENOUS
  Filled 2021-05-31: qty 2

## 2021-05-31 MED ORDER — HYDRALAZINE HCL 20 MG/ML IJ SOLN
10.0000 mg | Freq: Four times a day (QID) | INTRAMUSCULAR | Status: DC | PRN
  Administered 2021-05-31: 10 mg via INTRAVENOUS
  Filled 2021-05-31: qty 1

## 2021-05-31 MED ORDER — LACTATED RINGERS IV SOLN: INTRAVENOUS | Status: DC

## 2021-05-31 MED ORDER — LORAZEPAM 2 MG/ML IJ SOLN
INTRAMUSCULAR | Status: AC
  Administered 2021-05-31: 1 mg via INTRAVENOUS
  Filled 2021-05-31: qty 1

## 2021-05-31 MED ORDER — INSULIN DETEMIR 100 UNIT/ML ~~LOC~~ SOLN: 20.0000 [IU] | Freq: Every day | SUBCUTANEOUS | Status: DC

## 2021-05-31 MED ORDER — HYDRALAZINE HCL 20 MG/ML IJ SOLN
10.0000 mg | Freq: Once | INTRAMUSCULAR | Status: AC
  Administered 2021-05-31: 10 mg via INTRAVENOUS
  Filled 2021-05-31: qty 1

## 2021-05-31 MED ORDER — GADOBUTROL 1 MMOL/ML IV SOLN
9.5000 mL | Freq: Once | INTRAVENOUS | Status: AC | PRN
  Administered 2021-05-31: 9.5 mL via INTRAVENOUS

## 2021-05-31 MED ORDER — ASPIRIN EC 81 MG PO TBEC
81.0000 mg | DELAYED_RELEASE_TABLET | Freq: Every day | ORAL | Status: DC
  Administered 2021-05-31: 81 mg via ORAL
  Filled 2021-05-31: qty 1

## 2021-05-31 MED ORDER — THIAMINE HCL 100 MG PO TABS
100.0000 mg | ORAL_TABLET | Freq: Every day | ORAL | Status: DC
  Filled 2021-05-31: qty 1

## 2021-05-31 MED ORDER — LORAZEPAM 2 MG/ML IJ SOLN: 1.0000 mg | Freq: Once | INTRAMUSCULAR | Status: AC | PRN

## 2021-05-31 MED ORDER — INSULIN ASPART 100 UNIT/ML IJ SOLN
0.0000 [IU] | Freq: Three times a day (TID) | INTRAMUSCULAR | Status: DC
  Administered 2021-05-31 (×2): 3 [IU] via SUBCUTANEOUS

## 2021-05-31 MED ORDER — ENOXAPARIN SODIUM 40 MG/0.4ML IJ SOSY
40.0000 mg | PREFILLED_SYRINGE | Freq: Every day | INTRAMUSCULAR | Status: DC
  Filled 2021-05-31: qty 0.4

## 2021-05-31 MED ORDER — ACETAMINOPHEN 650 MG RE SUPP: 650.0000 mg | RECTAL | Status: DC | PRN

## 2021-05-31 MED ORDER — ACETAMINOPHEN 160 MG/5ML PO SOLN: 650.0000 mg | ORAL | Status: DC | PRN

## 2021-05-31 MED ORDER — ACETAMINOPHEN 500 MG PO TABS: 500.0000 mg | ORAL_TABLET | ORAL | Status: DC | PRN

## 2021-05-31 MED ORDER — APIXABAN 5 MG PO TABS
5.0000 mg | ORAL_TABLET | Freq: Two times a day (BID) | ORAL | 2 refills | Status: DC
Start: 2021-05-31 — End: 2021-08-31
  Filled 2021-05-31: qty 60, 30d supply, fill #0

## 2021-05-31 MED ORDER — APIXABAN 5 MG PO TABS
5.0000 mg | ORAL_TABLET | Freq: Two times a day (BID) | ORAL | Status: DC
  Administered 2021-05-31: 5 mg via ORAL
  Filled 2021-05-31: qty 1

## 2021-05-31 MED ORDER — ACETAMINOPHEN 325 MG PO TABS
650.0000 mg | ORAL_TABLET | ORAL | Status: DC | PRN
  Administered 2021-05-31: 650 mg via ORAL
  Filled 2021-05-31: qty 2

## 2021-05-31 MED ORDER — BUTALBITAL-APAP-CAFFEINE 50-325-40 MG PO TABS
1.0000 | ORAL_TABLET | Freq: Four times a day (QID) | ORAL | Status: DC | PRN
  Administered 2021-05-31: 1 via ORAL
  Filled 2021-05-31: qty 1

## 2021-05-31 MED ORDER — STROKE: EARLY STAGES OF RECOVERY BOOK: Freq: Once | Status: DC

## 2021-05-31 NOTE — TOC CAGE-AID Note (Signed)
Transition of Care [facility]) - CAGE-AID Screening   Patient Details  Name: Victor Bryan MRN: [id number] Date of Birth: 1980/05/23  [facility]) CM/SW Contact:    [name], LCSWA Phone Number: 05/31/2021, 1:21 PM   Clinical Narrative: Pt participated in [place].  Pt stated she does use marijuana, smokes cigarettes sometimes, and drinks ETOH socially.  Pt was offered resources, due to usage of substance and ETOH.     [name], MSW, LCSW-A Pronouns:  She/Her/Hers [place] Transitions of Care Clinical Social Worker Direct Number:  [phone] [name]  CAGE-AID Screening:    Have You Ever Felt You Ought to Cut Down on Your Drinking or Drug Use?: No Have People Annoyed You By [employer] Your Drinking Or Drug Use?: No Have You Felt Bad Or Guilty About Your Drinking Or Drug Use?: No Have You Ever Had a Drink or Used Drugs First Thing In The Morning to Steady Your Nerves or to Get Rid of a Hangover?: No CAGE-AID Score: 0  Substance Abuse Education Offered: Yes  Substance abuse interventions: Scientist, clinical (histocompatibility and immunogenetics)

## 2021-05-31 NOTE — Progress Notes (Signed)
Echocardiogram 2D Echocardiogram has been performed.  Arlyss Gandy 05/31/2021, 12:50 PM

## 2021-05-31 NOTE — Discharge Summary (Addendum)
DISCHARGE SUMMARY  Victor Bryan  MR#: [id number]  DOB:12/09/1980  Date of Admission: 05/30/2021 Date of Discharge: 05/31/2021  Attending Physician:Brogan England Hennie Duos, MD  Patient's PCP:Pcp, No  Consults: Neurology   Disposition: D/C home   Follow-up Appts:  Follow-up Information     Masters, Joellen Jersey, DO Follow up on 06/19/2021.   Specialty: Internal Medicine Why: Time 1:15, Please arrive 15 min early to complete paperwork, PCP establishment Contact information: [place] [address] [phone]                 Tests Needing Follow-up: -review results of TTE obtained 05/31/21 (results still pending at time of d/c) -consider CK, CRP, vitamin B6, vitamin B1, vitamin B12, MMA, RPR, and folate if LE weakness sx persist in absence of statin  -f/u renal function   Discharge Diagnoses: Bilateral lower extremity weakness - dizziness TIA v/s punctate CVA mid right frontal lobe Hyperlipidemia Acute kidney injury Paroxysmal atrial fibrillation HTN DM2 -uncontrolled with hyperglycemia OSA not on CPAP Obesity - Body mass index is 38.4 kg/m.  Initial presentation: 41yo with a history of HTN, PAF not on anticoagulation (due to noncompliance reportedly), OSA not on CPAP, and DM 2 who presented to the ED with lower extremity weakness and dizziness for 3-4 days.  MRI of the brain noted subacute infarct mid right frontal lobe.  Hospital Course:   Bilateral lower extremity weakness - dizziness Felt to be nutritional/volume related as MRI brain findings do not coordinate with these complaints - could conceivalby also be related to use of statin, which has been stopped for now - normal MRI of the thoracic and lumbar sections - symptoms significantly improved at time of discharge with patient able to ambulate without difficulty - HHPT/OT and RW to be arranged -extensive work-up suggested by neurology to include CK, CRP, vitamin B6, vitamin B1, vitamin B12, MMA,  RPR, and folate but patient was interested in going home as soon as possible and given his clinical improvement it is felt reasonable to accomplish this in the outpatient setting   TIA mid right frontal lobe Felt to be incidental and not related to presenting symptoms -full stroke work-up as follows per Neurology: Code Stroke - No acute abnormality. Small vessel disease. ASPECTS 10.    MRI Brain Subcentimeter subacute right mid frontal lobe infarct vs white matter hyperintensity- on T2 MRA head and Neck aside from large vessel tortuosity. No hemodynamically significant stenosis. MRI Lumbar-normal lumbar spine MRI Thoracic-normal thoracic spine 2D Echo pending LDL 143 HgbA1c 7.8   Hyperlipidemia LDL 143 in the setting of CVA - tx being stopped due to possible statin related muscle weakness B LE - f/u as outpt - consider alternative therapies if wgt loss and exercise do not improve    Acute kidney injury Baseline creatinine 1.9 with creatinine 2.4 at presentation - hydrated during this admit   Paroxysmal atrial fibrillation Not on chronic anticoagulation reportedly due to simple noncompliance - Neuro suggests initiation of Eliquis which has been accomplished    HTN Practicedpermissive hypertension during admit - resume usual home meds at time of d/c    DM2 -uncontrolled with hyperglycemia A1c 7.8 -importance of strict control stressed to patient -follow strict diabetic diet   OSA not on CPAP   Obesity - Body mass index is 38.4 kg/m.  Allergies as of 05/31/2021       Reactions   Morphine Itching   Other reaction(s): Unknown        Medication List  STOP taking these medications    pravastatin 20 MG tablet Commonly known as: PRAVACHOL       TAKE these medications    acetaminophen 500 MG tablet Commonly known as: TYLENOL Take 500 mg by mouth every 6 (six) hours as needed for mild pain or moderate pain.   butalbital-acetaminophen-caffeine 50-325-40 MG  tablet Commonly known as: FIORICET Take 1 tablet by mouth every 8 (eight) hours as needed for headache or migraine.   Bydureon BCise 2 MG/0.85ML Auij Generic drug: Exenatide ER Inject 2 mg into the skin once a week.   citalopram 20 MG tablet Commonly known as: CELEXA Take 20 mg by mouth daily.   diltiazem 180 MG 24 hr capsule Commonly known as: TIAZAC Take 180 mg by mouth daily.   Eliquis 5 MG Tabs tablet Generic drug: apixaban Take 1 tablet (5 mg total) by mouth 2 (two) times daily.   furosemide 20 MG tablet Commonly known as: LASIX Take 20 mg by mouth 2 (two) times daily.   gabapentin 600 MG tablet Commonly known as: NEURONTIN Take 600 mg by mouth 3 (three) times daily.   hydrALAZINE 25 MG tablet Commonly known as: APRESOLINE Take 25 mg by mouth 3 (three) times daily.   Levemir FlexTouch 100 UNIT/ML FlexPen Generic drug: insulin detemir Inject 50 Units into the skin at bedtime.   NovoLOG FlexPen 100 UNIT/ML FlexPen Generic drug: insulin aspart 18 Units 3 (three) times daily with meals.   omeprazole 40 MG capsule Commonly known as: PRILOSEC Take 40 mg by mouth daily.   potassium chloride 10 MEQ tablet Commonly known as: KLOR-CON Take 10 mEq by mouth 2 (two) times daily.        Day of Discharge BP (!) 161/103    Pulse (!) 101    Temp 98.7 F (37.1 C)    Resp 20    Ht 5\' 9"  (1.753 m)    Wt 117.9 kg    SpO2 99%    BMI 38.40 kg/m   Physical Exam: General: No acute respiratory distress Lungs: Clear to auscultation bilaterally without wheezes or crackles Cardiovascular: Regular rate without murmur gallop or rub normal S1 and S2 Abdomen: Nontender, nondistended, soft, bowel sounds positive, no rebound, no ascites, no appreciable mass Extremities: No significant cyanosis, clubbing, or edema bilateral lower extremities  Basic Metabolic Panel: Recent Labs  Lab 05/30/21 1637  NA 136  K 3.7  CL 105  CO2 23  GLUCOSE 223*  BUN 12  CREATININE 2.41*   CALCIUM 9.0    CBC: Recent Labs  Lab 05/30/21 1637 05/31/21 0211  WBC 8.9 9.4  NEUTROABS 6.5  --   HGB 13.1 13.1  HCT 39.9 38.0*  MCV 77.5* 76.3*  PLT 280 266   CBG: Recent Labs  Lab 05/31/21 0803 05/31/21 1159  GLUCAP 171* 172*    Time spent in discharge (includes decision making & examination of pt): 30 minutes  05/31/2021, 4:35 PM   Cherene Altes, MD [facility] Office  [phone]

## 2021-05-31 NOTE — Progress Notes (Addendum)
STROKE TEAM PROGRESS NOTE   INTERVAL HISTORY No family at the bedside. Patient seen in ED room. He states that he was started on a statin approx 6 weeks ago. Plan to stop that due to leg weakness. Start eliquis due to history of afib. Patient states that he has been feeling off balance since Friday.  Yesterday he went out to eat with his mom and was sitting at the bar and when he went to stand up he felt like his legs were asleep and he felt "woozy".  He had an incident walking down the stairs where he felt like his legs buckled and he had to lean against the wall to steady himself.  He also needed his fiance's help getting out of the shower.  His appetite has been decreased and he typically only eats 1 meal a day.  He did throw up on Friday and he has had diarrhea. MRI brain negative for acute stroke there is a right parasagittal frontal weak diffusion positive lesion which is also bright on T2 and FLAIR and may represent remote age silent infarct with T2 shine through Vitals:   05/31/21 0405 05/31/21 0410 05/31/21 0430 05/31/21 0515  BP: (!) 179/121 (!) 179/121 (!) 157/99 (!) 157/104  Pulse: 86  91 91  Resp: (!) 22  (!) 21 (!) 22  Temp:      SpO2: 99%  99% 99%  Weight:      Height:       CBC:  Recent Labs  Lab 05/30/21 1637 05/31/21 0211  WBC 8.9 9.4  NEUTROABS 6.5  --   HGB 13.1 13.1  HCT 39.9 38.0*  MCV 77.5* 76.3*  PLT 280 403   Basic Metabolic Panel:  Recent Labs  Lab 05/30/21 1637  NA 136  K 3.7  CL 105  CO2 23  GLUCOSE 223*  BUN 12  CREATININE 2.41*  CALCIUM 9.0   Lipid Panel:  Recent Labs  Lab 05/31/21 0212  CHOL 211*  TRIG 99  HDL 48  CHOLHDL 4.4  VLDL 20  LDLCALC 143*   HgbA1c:  Recent Labs  Lab 05/31/21 0212  HGBA1C 7.8*   Urine Drug Screen: No results for input(s): LABOPIA, COCAINSCRNUR, LABBENZ, AMPHETMU, THCU, LABBARB in the last 168 hours.  Alcohol Level No results for input(s): ETH in the last 168 hours.  IMAGING past 24 hours DG Chest 2  View  Result Date: 05/30/2021 CLINICAL DATA:  Bilateral leg weakness EXAM: CHEST - 2 VIEW COMPARISON:  None. FINDINGS: Cardiac and mediastinal contours are likely within normal limits given low lung volumes and AP technique. No focal pulmonary opacity. No pleural effusion or pneumothorax. No acute osseous abnormality. IMPRESSION: No acute cardiopulmonary process. Electronically Signed   By: Merilyn Baba M.D.   On: 05/30/2021 17:19   CT Head Wo Contrast  Result Date: 05/30/2021 CLINICAL DATA:  Provided history: Dizziness, persistent/recurrent, cardiac or vascular cause suspected. Additional history provided: Patient reports dizziness, headache, worsening bilateral leg weakness for 2 days. EXAM: CT HEAD WITHOUT CONTRAST TECHNIQUE: Contiguous axial images were obtained from the base of the skull through the vertex without intravenous contrast. RADIATION DOSE REDUCTION: This exam was performed according to the departmental dose-optimization program which includes automated exposure control, adjustment of the mA and/or kV according to patient size and/or use of iterative reconstruction technique. COMPARISON:  No pertinent prior exams available for comparison. FINDINGS: Brain: Small circumscribed hypodensity within the inferior right basal ganglia, likely reflecting a prominent perivascular space. There is no  acute intracranial hemorrhage. No demarcated cortical infarct. No extra-axial fluid collection. No evidence of an intracranial mass. No midline shift. Vascular: No hyperdense vessel. Skull: Normal. Negative for fracture or focal lesion. Sinuses/Orbits: Visualized orbits show no acute finding. 17 mm mucous retention cyst within the right maxillary sinus. Small mucous retention cyst within the left maxillary sinus. Small volume frothy secretions within a posterior right ethmoid air cell. Small mucous retention cyst within the inferior right frontal sinus. Other: Small-volume fluid within the right mastoid air  cells. Debris within the left external auditory canal. IMPRESSION: No evidence of acute intracranial abnormality. Paranasal sinus disease, as described. Small-volume fluid within the right mastoid air cells. Electronically Signed   By: Kellie Simmering D.O.   On: 05/30/2021 17:31   MR ANGIO HEAD WO CONTRAST  Result Date: 05/31/2021 CLINICAL DATA:  41 year old male with paroxysmal atrial fibrillation. Neurologic deficit. MRI yesterday suggests subacute white matter lacunar infarcts in the right frontal lobe. EXAM: MRA HEAD WITHOUT CONTRAST MRA NECK WITHOUT AND WITH CONTRAST TECHNIQUE: Angiographic images of the Circle of Willis were obtained using MRA technique without intravenous contrast. Angiographic images of the neck were obtained using MRA technique without and with intravenous contrast. Carotid stenosis measurements (when applicable) are obtained utilizing NASCET criteria, using the distal internal carotid diameter as the denominator. CONTRAST:  9.33mL GADAVIST GADOBUTROL 1 MMOL/ML IV SOLN COMPARISON:  Brain MRI 05/30/2021. FINDINGS: MRA NECK FINDINGS Precontrast time-of-flight neck MRA images reveal antegrade flow in the bilateral cervical carotid and vertebral arteries. Partially retropharyngeal course of the left carotid including the bifurcation. Tortuous bilateral ICAs and vertebral arteries in the upper neck. Post-contrast neck MRA images reveal a 3 vessel arch configuration. Proximal great vessels appear normal. Mildly tortuous right CCA. Negative right carotid bifurcation. Tortuous right ICA just below the skull base with no stenosis. Mildly tortuous left CCA without stenosis. Widely patent left carotid bifurcation. Tortuous left ICA distal to the bulb without stenosis. Proximal subclavian arteries and vertebral artery origins are within normal limits. The left vertebral appears slightly dominant throughout the neck. Both vertebral arteries are patent to the skull base with distal tortuosity but no  stenosis. MRA HEAD FINDINGS Antegrade flow in the posterior circulation with fairly codominant distal vertebral arteries. Normal PICA origins and vertebrobasilar junction. Patent basilar artery, SCA and PCA origins with no stenosis. Normal left posterior communicating artery, the right is diminutive or absent. Bilateral PCA branches are within normal limits. Antegrade flow in both ICA siphons. Tortuous right ICA just below the skull base. No siphon stenosis. Normal ophthalmic and left posterior communicating artery origins. Patent carotid termini, MCA and ACA origins. Diminutive or absent anterior communicating artery. Visible ACA branches are within normal limits. MCA M1 segments and bi/trifurcations are patent without stenosis. Visible bilateral MCA branches are within normal limits. No intracranial mass effect or ventriculomegaly. IMPRESSION: Negative MRA head and Neck aside from large vessel tortuosity. No hemodynamically significant stenosis. Electronically Signed   By: Genevie Ann M.D.   On: 05/31/2021 04:42   MR ANGIO NECK W WO CONTRAST  Result Date: 05/31/2021 CLINICAL DATA:  41 year old male with paroxysmal atrial fibrillation. Neurologic deficit. MRI yesterday suggests subacute white matter lacunar infarcts in the right frontal lobe. EXAM: MRA HEAD WITHOUT CONTRAST MRA NECK WITHOUT AND WITH CONTRAST TECHNIQUE: Angiographic images of the Circle of Willis were obtained using MRA technique without intravenous contrast. Angiographic images of the neck were obtained using MRA technique without and with intravenous contrast. Carotid stenosis measurements (when applicable) are  obtained utilizing NASCET criteria, using the distal internal carotid diameter as the denominator. CONTRAST:  9.5mL GADAVIST GADOBUTROL 1 MMOL/ML IV SOLN COMPARISON:  Brain MRI 05/30/2021. FINDINGS: MRA NECK FINDINGS Precontrast time-of-flight neck MRA images reveal antegrade flow in the bilateral cervical carotid and vertebral arteries.  Partially retropharyngeal course of the left carotid including the bifurcation. Tortuous bilateral ICAs and vertebral arteries in the upper neck. Post-contrast neck MRA images reveal a 3 vessel arch configuration. Proximal great vessels appear normal. Mildly tortuous right CCA. Negative right carotid bifurcation. Tortuous right ICA just below the skull base with no stenosis. Mildly tortuous left CCA without stenosis. Widely patent left carotid bifurcation. Tortuous left ICA distal to the bulb without stenosis. Proximal subclavian arteries and vertebral artery origins are within normal limits. The left vertebral appears slightly dominant throughout the neck. Both vertebral arteries are patent to the skull base with distal tortuosity but no stenosis. MRA HEAD FINDINGS Antegrade flow in the posterior circulation with fairly codominant distal vertebral arteries. Normal PICA origins and vertebrobasilar junction. Patent basilar artery, SCA and PCA origins with no stenosis. Normal left posterior communicating artery, the right is diminutive or absent. Bilateral PCA branches are within normal limits. Antegrade flow in both ICA siphons. Tortuous right ICA just below the skull base. No siphon stenosis. Normal ophthalmic and left posterior communicating artery origins. Patent carotid termini, MCA and ACA origins. Diminutive or absent anterior communicating artery. Visible ACA branches are within normal limits. MCA M1 segments and bi/trifurcations are patent without stenosis. Visible bilateral MCA branches are within normal limits. No intracranial mass effect or ventriculomegaly. IMPRESSION: Negative MRA head and Neck aside from large vessel tortuosity. No hemodynamically significant stenosis. Electronically Signed   By: Genevie Ann M.D.   On: 05/31/2021 04:42   MR Brain Wo Contrast (neuro protocol)  Result Date: 05/30/2021 CLINICAL DATA:  Provided history: Neuro deficit, acute, stroke suspected. EXAM: MRI HEAD WITHOUT CONTRAST  TECHNIQUE: Multiplanar, multiecho pulse sequences of the brain and surrounding structures were obtained without intravenous contrast. COMPARISON:  Non-contrast head CT performed earlier today 05/30/2021. FINDINGS: Brain: Intermittently motion degraded examination. Most notably, there is moderate motion degradation of the coronal T2 TSE sequence. Cerebral volume is normal. There is a 6 mm focus of diffusion-weighted and T2 FLAIR hyperintense signal abnormality within the medial aspect of the mid right frontal lobe (series 2, images 32 and 33). There is corresponding intermediate signal at this site on the [facility] map, and this is suspicious for a small early subacute infarct. Background multifocal T2 FLAIR hyperintense signal abnormality within the cerebral white matter, mild but advanced for age. No evidence of an intracranial mass. No chronic intracranial blood products. No extra-axial fluid collection. No midline shift. Vascular: Maintained flow voids within the proximal large arterial vessels. Skull and upper cervical spine: No focal suspicious marrow lesion. Sinuses/Orbits: Visualized orbits show no acute finding. Small mucous retention cysts within the right maxillary sinus. Trace mucosal thickening within the bilateral ethmoid sinuses. Small mucous retention cyst within the right frontal sinus. Other: Small right mastoid effusion. Trace fluid also present within the left mastoid air cells. IMPRESSION: Intermittently motion degraded examination. 6 mm focus of diffusion-weighted and T2 FLAIR hyperintense signal abnormality within the medial aspect of the mid right frontal lobe, as described and suspicious for a small early subacute infarct. Background mild multifocal T2 FLAIR intense signal abnormality within the cerebral white matter, overall mild but advanced for age. These signal changes are nonspecific, but likely reflect sequela of chronic  small vessel ischemic disease given the patient's history of diabetes,  hypertension and hyperlipidemia. Paranasal sinus disease, as described. Small right mastoid effusion. Trace fluid also present within the left mastoid air cells. Electronically Signed   By: Kellie Simmering D.O.   On: 05/30/2021 21:56   MR THORACIC SPINE WO CONTRAST  Result Date: 05/30/2021 CLINICAL DATA:  Mid back pain EXAM: MRI THORACIC SPINE WITHOUT CONTRAST TECHNIQUE: Multiplanar, multisequence MR imaging of the thoracic spine was performed. No intravenous contrast was administered. COMPARISON:  None. FINDINGS: Alignment:  Physiologic. Vertebrae: No fracture, evidence of discitis, or bone lesion. Cord:  Normal signal and morphology. Paraspinal and other soft tissues: Negative. Disc levels: No disc herniation or spinal canal stenosis. IMPRESSION: Normal thoracic spine. Electronically Signed   By: Ulyses Jarred M.D.   On: 05/30/2021 22:05   MR LUMBAR SPINE WO CONTRAST  Result Date: 05/30/2021 CLINICAL DATA:  Acute lumbar myelopathy. EXAM: MRI LUMBAR SPINE WITHOUT CONTRAST TECHNIQUE: Multiplanar, multisequence MR imaging of the lumbar spine was performed. No intravenous contrast was administered. COMPARISON:  None. FINDINGS: Segmentation:  Standard. Alignment:  Physiologic. Vertebrae:  No fracture, evidence of discitis, or bone lesion. Conus medullaris and cauda equina: Conus extends to the L1 level. Conus and cauda equina appear normal. Paraspinal and other soft tissues: Negative. Disc levels: No spinal canal or neural foraminal stenosis IMPRESSION: Normal MRI of the lumbar spine. Electronically Signed   By: Ulyses Jarred M.D.   On: 05/30/2021 22:14    PHYSICAL EXAM  Physical Exam  Constitutional: Appears obese middle-aged African-American male cardiovascular: Normal rate and regular rhythm.  Respiratory: Effort normal, non-labored breathing  Neuro: Mental Status: Patient is awake, alert, oriented to person, place, month, year, and situation. Patient is able to give a clear and coherent history. No  signs of aphasia or neglect Cranial Nerves: II: Visual Fields are full. Pupils are equal, round, and reactive to light.   III,IV, VI: EOMI without ptosis or diploplia.  V: Facial sensation is symmetric to temperature VII: Facial movement is symmetric resting and smiling VIII: Hearing is intact to voice X: Palate elevates symmetrically XI: Shoulder shrug is symmetric. XII: Tongue protrudes midline without atrophy or fasciculations.  Motor: Tone is normal. Bulk is normal. 5/5 strength was present bilateral upper extremities BLE hip flexor 4/5 though effort is variable and follow-up BLE Knee flexion/extension 5/5 BLE Plantarflexion 4/5  BLE Dorsiflexion 5/5 Sensory: Sensation is symmetric to light touch and temperature in the arms and legs. No extinction to DSS present.  Deep Tendon Reflexes: 2+ patellae and achillies.  Plantars: Toes are downgoing bilaterally.  Cerebellar: FNF and HKS are intact bilaterally   ASSESSMENT/PLAN Victor Bryan is a 41 y.o. male with history of pAfibb not on AC, poorly controlled DM2, HTN, migraine  presenting with few days history of poor po intake, headache, lightheadedness, BL lower extremity weakness.  White matter hyperintensity seen on T2, consistent with T2 shine through versus subacute right mid frontal lobe infarct.  Stop pravastatin related to leg weakness.  Continue aspirin 81 mg daily, mitigate risk factors.   Bilateral ower extremity weakness possibly related to statin use doubt stroke or TIA Code Stroke - No acute abnormality. Small vessel disease. ASPECTS 10.    MRI Brain Subcentimeter subacute right mid frontal lobe infarct vs white matter hyperintensity- on T2 MRA head and Neck aside from large vessel tortuosity. No hemodynamically significant stenosis. MRI Lumbar-normal lumbar spine MRI Thoracic-normal thoracic spine 2D Echo pending LDL 143 HgbA1c 7.8 VTE  prophylaxis - Lovenox No antithrombotic prior to admission, now on aspirin  81 mg daily.  Therapy recommendations:  pending Disposition:  pending  Paraoxysmal Atrial Fibrillation  Home med: Diltiazem 180mg  daily Start eliquis  Hypertension Home meds:  hydralazine 25mg  TID, lasix 20mg  BID Stable Permissive hypertension (OK if < 220/120) but gradually normalize in 5-7 days Long-term BP goal normotensive  Hyperlipidemia Home meds:  pravastatin 20mg  LDL 143, goal < 70 Stop due to myalgia  Diabetes type II Uncontrolled Home meds:  Levemir, novolog  HgbA1c 7.8, goal < 7.0 CBGs SSI  Other Stroke Risk Factors Obesity, Body mass index is 38.4 kg/m., BMI >/= 30 associated with increased stroke risk, recommend weight loss, diet and exercise as appropriate  Migraines Uses Fioricet Denies recent headaches  Other Active Problems Elevated troponin Acute Kidney Injury Lower extremity muscle weakness Awaiting results for CK, CRP, Vit B6, B1, B12, methymalonic aid, RPR, folate  Normal spine imaging PT OT eval  Hospital day # 0  Patient seen and examined by NP/APP with MD. MD to update note as needed.   Janine Ores, DNP, FNP-BC [facility] Pager: [phone]  STROKE MD NOTE : I have personally obtained history,examined this patient, reviewed notes, independently viewed imaging studies, participated in medical decision making and plan of care.ROS completed by me personally and pertinent positives fully documented  I have made any additions or clarifications directly to the above note. Agree with note above.  Patient presented with symptoms of bilateral leg weakness and wobbly legs and dizziness without clear focal symptoms suggestive of stroke.  MRI scan of the brain was negative for stroke and MRI scan of the thoracic and the spine is also normal.  He may have mild proximal bilateral leg weakness possibly related to statin use.  Recommend discontinue statin and check muscle enzymes.  Follow-up as an outpatient with general neurology for further  evaluation and EMG nerve if interested.  Long discussion with patient and Dr. Thereasa Solo and answered questions.  Greater than 50% time during this 50-minute visit was spent on counseling and coordination of care about his bilateral leg weakness and discussion about evaluation and treatment and answering questions.Antony Contras, MD Medical Director [place] Pager: [phone] 05/31/2021 4:25 PM   To contact Stroke Continuity provider, please refer to [email or web address] After hours, contact General Neurology

## 2021-05-31 NOTE — Evaluation (Signed)
Occupational Therapy Evaluation Patient Details Name: Victor Bryan MRN: [id number] DOB: [birth year] Today's Date: 05/31/2021   History of Present Illness Pt is a 41 y/o male presenting on 2/14 with weakness, falls, dizziness. 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 Also with AKI, elevation of troponin and dehydration. PMH includes: afib, DM, HTN, migraine, OSA, .   Clinical Impression   PTA patient independent, driving and working. Admitted for above and presenting with problem list below, including impaired balance, decreased activity tolerance and decreased problem solving.  He currently completes ADLs with up to min guard assist, transfers and in room mobility with min guard assist.  Demonstrates difficulty sequencing months backwards requiring min cueing, some slow processing.  Based on performance today, believe he will benefit from continued OT services while admitted and after dc at [facility] level to optimize independence and safety for return to to [facility] [address] and return to work.      Recommendations for follow up therapy are one component of a multi-disciplinary discharge planning process, led by the attending physician.  Recommendations may be updated based on patient status, additional functional criteria and insurance authorization.   Follow Up Recommendations  Home health OT    Assistance Recommended at Discharge Intermittent Supervision/Assistance  Patient can return home with the following A little help with walking and/or transfers;A little help with bathing/dressing/bathroom;Assistance with cooking/housework;Direct supervision/assist for medications management;Direct supervision/assist for financial management;Assist for transportation    Functional Status Assessment  Patient has had a recent decline in their functional status and demonstrates the ability to make significant improvements in function in a reasonable and predictable amount of time.  Equipment  Recommendations  BSC/3in1    Recommendations for Other Services Speech consult     Precautions / Restrictions Precautions Precautions: Fall Precaution Comments: dizzy at times Restrictions Weight Bearing Restrictions: No Other Position/Activity Restrictions: mild sensory changes on B feet      Mobility Bed Mobility Overal bed mobility: Needs Assistance Bed Mobility: Sit to Supine       Sit to supine: Supervision   General bed mobility comments: EOB upon entry, returned back to supine with supervision    Transfers                          Balance Overall balance assessment: Needs assistance Sitting-balance support: No upper extremity supported, Feet supported Sitting balance-Leahy Scale: Good     Standing balance support: Bilateral upper extremity supported, During functional activity Standing balance-Leahy Scale: Fair Standing balance comment: relies on UE support                           ADL either performed or assessed with clinical judgement   ADL Overall ADL's : Needs assistance/impaired     Grooming: Min guard;Standing           Upper Body Dressing : Set up;Sitting   Lower Body Dressing: Min guard;Sit to/from stand   Toilet Transfer: Min guard;Ambulation;Rolling walker (2 wheels)   Toileting- Clothing Manipulation and Hygiene: Min guard;Sit to/from stand       Functional mobility during ADLs: Min guard;Rolling walker (2 wheels)       Vision Baseline Vision/History: 1 Wears glasses (contacts) Vision Assessment?: No apparent visual deficits     Perception     Praxis      Pertinent Vitals/Pain Pain Assessment Pain Assessment: No/denies pain     Hand Dominance Right   Extremity/Trunk  Assessment Upper Extremity Assessment Upper Extremity Assessment: Overall WFL for tasks assessed   Lower Extremity Assessment Lower Extremity Assessment: Defer to PT evaluation       Communication  Communication Communication: No difficulties   Cognition Arousal/Alertness: Awake/alert Behavior During Therapy: WFL for tasks assessed/performed Overall Cognitive Status: Impaired/Different from baseline Area of Impairment: Problem solving                             Problem Solving: Difficulty sequencing, Slow processing General Comments: some difficulty sequencing, slow processing.     General Comments  VSS    Exercises     Shoulder Instructions      Home Living Family/patient expects to be discharged to:: Private residence Living Arrangements: Spouse/significant other Available Help at Discharge: Available 24 hours/day;Family Type of Home: House Home Access: Level entry     Home Layout: Two level;1/2 bath on main level Alternate Level Stairs-Number of Steps: flight   Bathroom Shower/Tub: Occupational psychologist: Standard     Home Equipment: None          Prior Functioning/Environment Prior Level of Function : Independent/Modified Independent;Working/employed             Mobility Comments: 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 ADLs Comments: independent, drives; just started job as a Chiropodist Problem List: Decreased activity tolerance;Impaired balance (sitting and/or standing);Decreased cognition;Decreased safety awareness;Decreased knowledge of use of DME or AE;Decreased knowledge of precautions      OT Treatment/Interventions: Self-care/ADL training;Therapeutic exercise;DME and/or AE instruction;Therapeutic activities;Cognitive remediation/compensation;Patient/family education;Balance training    OT Goals(Current goals can be found in the care plan section) Acute Rehab OT Goals Patient Stated Goal: home OT Goal Formulation: With patient Time For Goal Achievement: 06/14/21 Potential to Achieve Goals: Good  OT Frequency: Min 2X/week    Co-evaluation               AM-PAC OT "6 Clicks" Daily Activity     Outcome Measure Help from another person eating meals?: None Help from another person taking care of personal grooming?: A Little Help from another person toileting, which includes using toliet, bedpan, or urinal?: A Little Help from another person bathing (including washing, rinsing, drying)?: A Little Help from another person to put on and taking off regular upper body clothing?: A Little Help from another person to put on and taking off regular lower body clothing?: A Little 6 Click Score: 19   End of Session Equipment Utilized During Treatment: Rolling walker (2 wheels) Nurse Communication: Mobility status  Activity Tolerance: Patient tolerated treatment well Patient left: in bed;with call bell/phone within reach  OT Visit Diagnosis: Other abnormalities of gait and mobility (R26.89);Muscle weakness (generalized) (M62.81)                Time: 1696-7893 OT Time Calculation (min): 19 min Charges:  OT General Charges $OT Visit: 1 Visit OT Evaluation $OT Eval Moderate Complexity: 1 Mod  Jolaine Artist, OT Acute Rehabilitation Services Pager [phone] Office [phone]   Delight Stare 05/31/2021, 1:46 PM

## 2021-05-31 NOTE — ED Notes (Signed)
Verbal report given to [facility] S RN at this time and pt moved from room 35 to room 38

## 2021-05-31 NOTE — ED Notes (Signed)
Pt verbalizes understanding of discharge instructions. Opportunity for questions and answers were provided. Pt discharged from the ED.   ?

## 2021-05-31 NOTE — H&P (Signed)
History and Physical    Patient: Victor Bryan UKG:[id number] DOB: 1980-12-27 DOA: 05/30/2021 DOS: the patient was seen and examined on 05/31/2021 PCP: Pcp, No  Patient coming from: Home  Chief Complaint:  Chief Complaint  Patient presents with   Weakness    HPI: Victor Bryan is a 41 y.o. male with medical history significant of HTN, paroxysmal atrial fibrillation not on anticoagulation, OSA not on CPAP, insulin-dependent type 2 diabetes who presents with concerns of lower extremity weakness, dizziness.  Patient was very drowsy and unable to provide history.  Spoke with family member over the phone who reports that for the past 3 to 4 days he has been having lower extremity weakness.  His legs seem to just buckle under him.  Family does not think he had paresthesia.  Also has been complaining of dizziness worse with movement.  Had to have help come out of the shower 1 day this week due to dizziness.  Also notes headache and associated nausea but no vomiting or diarrhea.  Patient recently moved here several days ago from [place] and has not slept well.  He reports occasional tobacco, alcohol use and marijuana use.  Although family is not aware that he uses marijuana.  In the ED, he was afebrile and hypertensive up to BP of 160/110.  CBC unremarkable.  BMP with sodium of 136, K of 3.7, BG of 223, creatinine of 2.41 from a prior of 2 several months ago.  MRI of the brain shows early subacute infarct of the mid right frontal lobe.  Negative MRI of her lumbar and thoracic spine.  Review of Systems: As mentioned in the history of present illness. All other systems reviewed and are negative. Past Medical History:  Diagnosis Date   Atrial fibrillation ([place])    Diabetes mellitus without complication ([place])    Hyperlipidemia    Hypertension    Migraine    Past Surgical History:  Procedure Laterality Date   TONSILLECTOMY     Social History:  reports that he has never smoked. He has never used  smokeless tobacco. He reports current alcohol use. He reports current drug use. Drug: Marijuana.  Allergies  Allergen Reactions   Morphine Itching    Other reaction(s): Unknown    History reviewed. No pertinent family history.  Prior to Admission medications   Medication Sig Start Date End Date Taking? Authorizing Provider  acetaminophen (TYLENOL) 500 MG tablet Take 500 mg by mouth every 6 (six) hours as needed for mild pain or moderate pain.   Yes [provider]  butalbital-acetaminophen-caffeine (FIORICET) 50-325-40 MG tablet Take 1 tablet by mouth every 8 (eight) hours as needed for headache or migraine. 03/20/21  Yes [provider]  BYDUREON BCISE 2 MG/0.85ML AUIJ Inject 2 mg into the skin once a week. 03/14/21  Yes [provider]  citalopram (CELEXA) 20 MG tablet Take 20 mg by mouth daily.   Yes [provider]  diltiazem (TIAZAC) 180 MG 24 hr capsule Take 180 mg by mouth daily. 08/28/20  Yes [provider]  furosemide (LASIX) 20 MG tablet Take 20 mg by mouth 2 (two) times daily. 03/14/21  Yes [provider]  gabapentin (NEURONTIN) 600 MG tablet Take 600 mg by mouth 3 (three) times daily. 03/14/21  Yes [provider]  hydrALAZINE (APRESOLINE) 25 MG tablet Take 25 mg by mouth 3 (three) times daily. 03/14/21  Yes [provider]  LEVEMIR FLEXTOUCH 100 UNIT/ML FlexTouch Pen Inject 50 Units into the skin  at bedtime. 12/23/20  Yes [provider]  NOVOLOG FLEXPEN 100 UNIT/ML FlexPen 18 Units 3 (three) times daily with meals. 03/15/21  Yes [provider]  omeprazole (PRILOSEC) 40 MG capsule Take 40 mg by mouth daily. 12/21/20  Yes [provider]  potassium chloride (KLOR-CON) 10 MEQ tablet Take 10 mEq by mouth 2 (two) times daily. 12/21/20  Yes [provider]  pravastatin (PRAVACHOL) 20 MG tablet Take 20 mg by mouth daily. 03/20/21  Yes [provider]    Physical  Exam: Vitals:   05/30/21 2045 05/30/21 2236 05/31/21 0000 05/31/21 0100  BP: (!) 160/108 (!) 164/112 (!) 192/123 (!) 179/113  Pulse: 90 86 88 87  Resp: 18 (!) 22 (!) 23 19  Temp:      SpO2: 99% 100% 99% 99%  Weight:      Height:       Constitutional: NAD, calm, ziti obese male laying flat in bed asleep Eyes: lids and conjunctivae normal ENMT: Mucous membranes are moist.  Neck: normal, supple Respiratory: clear to auscultation bilaterally, no wheezing, no crackles. Normal respiratory effort. No accessory muscle use.  Cardiovascular: Regular rate and rhythm, no murmurs / rubs / gallops.  Nonpitting edema bilateral ankles. Abdomen: no tenderness, Bowel sounds positive.  Musculoskeletal: no clubbing / cyanosis. No joint deformity upper and lower extremities.  Skin: no rashes, lesions, ulcers. No induration Neurologic: CN 2-12 grossly intact. Strength 5/5 in all 4.  Patient was very drowsy although awakes with light touch but would fall back asleep immediately. Psychiatric: Unable to fully assess due to drowsiness.  Data Reviewed:  EKG with normal sinus rhythm, inverted T wave seen in 2, aVF, V4 through V6.  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 Start statin if LDL >70. -PT/OT/SLT -Frequent neuro checks and keep on telemetry -neurology following and will order additional labs. Appreciate recommendations.   AKI Creatinine of 2.4 from baseline of 1.9-2 -Give continuous IV fluid infusion -Avoid nephrotoxic agent, contrasted study  Elevated troponin  Troponin of 35-->38. EKG with normal sinus rhythm, inverted T wave seen in 2, aVF, V4 through V6- no prior for comparison Likely demand from AKI -awaiting echocardiogram  Weakness/dizziness -unclear if this could all be explained by stroke but MRI lumbar and thoracic negative.  -Follow PT recs  tomorrow -check UDS-obtained after IV ativan in ED  -Check TSH   Paroxysmal atrial fibrillation - Not on anticoagulation reportedly due to noncompliance  HTN -elevated -Give IV hydralazine 10mg  X1 and PRN hydralazine -resume antihypertensives once verified   Insulin-dependent type 2 diabetes - Home regimen includes Levemir 50 units nightly and sliding scale Humalog  OSA Not on CPAP   Obesity BMI of 38   Advance Care Planning:   Code Status: Full Code   Consults: Neurology  Family Communication: Discussed with significant other over the phone  Severity of Illness: The appropriate patient status for this patient is OBSERVATION. Observation status is judged to be reasonable and necessary in order to provide the required intensity of service to ensure the patient's safety. The patient's presenting symptoms, physical exam findings, and initial radiographic and laboratory data in the context of their medical condition is felt to place them at decreased risk for further clinical deterioration. Furthermore, it is anticipated that the patient will be medically stable for discharge from the hospital within 2 midnights  of admission.   Author: Orene Desanctis, DO 05/31/2021 1:10 AM  For on call review [email or web address].

## 2021-05-31 NOTE — Progress Notes (Signed)
°   05/31/21 2104  TOC ED Mini Assessment  TOC Time spent with patient (minutes): 10  PING Used in TOC Assessment No  Admission or Readmission Diverted No  Interventions which prevented an admission or readmission [place] or Services;DME Provided  What brought you to the Emergency Department?  Patient c/o generalized weakness  Barriers to Discharge No Barriers Identified  CMS Medicare.gov Compare Post Acute Care list provided to: Patient  Choice offered to / list presented to  Patient   Met with patient to assist with obtaining Eliquis from the [facility].  Patient is being discharged with rolling walker and HHPT.  Patient is agreeable with discharge plan. Discuss HH with [facility]. Referral sent into via [facility] and patient was provided rolling walker prior to discharge. No further ED TOC need identified

## 2021-05-31 NOTE — Progress Notes (Signed)
Pt was seen for progression to walk after initially checking out concerns for vitals with dizziness at times.  Supine BP 175/114, sitting BP 145/95, standing 130/90.  Nursing agreed for therapy to see pt, and pt is not dizzy and not demonstrating severe LOB.  Did work on high level challenges and noted with very narrow BOS pt tends to list to R and at times posteriorly.  Pt is appropriate to go home with HHPT, a new RW and with his significant other being available for steadying as he walks.  Pt is motivated to continue to progress so recommend when HHPT stops,  he continue with outpatient therapy.    05/31/21 1100  PT Visit Information  Last PT Received On 05/31/21  Assistance Needed +1  History of Present Illness Pt is a 41 y/o male presenting on 2/14 with weakness, falls, dizziness. 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 Also with AKI, elevation of troponin and dehydration. PMH includes: afib, DM, HTN, migraine, OSA, .  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, production No difficulties  Pain Assessment  Pain Assessment Faces  Faces Pain Scale 4  Pain Location HA migraine no photophobia  Pain Descriptors / Indicators Guarding;Headache  Pain Intervention(s) Monitored during session  Cognition   Arousal/Alertness Awake/alert  Behavior During Therapy WFL for tasks assessed/performed  Overall Cognitive Status Impaired/Different from baseline  Area of Impairment Problem solving;Safety/judgement  Safety/Judgement Decreased awareness of safety;Decreased awareness of deficits  Problem Solving Requires verbal cues;Requires tactile cues  General Comments minor safety cues with balance testing  Upper Extremity Assessment  Upper Extremity Assessment Defer to OT evaluation  Lower Extremity Assessment  Lower Extremity Assessment Overall WFL for tasks assessed  Cervical / Trunk Assessment  Cervical / Trunk Assessment Normal  Bed Mobility  Overal bed mobility Needs Assistance  Bed Mobility Supine to Sit;Sit to Supine  Supine to sit Supervision  Sit to supine Supervision  General bed mobility comments no assistance to get up to side and demonstrated return briefly  Transfers  Overall transfer level Needs assistance  Equipment used None  Transfers Sit to/from Stand  Sit to Stand Min guard  General transfer comment min guard for safety  Ambulation/Gait  Ambulation/Gait assistance Min guard;Min assist  Gait Distance (Feet) 110 Feet  Assistive device Rolling walker (2 wheels)  Gait Pattern/deviations Step-through pattern;Decreased stride length  General Gait Details used walker to control lateral shifts and tendency to feel legs give out with longer walking  Gait velocity reduced  Gait velocity interpretation <1.31 ft/sec, indicative of household ambulator  Pre-gait activities standing balance screen  Modified Rankin (Stroke Patients Only)  Pre-Morbid Rankin Score 0  Modified Rankin 3  Balance  Overall balance assessment Needs assistance;History of Falls  Sitting-balance support Feet supported  Sitting balance-Leahy Scale Fair  Standing balance support Bilateral upper extremity supported;During functional activity  Standing balance-Leahy  Scale Fair  Standing balance comment fair  only initially, then dynamically is poor  General Comments  General comments (skin integrity, edema, etc.) not orthostatic, no issues with pulses or sats  Exercises  Exercises Other exercises (LE strength is grossly WFL)  PT - End of Session  Activity Tolerance Patient tolerated treatment well;Treatment limited secondary to medical complications (Comment)  Patient left with call bell/phone within reach;with nursing/sitter in room (sitting on side of bed)  Nurse Communication Mobility status  PT Assessment  PT Recommendation/Assessment Patient needs continued PT services  PT Visit Diagnosis Unsteadiness on feet (R26.81);Repeated falls (R29.6);Ataxic gait (R26.0)  PT Problem List Decreased activity tolerance;Decreased balance  PT Plan  PT Frequency (ACUTE ONLY) Min 4X/week  PT Treatment/Interventions (ACUTE ONLY) DME instruction;Gait training;Functional mobility training;Therapeutic activities;Therapeutic exercise;Stair training;Balance training;Neuromuscular re-education;Patient/family education  AM-PAC PT "6 Clicks" Mobility Outcome Measure (Version 2)  Help needed turning from your back to your side while in a flat bed without using bedrails? 4  Help needed moving from lying on your back to sitting on the side of a flat bed without using bedrails? 4  Help needed moving to and from a bed to a chair (including a wheelchair)? 3  Help needed standing up from a chair using your arms (e.g., wheelchair or bedside chair)? 3  Help needed to walk in hospital room? 3  Help needed climbing 3-5 steps with a railing?  2  6 Click Score 19  Consider Recommendation of Discharge To: Home with [facility]  Progressive Mobility  What is the highest level of mobility based on the progressive mobility assessment? Level 5 (Walks with assist in room/hall) - Balance while stepping forward/back and can walk in room with assist - Complete  Activity Ambulated with assistance in hallway  PT Recommendation  Follow Up  Recommendations Home health PT  Assistance recommended at discharge Intermittent Supervision/Assistance  Patient can return home with the following A little help with walking and/or transfers;A little help with bathing/dressing/bathroom;Assistance with cooking/housework;Assist for transportation  Functional Status Assessment Patient has had a recent decline in their functional status and demonstrates the ability to make significant improvements in function in a reasonable and predictable amount of time.  PT equipment Rolling walker (2 wheels)  Individuals Consulted  Consulted and Agree with Results and Recommendations Patient  Acute Rehab PT Goals  Patient Stated Goal to get home and back to work  PT Goal Formulation With patient  Time For Goal Achievement 06/14/21  Potential to Achieve Goals Good  PT Time Calculation  PT Start Time (ACUTE ONLY) 1055  PT Stop Time (ACUTE ONLY) 1120  PT Time Calculation (min) (ACUTE ONLY) 25 min  PT General Charges  $$ ACUTE PT VISIT 1 Visit  PT Evaluation  $PT Eval Moderate Complexity 1 Mod  PT Treatments  $Gait Training 8-22 mins  Written Expression  Dominant Hand Right  Mee Hives, PT PhD Acute Rehab Dept. Number: [place]

## 2021-05-31 NOTE — ED Notes (Signed)
Fund pt standing at side of bed using urinal without any assistance

## 2021-05-31 NOTE — Consult Note (Signed)
NEUROLOGY CONSULTATION NOTE   Date of service: May 31, 2021 Patient Name: Victor Bryan MRN:  [id number] DOB:  11-08-1980 Reason for consult: "R frontal strokes" Requesting Provider: Orene Desanctis, DO _ _ _   _ __   _ __ _ _  __ __   _ __   __ _  History of Present Illness  Victor Bryan is a 41 y.o. male with PMH significant for pAfibb not on [facility], poorly controlled DM2, HTN, migraine who presents with few days history of poor po intake, headache, lightheadedness, BL lower extremity weakness.  Reports has not been eating and drinking well due to lack of apetite. He does endorse feeling a little off balance at baseline but since Friday, he has been more and more wobbly on his feet. He fell twice yesterday. Fiance had to help him get out of shower. Feels like his knees are going to give out. He is very light headed when he gets up.  Smokes marijuana occasionally, no alcohol intake. Eats only once a day due to lack of apetitie. Endorses diarrhea, threw up on Friday too. No urinary or bowel incontinence, no saddle anesthesia. Does not endorse lhermitte's sign.  Work-up here with elevated creatinine was consistent with AKI, mildly elevated troponin.  MRI brain with a subcentimeter mid right frontal lobe subacute infarct.  Neurology was consulted for further evaluation and work-up for stroke and for bilateral lower extremity weakness.   ROS   Constitutional Denies weight loss, fever and chills.   HEENT Denies changes in vision and hearing.   Respiratory Denies SOB and cough.   CV Denies palpitations and CP   GI Denies abdominal pain, endorses nausea, vomiting and diarrhea.   GU Denies dysuria and urinary frequency.   MSK Denies myalgia and joint pain.  Skin Denies rash and pruritus.   Neurological Mild headache but no syncope.   Psychiatric Denies recent changes in mood. Denies anxiety and depression.    Past History   Past Medical History:  Diagnosis Date   Atrial fibrillation  ([place])    Diabetes mellitus without complication ([place])    Hyperlipidemia    Hypertension    Migraine    Past Surgical History:  Procedure Laterality Date   TONSILLECTOMY     History reviewed. No pertinent family history. Social History   Socioeconomic History   Marital status: Single    Spouse name: Not on file   Number of children: Not on file   Years of education: Not on file   Highest education level: Not on file  Occupational History   Not on file  Tobacco Use   Smoking status: Never   Smokeless tobacco: Never  Substance and Sexual Activity   Alcohol use: Yes   Drug use: Yes    Types: Marijuana   Sexual activity: Yes  Other Topics Concern   Not on file  Social History Narrative   Not on file   Social Determinants of Health   Financial Resource Strain: Not on file  Food Insecurity: Not on file  Transportation Needs: Not on file  Physical Activity: Not on file  Stress: Not on file  Social Connections: Not on file   Allergies  Allergen Reactions   Morphine Itching    Other reaction(s): Unknown    Medications  (Not in a hospital admission)    Vitals   Vitals:   05/30/21 2045 05/30/21 2236 05/31/21 0000 05/31/21 0100  BP: (!) 160/108 (!) 164/112 (!) 192/123 (!) 179/113  Pulse: 90 86 88 87  Resp: 18 (!) 22 (!) 23 19  Temp:      SpO2: 99% 100% 99% 99%  Weight:      Height:         Body mass index is 38.4 kg/m.  Physical Exam   General: Laying comfortably in bed; in no acute distress.  HENT: Normal oropharynx and mucosa. Normal external appearance of ears and nose.  Neck: Supple, no pain or tenderness  CV: No JVD. No peripheral edema.  Pulmonary: Symmetric Chest rise. Normal respiratory effort.  Abdomen: Soft to touch, non-tender.  Ext: No cyanosis, edema, or deformity  Skin: No rash. Normal palpation of skin.   Musculoskeletal: Normal digits and nails by inspection. No clubbing.   Neurologic Examination  Mental status/Cognition: Alert,  oriented to self, place, month and year, good attention.  Speech/language: Fluent, comprehension intact, object naming intact, repetition intact.  Cranial nerves:   CN II L pupil is slightly oblong and larger than R pupil.Both are reactive to light, no VF deficits    CN III,IV,VI EOM intact, no gaze preference or deviation, no nystagmus    CN V normal sensation in V1, V2, and V3 segments bilaterally    CN VII no asymmetry, no nasolabial fold flattening    CN VIII normal hearing to speech    CN IX & X normal palatal elevation, no uvular deviation    CN XI 5/5 head turn and 5/5 shoulder shrug bilaterally    CN XII midline tongue protrusion    Motor:  Muscle bulk: normal, tone normal, pronator drift none tremor none Mvmt Root Nerve  Muscle Right Left Comments  SA C5/6 Ax Deltoid 5 5   EF C5/6 Mc Biceps 5 5   EE C6/7/8 Rad Triceps 5 5   WF C6/7 Med FCR     WE C7/8 PIN ECU     F Ab C8/T1 U ADM/FDI 5 5   HF L1/2/3 Fem Illopsoas 5 5   KE L2/3/4 Fem Quad 5 5   DF L4/5 D Peron Tib Ant 5 5   PF S1/2 Tibial Grc/Sol 5 5    Reflexes:  Right Left Comments  Pectoralis      Biceps (C5/6) 2 2   Brachioradialis (C5/6) 2 2    Triceps (C6/7) 2 2    Patellar (L3/4) 2 2    Achilles (S1) 1 1    Hoffman      Plantar withdraws withdraws   Jaw jerk    Sensation:  Light touch Intact throughout   Pin prick    Temperature    Vibration Decreased in his feet BL  Proprioception    Coordination/Complex Motor:  - Finger to Nose intact BL - Heel to shin intact BL - Rapid alternating movement are normal - Gait: Deferred.  Labs   CBC:  Recent Labs  Lab 05/30/21 1637 05/31/21 0211  WBC 8.9 9.4  NEUTROABS 6.5  --   HGB 13.1 13.1  HCT 39.9 38.0*  MCV 77.5* 76.3*  PLT 280 778    Basic Metabolic Panel:  Lab Results  Component Value Date   NA 136 05/30/2021   K 3.7 05/30/2021   CO2 23 05/30/2021   GLUCOSE 223 (H) 05/30/2021   BUN 12 05/30/2021   CREATININE 2.41 (H) 05/30/2021    CALCIUM 9.0 05/30/2021   GFRNONAA 34 (L) 05/30/2021   Lipid Panel: No results found for: LDLCALC HgbA1c: No results found for: HGBA1C Urine Drug Screen: No  results found for: LABOPIA, COCAINSCRNUR, LABBENZ, AMPHETMU, THCU, LABBARB  Alcohol Level No results found for: [place]  CT Head without contrast(Personally reviewed): CTH was negative for a large hypodensity concerning for a large territory infarct or hyperdensity concerning for an ICH  MR Angio head without contrast and MR angio neck with contrast:  Pending  MRI Brain(Personally reviewed): Subcentimeter subacute right mid frontal lobe infarct  Impression   Victor Bryan is a 41 y.o. male with PMH significant for P A-fib not on anticoagulation, poorly controlled diabetes and hypertension who presents with few days history of lightheadedness along with wobbly and off balance on his feet and headache. His neurologic examination is notable for no focal deficits with decreased vibratory sensation in his feet.  He was found to have a subcentimeter mid right frontal lobe stroke on MRI brain which I think is coincidental and does not explain his presentation.  I suspect that the stroke is likely related to poorly controlled risk factors.  As for his bilateral lower extremity weakness, my suspicion is that this is multifactorial.  He endorses feeling weak in his legs and tired in general for several weeks and then lightheaded and feeling like his knees are going to buckle for a few days. Given his poor po intake, I suspect that this is likely a combination of vitamin deficiency along with dehydration and AKI. Unliekly for this to be GBS given no ascending weakness, no hyporeflexia.  Recommendations   BL lower extremity weakness: - Vit B12 with MMA, folate, TSH, Vit B1, RPR, Vit B6, HIV. - Orthostatic vitals x 1  R mid frontal lobe stroke: - Frequent Neuro checks per stroke unit protocol - Recommend Vascular imaging with MRA Angio Head  without contrast and MR angio neck with contrast. - Recommend obtaining TTE - Recommend obtaining Lipid panel with LDL - Please start statin if LDL > 70 - Recommend HbA1c - Antithrombotic - Aspirin 81mg  daily. - Recommend DVT ppx - SBP goal - permissive hypertension first 24 h < 220/110. Held home meds.  - Recommend Telemetry monitoring for arrythmia - Recommend bedside swallow screen prior to PO intake. - Stroke education booklet - Recommend PT/OT/SLP consult - Recommend Urine Tox screen.  ______________________________________________________________________  Plan discussed with Dr. Flossie Buffy with the Hospitalist team in person.  Thank you for the opportunity to take part in the care of this patient. If you have any further questions, please contact the neurology consultation attending.  Signed,  [place] Pager Number [phone] _ _ _   _ __   _ __ _ _  __ __   _ __   __ _

## 2021-06-06 ENCOUNTER — Telehealth: Payer: Self-pay | Admitting: Pharmacist

## 2021-06-06 NOTE — Telephone Encounter (Signed)
Pharmacy Transitions of Care Follow-up Telephone Call  Date of discharge: 05/31/21  Discharge Diagnosis: Stroke  How have you been since you were released from the hospital? Good   Medication changes made at discharge:  - START: Eliquis 5mg  tablets twice daily.  Medication changes verified by the patient? Yes    Medication Accessibility:  Home Pharmacy:    Was the patient provided with refills on discharged medications? Yes, 2   Have all prescriptions been transferred from [facility] to home pharmacy? No   Is the patient able to afford medications?  Pt has insurance and Eliquis was covered.    Medication Review:   APIXABAN (ELIQUIS)  Apixaban 5 mg BID initiated on 05/31/21. - Discussed importance of taking medication around the same time everyday  - Reviewed potential DDIs with patient  - Advised patient of medications to avoid (NSAIDs, ASA)  - Educated that Tylenol (acetaminophen) will be the preferred analgesic to prevent risk of bleeding  - Emphasized importance of monitoring for signs and symptoms of bleeding (abnormal bruising, prolonged bleeding, nose bleeds, bleeding from gums, discolored urine, black tarry stools)  - Advised patient to alert all providers of anticoagulation therapy prior to starting a new medication or having a procedure   Follow-up Appointments:  PCP Hospital f/u appt confirmed?  Follow up with Christiana Fuchs, DO on 06/19/21 @ 1:15 for PCP establishment. Specialty: Internal Medicine  If their condition worsens, is the pt aware to call PCP or go to the Emergency Dept.? Yes  Final Patient Assessment: Pt has been on medication before. Understands it well, plans to keep apt with new PCP, can afford co-pays with insurance, no questions or concerns.

## 2021-06-19 ENCOUNTER — Encounter: Payer: Medicare (Managed Care) | Admitting: Internal Medicine

## 2021-08-01 ENCOUNTER — Encounter (HOSPITAL_COMMUNITY): Payer: Self-pay | Admitting: Emergency Medicine

## 2021-08-01 ENCOUNTER — Emergency Department (HOSPITAL_COMMUNITY): Payer: Medicare (Managed Care)

## 2021-08-01 ENCOUNTER — Emergency Department (HOSPITAL_COMMUNITY)
Admission: EM | Admit: 2021-08-01 | Discharge: 2021-08-01 | Disposition: A | Discharge status: Home / Self Care | Payer: Medicare (Managed Care) | Attending: Emergency Medicine | Admitting: Emergency Medicine

## 2021-08-01 ENCOUNTER — Other Ambulatory Visit: Payer: Self-pay

## 2021-08-01 DIAGNOSIS — Z7901 Long term (current) use of anticoagulants: Secondary | ICD-10-CM | POA: Diagnosis not present

## 2021-08-01 DIAGNOSIS — E119 Type 2 diabetes mellitus without complications: Secondary | ICD-10-CM | POA: Diagnosis not present

## 2021-08-01 DIAGNOSIS — Z79899 Other long term (current) drug therapy: Secondary | ICD-10-CM | POA: Diagnosis not present

## 2021-08-01 DIAGNOSIS — I10 Essential (primary) hypertension: Secondary | ICD-10-CM | POA: Diagnosis not present

## 2021-08-01 DIAGNOSIS — I159 Secondary hypertension, unspecified: Secondary | ICD-10-CM | POA: Insufficient documentation

## 2021-08-01 DIAGNOSIS — I480 Paroxysmal atrial fibrillation: Secondary | ICD-10-CM | POA: Insufficient documentation

## 2021-08-01 DIAGNOSIS — R519 Headache, unspecified: Secondary | ICD-10-CM | POA: Diagnosis present

## 2021-08-01 DIAGNOSIS — Z794 Long term (current) use of insulin: Secondary | ICD-10-CM | POA: Insufficient documentation

## 2021-08-01 DIAGNOSIS — G4489 Other headache syndrome: Secondary | ICD-10-CM | POA: Insufficient documentation

## 2021-08-01 LAB — COMPREHENSIVE METABOLIC PANEL
ALT: 15 U/L (ref 0–44)
AST: 21 U/L (ref 15–41)
Albumin: 2.5 g/dL — ABNORMAL LOW (ref 3.5–5.0)
Alkaline Phosphatase: 69 U/L (ref 38–126)
Anion gap: 7 (ref 5–15)
BUN: 26 mg/dL — ABNORMAL HIGH (ref 6–20)
CO2: 21 mmol/L — ABNORMAL LOW (ref 22–32)
Calcium: 8.5 mg/dL — ABNORMAL LOW (ref 8.9–10.3)
Chloride: 110 mmol/L (ref 98–111)
Creatinine, Ser: 2.11 mg/dL — ABNORMAL HIGH (ref 0.61–1.24)
GFR, Estimated: 39 mL/min — ABNORMAL LOW (ref >60)
Glucose, Bld: 216 mg/dL — ABNORMAL HIGH (ref 70–99)
Potassium: 4.1 mmol/L (ref 3.5–5.1)
Sodium: 138 mmol/L (ref 135–145)
Total Bilirubin: 0.3 mg/dL (ref 0.3–1.2)
Total Protein: 5.9 g/dL — ABNORMAL LOW (ref 6.5–8.1)

## 2021-08-01 LAB — CBC WITH DIFFERENTIAL/PLATELET
Abs Immature Granulocytes: 0.04 10*3/uL (ref 0.00–0.07)
Basophils Absolute: 0.1 10*3/uL (ref 0.0–0.1)
Basophils Relative: 1 %
Eosinophils Absolute: 0.2 10*3/uL (ref 0.0–0.5)
Eosinophils Relative: 2 %
HCT: 43.1 % (ref 39.0–52.0)
Hemoglobin: 13.8 g/dL (ref 13.0–17.0)
Immature Granulocytes: 0 %
Lymphocytes Relative: 16 %
Lymphs Abs: 1.6 10*3/uL (ref 0.7–4.0)
MCH: 25.2 pg — ABNORMAL LOW (ref 26.0–34.0)
MCHC: 32 g/dL (ref 30.0–36.0)
MCV: 78.6 fL — ABNORMAL LOW (ref 80.0–100.0)
Monocytes Absolute: 0.4 10*3/uL (ref 0.1–1.0)
Monocytes Relative: 4 %
Neutro Abs: 7.5 10*3/uL (ref 1.7–7.7)
Neutrophils Relative %: 77 %
Platelets: 265 10*3/uL (ref 150–400)
RBC: 5.48 MIL/uL (ref 4.22–5.81)
RDW: 13.7 % (ref 11.5–15.5)
WBC: 9.7 10*3/uL (ref 4.0–10.5)
nRBC: 0 % (ref 0.0–0.2)

## 2021-08-01 IMAGING — CT CT HEAD W/O CM
4 series · 16 of 47 positions shown, 18 images · non-contrast
Comparison: Head CT [DATE]

CLINICAL DATA: Sudden onset severe headache, migraine.



[Series 3: head wo · axial · 0.42mm/px · z∈[-112,+8]mm · 7 of 32 slices shown, 9 images]
[im 4/32  brain]
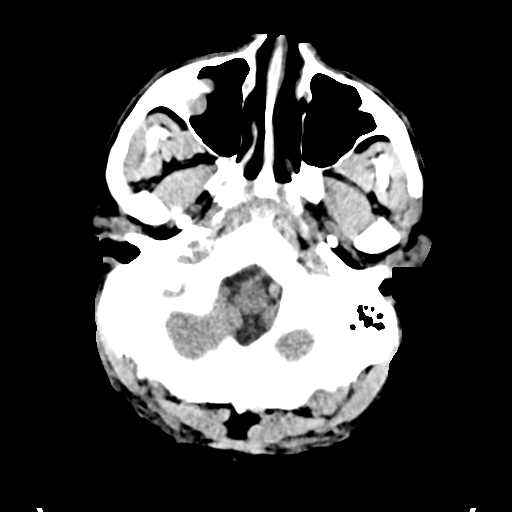
[im 4/32  bone]
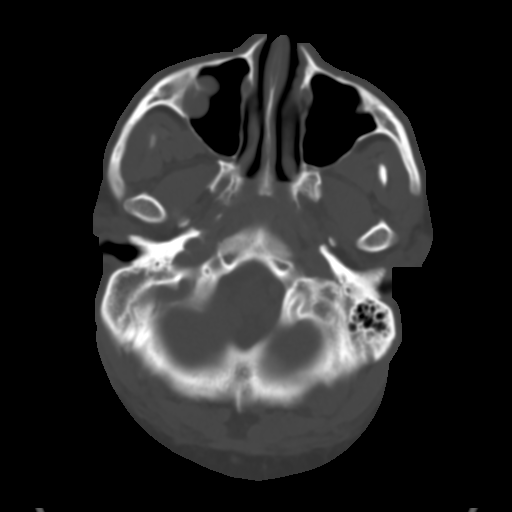
[im 8/32  brain]
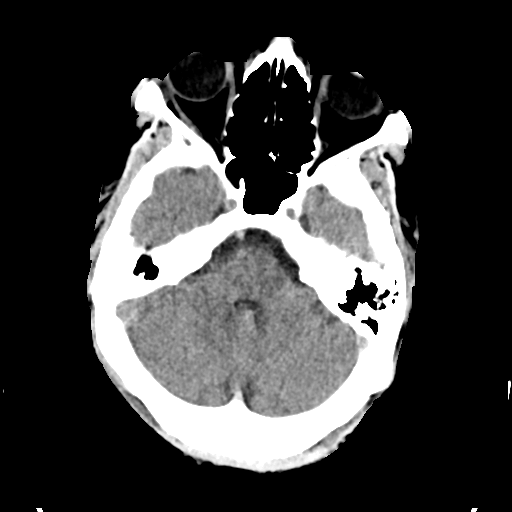
[im 12/32  brain]
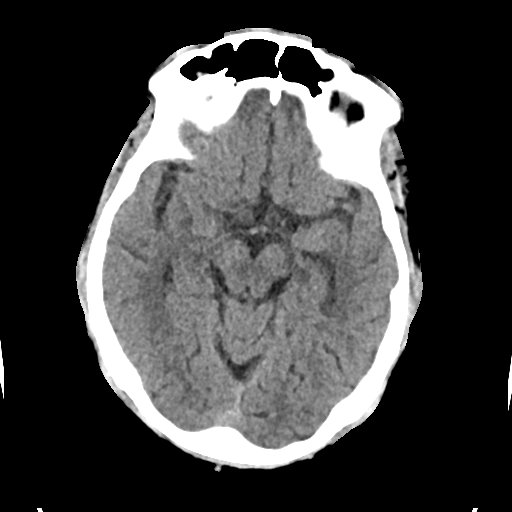
[im 16/32  brain]
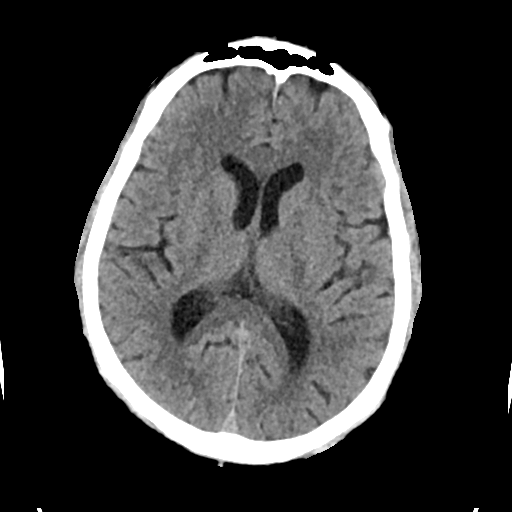
[im 20/32  brain]
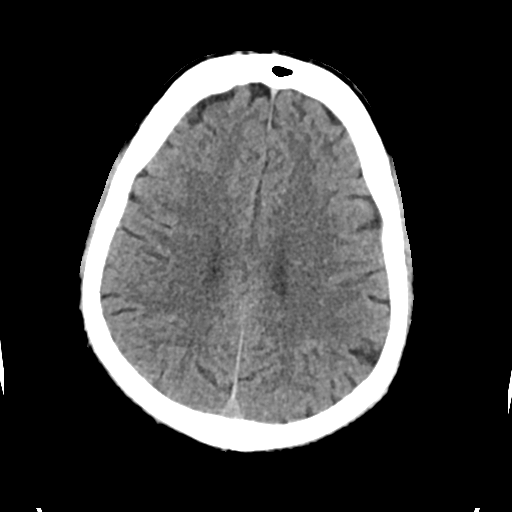
[im 20/32  bone]
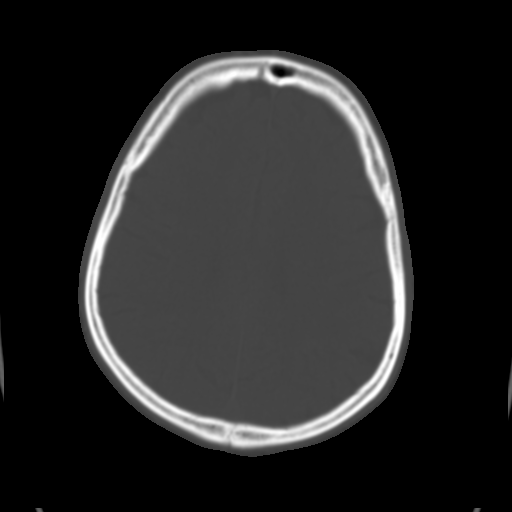
[im 24/32  brain]
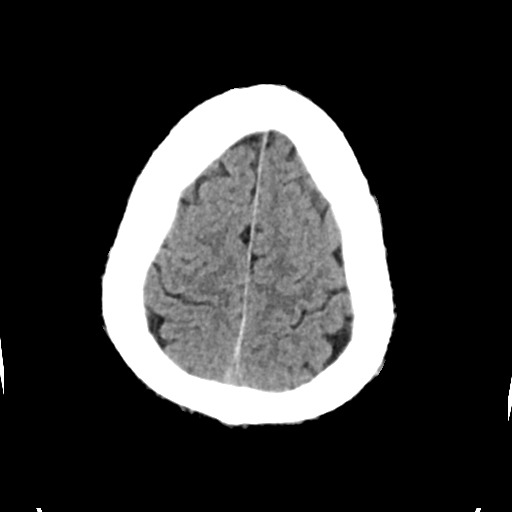
[im 28/32  brain]
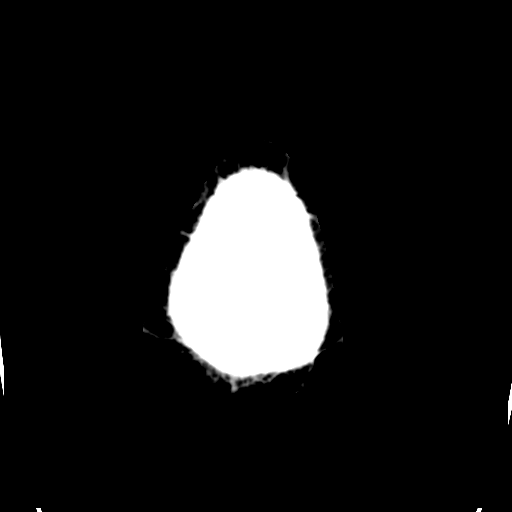

[Series 4: head bone · axial · 0.42mm/px · z∈[-112,-80]mm · 3 of 80 slices shown]
[im 8/80  bone]
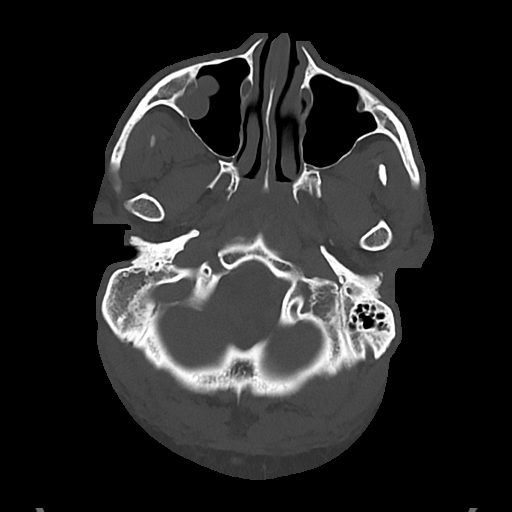
[im 16/80  bone]
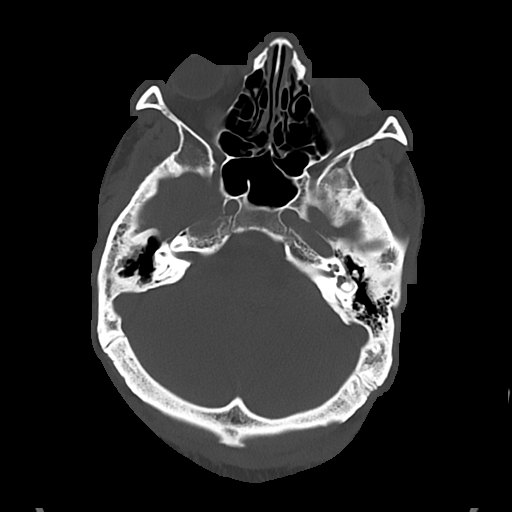
[im 24/80  bone]
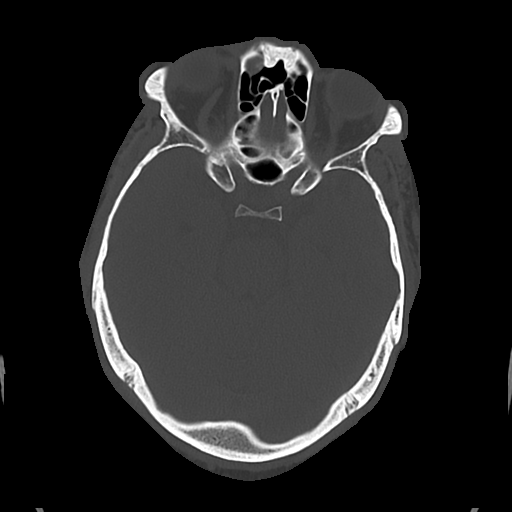

[Series 5: cor soft · coronal · 0.30mm/px · 3 of 71 slices shown]
[im 24/71  brain]
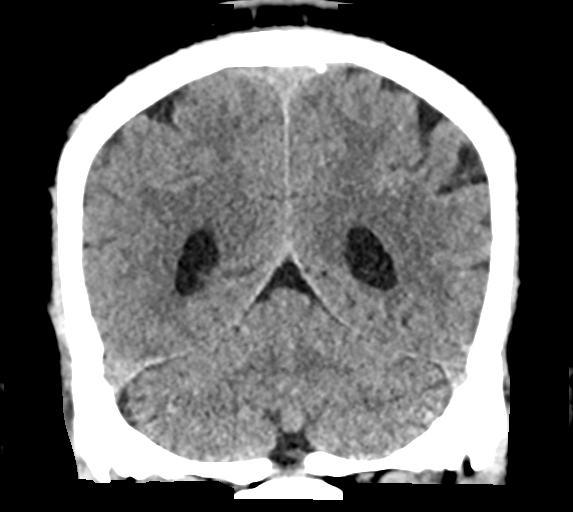
[im 32/71  brain]
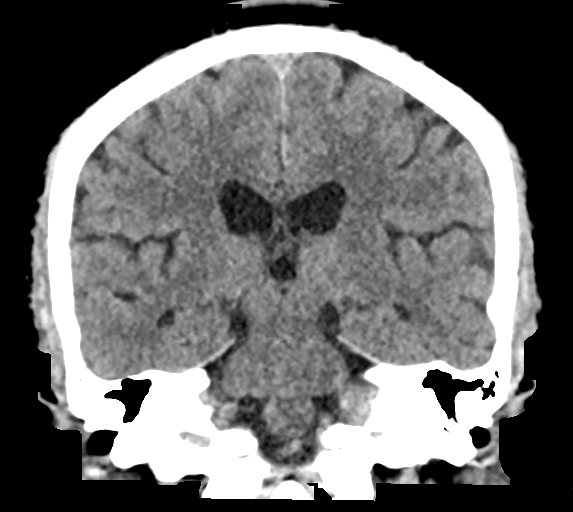
[im 39/71  brain]
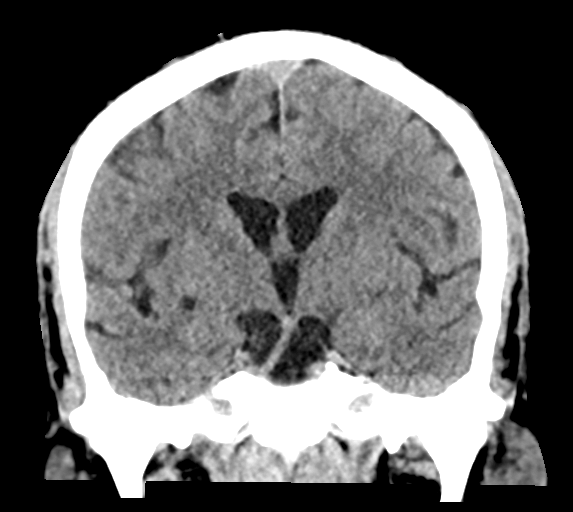

[Series 6: sag soft · sagittal · 0.29mm/px · 3 of 61 slices shown]
[im 21/61  brain]
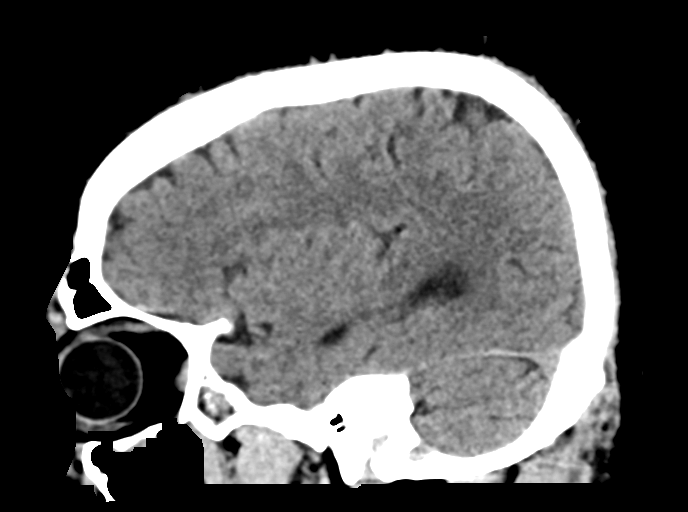
[im 31/61  brain]
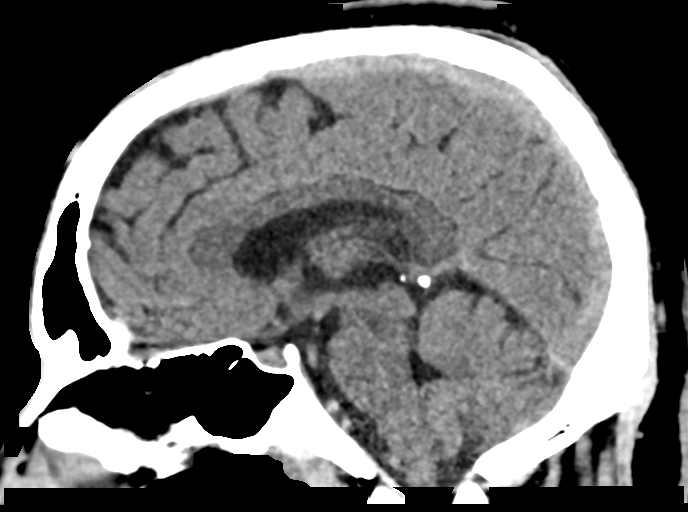
[im 41/61  brain]
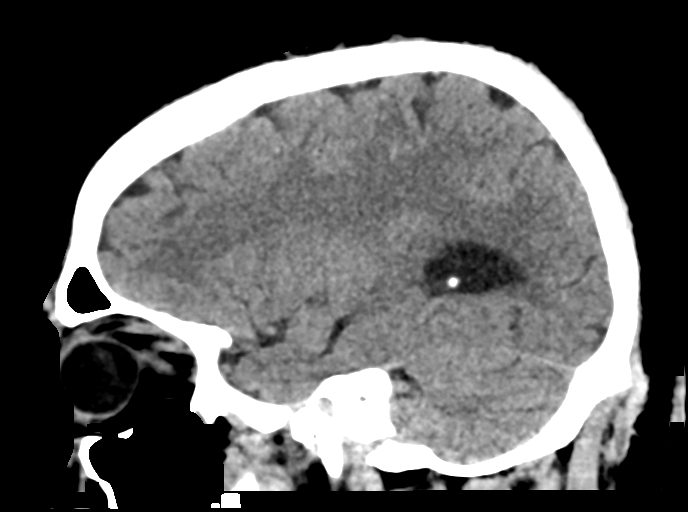

[16 of 47 positions shown; findings below may reference images not displayed]

FINDINGS: Brain: No evidence of acute infarction, hemorrhage, hydrocephalus,
extra-axial collection or mass lesion/mass effect.

Vascular: No hyperdense vessel or unexpected calcification.

Skull: Normal. Negative for fracture or focal lesion.

Sinuses/Orbits: Mucous retention cyst in the right maxillary and
right frontal sinus.

Other: Mastoid air cells are predominantly clear.
IMPRESSION: 1. No acute intracranial abnormality.
2. Mucous retention cysts in the right maxillary and right frontal
sinus.

## 2021-08-01 MED ORDER — DIPHENHYDRAMINE HCL 50 MG/ML IJ SOLN
50.0000 mg | Freq: Once | INTRAMUSCULAR | Status: AC
  Administered 2021-08-01: 50 mg via INTRAVENOUS
  Filled 2021-08-01: qty 1

## 2021-08-01 MED ORDER — METOCLOPRAMIDE HCL 5 MG/ML IJ SOLN
5.0000 mg | Freq: Once | INTRAMUSCULAR | Status: AC
  Administered 2021-08-01: 5 mg via INTRAVENOUS
  Filled 2021-08-01: qty 2

## 2021-08-01 MED ORDER — PROCHLORPERAZINE MALEATE 10 MG PO TABS: 10.0000 mg | ORAL_TABLET | Freq: Two times a day (BID) | ORAL | 0 refills | Status: DC | PRN

## 2021-08-01 MED ORDER — HYDROMORPHONE HCL 1 MG/ML IJ SOLN
1.0000 mg | Freq: Once | INTRAMUSCULAR | Status: AC
  Administered 2021-08-01: 1 mg via INTRAVENOUS
  Filled 2021-08-01: qty 1

## 2021-08-01 MED ORDER — PROCHLORPERAZINE EDISYLATE 10 MG/2ML IJ SOLN
10.0000 mg | Freq: Once | INTRAMUSCULAR | Status: AC
Start: 2021-08-01 — End: 2021-08-01
  Administered 2021-08-01: 10 mg via INTRAVENOUS
  Filled 2021-08-01: qty 2

## 2021-08-01 MED ORDER — SODIUM CHLORIDE 0.9 % IV BOLUS
1000.0000 mL | Freq: Once | INTRAVENOUS | Status: AC
  Administered 2021-08-01: 1000 mL via INTRAVENOUS

## 2021-08-01 NOTE — ED Provider Notes (Signed)
5:26 PM ?Care assumed from Dr. Eulis Foster.  At time of transfer care, patient is awaiting for reassessment after medications and head CT.  Patient reportedly had significant headache after leaving a meeting this afternoon and he is on Eliquis for previous paroxysmal atrial fibrillation.  There was reportedly no neurologic deficits or complaints. ? ?If symptoms have improved and CT head is reassuring, plan of care will be to discharge home. ? ?6:02 PM ?I reassessed patient after CT head returned reassuring.  Patient reports he still having a 7 or 8 out of 10 headache.  He reports he did not think the Reglan seem to help.  We will try different medicine with Compazine Benadryl and fluids for new headache cocktail and then reassess.  If symptoms start to improve, anticipate discharge home and patient agrees with this plan. ? ?11:22 PM ?Headache has completely resolved now.  Patient is feeling better.  Blood pressure has improved.  Patient will be discharged home with prescription for Compazine and we instructed him to use Benadryl for extraperitoneal symptoms.  He agrees with follow-up with PCP and no other questions or concerns.  Patient discharged in good condition with resolution of symptoms of headache. ? ? ?Clinical Impression: ?1. Other headache syndrome   ?2. Secondary hypertension   ? ? ?Disposition: Discharge ? ?Condition: Good ? ?I have discussed the results, Dx and Tx plan with the pt(& family if present). He/she/they expressed understanding and agree(s) with the plan. Discharge instructions discussed at great length. Strict return precautions discussed and pt &/or family have verbalized understanding of the instructions. No further questions at time of discharge.  ? ? ?New Prescriptions  ? PROCHLORPERAZINE (COMPAZINE) 10 MG TABLET    Take 1 tablet (10 mg total) by mouth 2 (two) times daily as needed for nausea or vomiting (and headache).  ? ? ?Follow Up: ?[facility] ?[place] [address] ?[phone] ?Schedule an appointment as soon as possible for a visit  ? ? ?[place] ?[address] ?[phone] mc ?[place] ?[phone] ? ? ? ? ?  ?Atharva Mirsky, Gwenyth Allegra, MD ?08/01/21 2324 ? ?

## 2021-08-01 NOTE — Discharge Instructions (Signed)
Your history, exam, and work-up today are consistent with severe headache however the CT head was reassuring and your headache resolved after headache medications.  We feel you are safe for discharge home.  Please continue your home blood pressure medicine and follow-up with your primary team.  Please use the Compazine to help with the nausea and headache and if you have any agitation with it please take some Benadryl.  Please rest and stay hydrated.  If any symptoms change or worsen acutely, please return to the nearest emergency department. ?

## 2021-08-01 NOTE — ED Triage Notes (Signed)
Pt with migraine today onset while in a meeting, has gotten worse since onset. Reports this is worse than his normal headaches and has associated nausea and eye pain. Pt hypertensive with hx of same as well, systolic >437 despite taking his meds today.   ?

## 2021-08-01 NOTE — ED Notes (Signed)
Patient transported to CT 

## 2021-08-01 NOTE — ED Provider Notes (Signed)
?Victor Bryan ?Provider Note ? ? ?CSN: [id number] ?Arrival date & time: 08/01/21  1404 ? ?  ? ?History ? ?Chief Complaint  ?Patient presents with  ? Migraine  ? ? ?Dequann Bryan is a 41 y.o. male. ? ?HPI ?He reports onset of headache after meeting prior to arrival here today.  Headache on and off for 1 month but it is worse today than usual.  He is taking his prescribed medications, hydralazine and carvedilol.  He has had prior "migraine headaches."  He feels generally weak and had some abdominal pain yesterday but it resolved spontaneously.  He denies blurred vision, paresthesia or focal weakness. ? ?Home Medications ?Prior to Admission medications   ?Medication Sig Start Date End Date Taking? Authorizing Provider  ?acetaminophen (TYLENOL) 500 MG tablet Take 500 mg by mouth every 6 (six) hours as needed for mild pain or moderate pain.    [provider]  ?apixaban (ELIQUIS) 5 MG TABS tablet Take 1 tablet (5 mg total) by mouth 2 (two) times daily. 05/31/21   Cherene Altes, MD  ?butalbital-acetaminophen-caffeine (FIORICET) [phone] MG tablet Take 1 tablet by mouth every 8 (eight) hours as needed for headache or migraine. 03/20/21   [provider]  ?Serina Cowper 2 MG/0.85ML AUIJ Inject 2 mg into the skin once a week. 03/14/21   [provider]  ?carvedilol (COREG) 12.5 MG tablet Take 12.5 mg by mouth 2 (two) times daily. 06/16/21   [provider]  ?citalopram (CELEXA) 20 MG tablet Take 20 mg by mouth daily.    [provider]  ?citalopram (CELEXA) 40 MG tablet Take 40 mg by mouth daily. 06/14/21   [provider]  ?diltiazem (TIAZAC) 180 MG 24 hr capsule Take 180 mg by mouth daily. 08/28/20   [provider]  ?furosemide (LASIX) 20 MG tablet Take 20 mg by mouth 2 (two) times daily. 03/14/21   [provider]  ?gabapentin (NEURONTIN) 600 MG tablet Take 600 mg by mouth 3 (three) times daily. 03/14/21    [provider]  ?hydrALAZINE (APRESOLINE) 25 MG tablet Take 25 mg by mouth 3 (three) times daily. 03/14/21   [provider]  ?hydrALAZINE (APRESOLINE) 50 MG tablet Take 50 mg by mouth 3 (three) times daily. 06/16/21   [provider]  ?LEVEMIR FLEXTOUCH 100 UNIT/ML FlexTouch Pen Inject 50 Units into the skin at bedtime. 12/23/20   [provider]  ?NOVOLOG FLEXPEN 100 UNIT/ML FlexPen 18 Units 3 (three) times daily with meals. 03/15/21   [provider]  ?omeprazole (PRILOSEC) 40 MG capsule Take 40 mg by mouth daily. 12/21/20   [provider]  ?pantoprazole (PROTONIX) 40 MG tablet Take 40 mg by mouth 2 (two) times daily. 06/13/21   [provider]  ?potassium chloride (KLOR-CON) 10 MEQ tablet Take 10 mEq by mouth 2 (two) times daily. 12/21/20   [provider]  ?sildenafil (REVATIO) 20 MG tablet Take 40-60 mg by mouth daily as needed. 06/17/21   [provider]  ?   ? ?Allergies    ?Morphine   ? ?Review of Systems   ?Review of Systems ? ?Physical Exam ?Updated Vital Signs ?BP (!) 189/133   Pulse 94   Temp 98.4 ?F (36.9 ?C) (Oral)   Resp 18   Ht 5\' 9"  (1.753 m)   Wt 117.9 kg   SpO2 100%   BMI 38.40 kg/m?  ?Physical Exam ?Vitals and nursing note reviewed.  ?Constitutional:   ?  General: He is not in acute distress. ?   Appearance: He is well-developed. He is not ill-appearing or diaphoretic.  ?HENT:  ?   Head: Normocephalic and atraumatic.  ?   Right Ear: External ear normal.  ?   Left Ear: External ear normal.  ?Eyes:  ?   Conjunctiva/sclera: Conjunctivae normal.  ?   Pupils: Pupils are equal, round, and reactive to light.  ?Neck:  ?   Trachea: Phonation normal.  ?Cardiovascular:  ?   Rate and Rhythm: Normal rate and regular rhythm.  ?   Heart sounds: Normal heart sounds.  ?Pulmonary:  ?   Effort: Pulmonary effort is normal.  ?   Breath sounds: Normal breath sounds.  ?Abdominal:  ?   General: There is no distension.  ?   Palpations:  Abdomen is soft.  ?   Tenderness: There is no abdominal tenderness.  ?Musculoskeletal:     ?   General: Normal range of motion.  ?   Cervical back: Normal range of motion and neck supple.  ?Skin: ?   General: Skin is warm and dry.  ?Neurological:  ?   Mental Status: He is alert and oriented to person, place, and time.  ?   Cranial Nerves: No cranial nerve deficit.  ?   Sensory: No sensory deficit.  ?   Motor: No abnormal muscle tone.  ?   Coordination: Coordination normal.  ?   Comments: No dysarthria or aphasia.  Normal strength, arms and legs bilaterally.  ?Psychiatric:     ?   Mood and Affect: Mood normal.     ?   Behavior: Behavior normal.     ?   Thought Content: Thought content normal.     ?   Judgment: Judgment normal.  ? ? ?ED Results / Procedures / Treatments   ?Labs ?(all labs ordered are listed, but only abnormal results are displayed) ?Labs Reviewed  ?COMPREHENSIVE METABOLIC PANEL - Abnormal; Notable for the following components:  ?    Result Value  ? CO2 21 (*)   ? Glucose, Bld 216 (*)   ? BUN 26 (*)   ? Creatinine, Ser 2.11 (*)   ? Calcium 8.5 (*)   ? Total Protein 5.9 (*)   ? Albumin 2.5 (*)   ? GFR, Estimated 39 (*)   ? All other components within normal limits  ?CBC WITH DIFFERENTIAL/PLATELET - Abnormal; Notable for the following components:  ? MCV 78.6 (*)   ? MCH 25.2 (*)   ? All other components within normal limits  ? ? ?EKG ?None ? ?Radiology ?No results found. ? ?Procedures ?Procedures  ? ? ?Medications Ordered in ED ?Medications  ?HYDROmorphone (DILAUDID) injection 1 mg (1 mg Intravenous Given 08/01/21 1547)  ?metoCLOPramide (REGLAN) injection 5 mg (5 mg Intravenous Given 08/01/21 1546)  ? ? ?ED Course/ Medical Decision Making/ A&P ?  ?                        ?Medical Decision Making ?Patient presenting for evaluation of headache, onset today after leaving a work meeting.  Blood pressure elevated on arrival. ? ?Amount and/or Complexity of Data Reviewed ?External Data Reviewed: notes. ?    Details: Hospitalization, 1 month ago, summary of findings: ?Bilateral lower extremity weakness - dizziness ?TIA v/s punctate CVA mid right frontal lobe ?Hyperlipidemia ?Acute kidney injury ?Paroxysmal atrial fibrillation ?HTN ?DM2 -uncontrolled with hyperglycemia ?OSA not on CPAP ?Obesity - Body mass index is 38.4  kg/m?. ?Labs: ordered. ?   Details: CBC, metabolic panel-glucose and creatinine high, unchanged from 2 months ago.  Otherwise findings are reassuring ?Radiology: ordered. ?   Details: CT head- ? ?Risk ?Prescription drug management. ?Risk Details: Patient with headache, concerning findings with high blood pressure, rule out intracranial bleeding or CVA.  Recent prior hospitalization with TIA versus stroke.  Prescribed blood pressure medicines at baseline.  History of paroxysmal atrial fibrillation.  Anticoagulated with Eliquis.  Care to Dr. Albertina Senegal to consider disposition after CT head imaging. ? ? ? ? ? ? ? ? ? ? ?Final Clinical Impression(s) / ED Diagnoses ?Final diagnoses:  ?Other headache syndrome  ?Secondary hypertension  ? ? ?Rx / DC Orders ?ED Discharge Orders   ? ? None  ? ?  ? ? ?  ?Daleen Bo, MD ?08/01/21 1714 ? ?

## 2021-08-24 ENCOUNTER — Emergency Department (HOSPITAL_COMMUNITY): Payer: Medicare (Managed Care)

## 2021-08-24 ENCOUNTER — Inpatient Hospital Stay (HOSPITAL_COMMUNITY): Payer: Medicare (Managed Care)

## 2021-08-24 ENCOUNTER — Other Ambulatory Visit: Payer: Self-pay

## 2021-08-24 ENCOUNTER — Inpatient Hospital Stay (HOSPITAL_COMMUNITY)
Admission: EM | Admit: 2021-08-24 | Discharge: 2021-08-31 | DRG: 304 | Disposition: A | Discharge status: Home Health | Payer: Medicare (Managed Care) | Attending: Internal Medicine | Admitting: Internal Medicine

## 2021-08-24 DIAGNOSIS — G9340 Encephalopathy, unspecified: Secondary | ICD-10-CM | POA: Diagnosis not present

## 2021-08-24 DIAGNOSIS — Z794 Long term (current) use of insulin: Secondary | ICD-10-CM | POA: Diagnosis not present

## 2021-08-24 DIAGNOSIS — G4733 Obstructive sleep apnea (adult) (pediatric): Secondary | ICD-10-CM | POA: Diagnosis present

## 2021-08-24 DIAGNOSIS — I480 Paroxysmal atrial fibrillation: Secondary | ICD-10-CM | POA: Diagnosis present

## 2021-08-24 DIAGNOSIS — I129 Hypertensive chronic kidney disease with stage 1 through stage 4 chronic kidney disease, or unspecified chronic kidney disease: Secondary | ICD-10-CM | POA: Diagnosis present

## 2021-08-24 DIAGNOSIS — J309 Allergic rhinitis, unspecified: Secondary | ICD-10-CM | POA: Diagnosis present

## 2021-08-24 DIAGNOSIS — G43909 Migraine, unspecified, not intractable, without status migrainosus: Secondary | ICD-10-CM | POA: Diagnosis present

## 2021-08-24 DIAGNOSIS — Z9181 History of falling: Secondary | ICD-10-CM | POA: Diagnosis not present

## 2021-08-24 DIAGNOSIS — E876 Hypokalemia: Secondary | ICD-10-CM | POA: Diagnosis present

## 2021-08-24 DIAGNOSIS — Z79899 Other long term (current) drug therapy: Secondary | ICD-10-CM | POA: Diagnosis not present

## 2021-08-24 DIAGNOSIS — R4182 Altered mental status, unspecified: Secondary | ICD-10-CM | POA: Diagnosis not present

## 2021-08-24 DIAGNOSIS — D631 Anemia in chronic kidney disease: Secondary | ICD-10-CM | POA: Diagnosis present

## 2021-08-24 DIAGNOSIS — E1122 Type 2 diabetes mellitus with diabetic chronic kidney disease: Secondary | ICD-10-CM | POA: Diagnosis present

## 2021-08-24 DIAGNOSIS — Z885 Allergy status to narcotic agent status: Secondary | ICD-10-CM

## 2021-08-24 DIAGNOSIS — Z7901 Long term (current) use of anticoagulants: Secondary | ICD-10-CM | POA: Diagnosis not present

## 2021-08-24 DIAGNOSIS — E1165 Type 2 diabetes mellitus with hyperglycemia: Secondary | ICD-10-CM | POA: Diagnosis present

## 2021-08-24 DIAGNOSIS — H518 Other specified disorders of binocular movement: Secondary | ICD-10-CM

## 2021-08-24 DIAGNOSIS — E669 Obesity, unspecified: Secondary | ICD-10-CM | POA: Diagnosis present

## 2021-08-24 DIAGNOSIS — Z6831 Body mass index (BMI) 31.0-31.9, adult: Secondary | ICD-10-CM

## 2021-08-24 DIAGNOSIS — I160 Hypertensive urgency: Secondary | ICD-10-CM | POA: Diagnosis present

## 2021-08-24 DIAGNOSIS — R778 Other specified abnormalities of plasma proteins: Secondary | ICD-10-CM

## 2021-08-24 DIAGNOSIS — Z8673 Personal history of transient ischemic attack (TIA), and cerebral infarction without residual deficits: Secondary | ICD-10-CM

## 2021-08-24 DIAGNOSIS — I161 Hypertensive emergency: Principal | ICD-10-CM | POA: Diagnosis present

## 2021-08-24 DIAGNOSIS — N179 Acute kidney failure, unspecified: Secondary | ICD-10-CM | POA: Diagnosis present

## 2021-08-24 DIAGNOSIS — I951 Orthostatic hypotension: Secondary | ICD-10-CM | POA: Diagnosis not present

## 2021-08-24 DIAGNOSIS — I674 Hypertensive encephalopathy: Secondary | ICD-10-CM

## 2021-08-24 DIAGNOSIS — J9601 Acute respiratory failure with hypoxia: Secondary | ICD-10-CM | POA: Diagnosis present

## 2021-08-24 DIAGNOSIS — I639 Cerebral infarction, unspecified: Secondary | ICD-10-CM

## 2021-08-24 DIAGNOSIS — E785 Hyperlipidemia, unspecified: Secondary | ICD-10-CM | POA: Diagnosis present

## 2021-08-24 DIAGNOSIS — N1832 Chronic kidney disease, stage 3b: Secondary | ICD-10-CM | POA: Diagnosis present

## 2021-08-24 LAB — URINALYSIS, ROUTINE W REFLEX MICROSCOPIC
Bilirubin Urine: NEGATIVE
Glucose, UA: 150 mg/dL — AB
Ketones, ur: NEGATIVE mg/dL
Leukocytes,Ua: NEGATIVE
Nitrite: NEGATIVE
Protein, ur: 100 mg/dL — AB
Specific Gravity, Urine: 1.01 (ref 1.005–1.030)
pH: 7 (ref 5.0–8.0)

## 2021-08-24 LAB — CBC WITH DIFFERENTIAL/PLATELET
Abs Immature Granulocytes: 0.03 10*3/uL (ref 0.00–0.07)
Basophils Absolute: 0.1 10*3/uL (ref 0.0–0.1)
Basophils Relative: 1 %
Eosinophils Absolute: 0.3 10*3/uL (ref 0.0–0.5)
Eosinophils Relative: 4 %
HCT: 44.7 % (ref 39.0–52.0)
Hemoglobin: 15 g/dL (ref 13.0–17.0)
Immature Granulocytes: 0 %
Lymphocytes Relative: 33 %
Lymphs Abs: 3 10*3/uL (ref 0.7–4.0)
MCH: 25.8 pg — ABNORMAL LOW (ref 26.0–34.0)
MCHC: 33.6 g/dL (ref 30.0–36.0)
MCV: 76.9 fL — ABNORMAL LOW (ref 80.0–100.0)
Monocytes Absolute: 0.7 10*3/uL (ref 0.1–1.0)
Monocytes Relative: 8 %
Neutro Abs: 5.1 10*3/uL (ref 1.7–7.7)
Neutrophils Relative %: 54 %
Platelets: 309 10*3/uL (ref 150–400)
RBC: 5.81 MIL/uL (ref 4.22–5.81)
RDW: 14.2 % (ref 11.5–15.5)
WBC: 9.3 10*3/uL (ref 4.0–10.5)
nRBC: 0 % (ref 0.0–0.2)

## 2021-08-24 LAB — I-STAT CHEM 8, ED
BUN: 23 mg/dL — ABNORMAL HIGH (ref 6–20)
Calcium, Ion: 1.18 mmol/L (ref 1.15–1.40)
Chloride: 105 mmol/L (ref 98–111)
Creatinine, Ser: 3 mg/dL — ABNORMAL HIGH (ref 0.61–1.24)
Glucose, Bld: 187 mg/dL — ABNORMAL HIGH (ref 70–99)
HCT: 44 % (ref 39.0–52.0)
Hemoglobin: 15 g/dL (ref 13.0–17.0)
Potassium: 3.5 mmol/L (ref 3.5–5.1)
Sodium: 140 mmol/L (ref 135–145)
TCO2: 27 mmol/L (ref 22–32)

## 2021-08-24 LAB — COMPREHENSIVE METABOLIC PANEL
ALT: 19 U/L (ref 0–44)
AST: 30 U/L (ref 15–41)
Albumin: 2.5 g/dL — ABNORMAL LOW (ref 3.5–5.0)
Alkaline Phosphatase: 74 U/L (ref 38–126)
Anion gap: 10 (ref 5–15)
BUN: 19 mg/dL (ref 6–20)
CO2: 23 mmol/L (ref 22–32)
Calcium: 9.5 mg/dL (ref 8.9–10.3)
Chloride: 107 mmol/L (ref 98–111)
Creatinine, Ser: 2.84 mg/dL — ABNORMAL HIGH (ref 0.61–1.24)
GFR, Estimated: 28 mL/min — ABNORMAL LOW (ref >60)
Glucose, Bld: 182 mg/dL — ABNORMAL HIGH (ref 70–99)
Potassium: 3.4 mmol/L — ABNORMAL LOW (ref 3.5–5.1)
Sodium: 140 mmol/L (ref 135–145)
Total Bilirubin: 0.8 mg/dL (ref 0.3–1.2)
Total Protein: 6.2 g/dL — ABNORMAL LOW (ref 6.5–8.1)

## 2021-08-24 LAB — I-STAT VENOUS BLOOD GAS, ED
Acid-Base Excess: 5 mmol/L — ABNORMAL HIGH (ref 0.0–2.0)
Bicarbonate: 27.5 mmol/L (ref 20.0–28.0)
Calcium, Ion: 1.18 mmol/L (ref 1.15–1.40)
HCT: 45 % (ref 39.0–52.0)
Hemoglobin: 15.3 g/dL (ref 13.0–17.0)
O2 Saturation: 100 %
Potassium: 3.5 mmol/L (ref 3.5–5.1)
Sodium: 139 mmol/L (ref 135–145)
TCO2: 29 mmol/L (ref 22–32)
pCO2, Ven: 34.1 mmHg — ABNORMAL LOW (ref 44–60)
pH, Ven: 7.515 — ABNORMAL HIGH (ref 7.25–7.43)
pO2, Ven: 189 mmHg — ABNORMAL HIGH (ref 32–45)

## 2021-08-24 LAB — RAPID URINE DRUG SCREEN, HOSP PERFORMED
Amphetamines: NOT DETECTED
Barbiturates: NOT DETECTED
Benzodiazepines: NOT DETECTED
Cocaine: NOT DETECTED
Opiates: NOT DETECTED
Tetrahydrocannabinol: NOT DETECTED

## 2021-08-24 LAB — GLUCOSE, CAPILLARY
Glucose-Capillary: 327 mg/dL — ABNORMAL HIGH (ref 70–99)
Glucose-Capillary: 209 mg/dL — ABNORMAL HIGH (ref 70–99)

## 2021-08-24 LAB — TROPONIN I (HIGH SENSITIVITY)
Troponin I (High Sensitivity): 69 ng/L — ABNORMAL HIGH (ref <18)
Troponin I (High Sensitivity): 42 ng/L — ABNORMAL HIGH (ref <18)

## 2021-08-24 LAB — CBG MONITORING, ED: Glucose-Capillary: 185 mg/dL — ABNORMAL HIGH (ref 70–99)

## 2021-08-24 LAB — ETHANOL: Alcohol, Ethyl (B): <10 mg/dL (ref <10)

## 2021-08-24 LAB — MRSA NEXT GEN BY PCR, NASAL: MRSA by PCR Next Gen: NOT DETECTED

## 2021-08-24 IMAGING — MR MR HEAD W/O CM
17 of 18 series · 42 of 48 positions shown · non-contrast
Comparison: Prior head CT from [DATE] and MRI from [DATE].

CLINICAL DATA: Initial evaluation for delirium.

EXAM:
MRI HEAD WITHOUT CONTRAST
MRA HEAD WITHOUT CONTRAST
TECHNIQUE: Multiplanar, multi-echo pulse sequences of the brain and surrounding
structures were acquired without intravenous contrast. Angiographic
images of the Circle of Willis were acquired using MRA technique
without intravenous contrast.

[Series 5: DWI · axial · 3.0mm · 0.92mm/px · z∈[-69,+91]mm · 5 of 110 slices shown (1 of 4)]
[im 1/110]
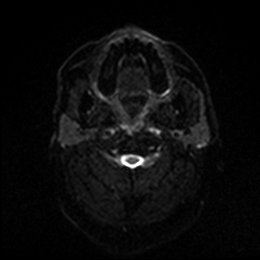
[im 28/110]
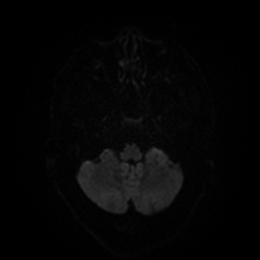
[im 55/110]
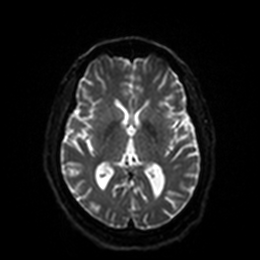
[im 82/110]
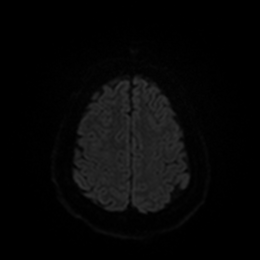
[im 110/110]
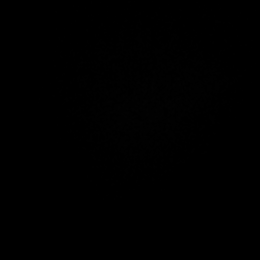

[Series 6: DWI · axial · 3.0mm · 0.92mm/px · z∈[-69,+89]mm · 2 of 54 slices shown (2 of 4)]
[im 1/54]
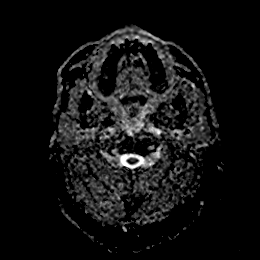
[im 54/54]
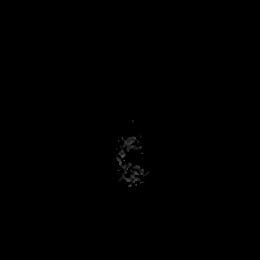

[Series 7: DWI · coronal · 4.0mm · 0.88mm/px · 4 of 82 slices shown (3 of 4)]
[im 1/82]
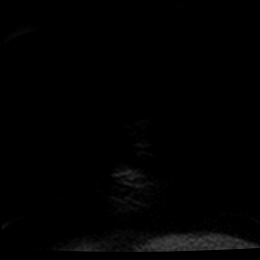
[im 28/82]
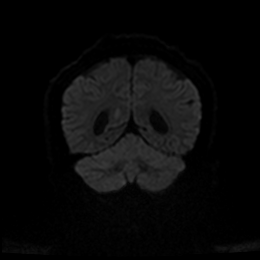
[im 55/82]
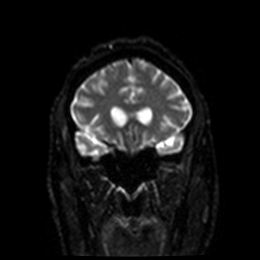
[im 82/82]
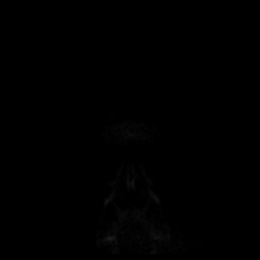

[Series 8: DWI · coronal · 4.0mm · 0.88mm/px · 2 of 41 slices shown (4 of 4)]
[im 1/41]
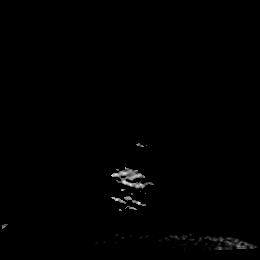
[im 41/41]
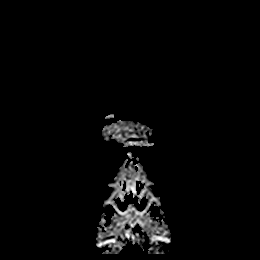

[Series 9: T1 · sagittal · 5.0mm · 0.78mm/px · 1 of 28 slices shown (1 of 2)]
[im 1/28]
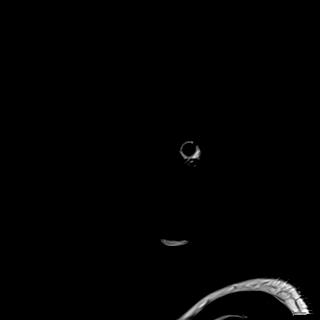

[Series 10: T2 · axial · 5.0mm · 0.75mm/px · 1 of 29 slices shown (1 of 3)]
[im 1/29]
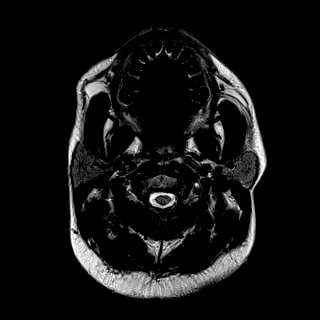

[Series 11: FLAIR · axial · 5.0mm · 0.47mm/px · 1 of 29 slices shown (1 of 2)]
[im 1/29]
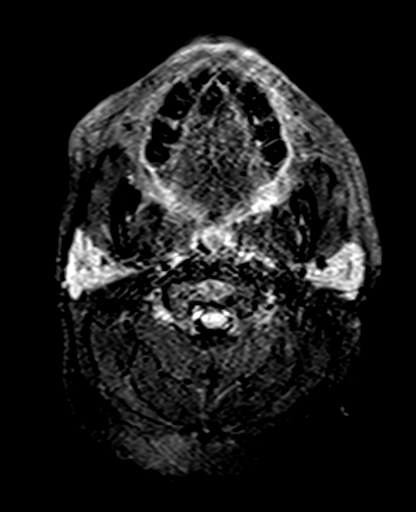

[Series 12: mag_images · axial · 3.0mm · 0.94mm/px · z∈[-77,+99]mm · 3 of 60 slices shown]
[im 1/60]
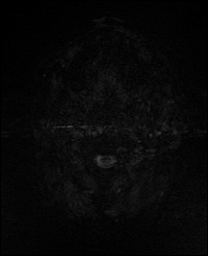
[im 30/60]
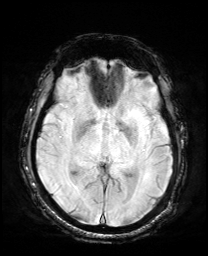
[im 60/60]
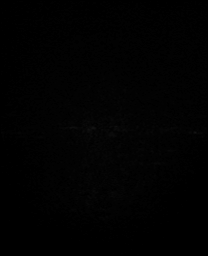

[Series 13: pha_images · axial · 3.0mm · 0.94mm/px · z∈[-77,+90]mm · 2 of 55 slices shown]
[im 1/55]
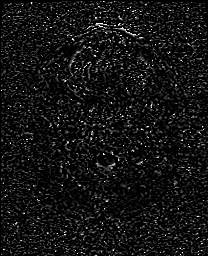
[im 55/55]
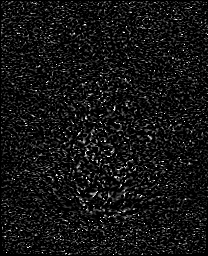

[Series 14: swi_images · axial · 3.0mm · 0.94mm/px · z∈[-77,+99]mm · 3 of 60 slices shown]
[im 1/60]
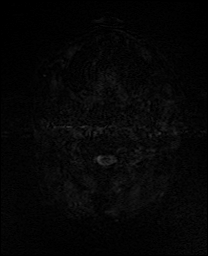
[im 30/60]
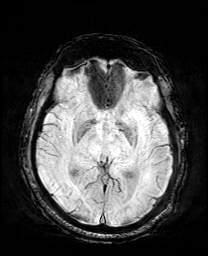
[im 60/60]
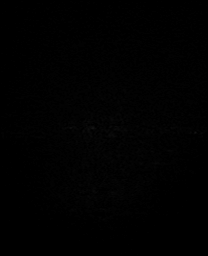

[Series 15: mip_images(sw) · axial · 24.0mm · 0.94mm/px · z∈[-67,+88]mm · 2 of 53 slices shown]
[im 1/53]
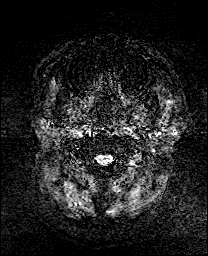
[im 53/53]
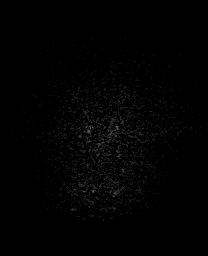

[Series 17: T2 · coronal · 5.0mm · 0.72mm/px · 2 of 35 slices shown (2 of 3)]
[im 1/35]
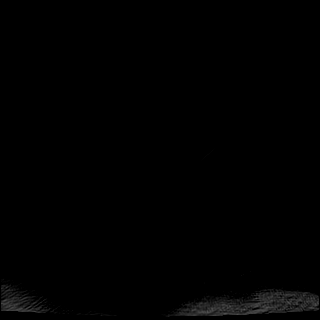
[im 35/35]
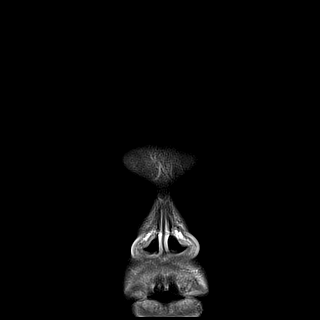

[Series 18: t1_mprage_tra_p2_iso · axial · 1.0mm · 0.98mm/px · z∈[-66,+91]mm · 7 of 160 slices shown]
[im 1/160]
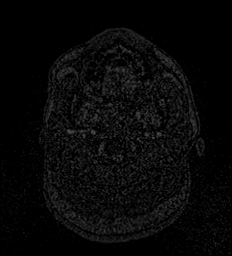
[im 27/160]
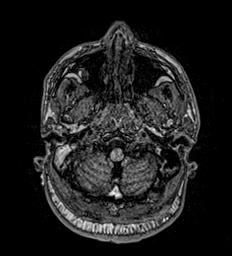
[im 54/160]
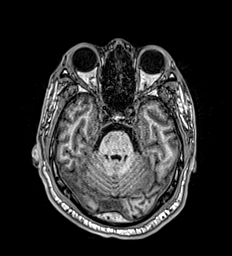
[im 80/160]
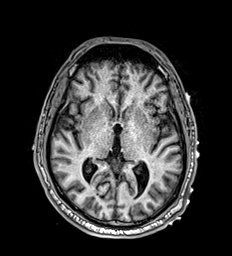
[im 107/160]
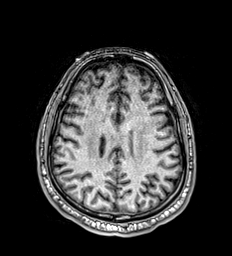
[im 133/160]
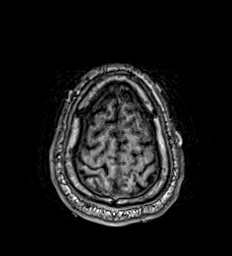
[im 160/160]
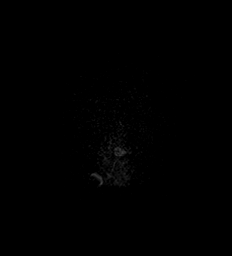

[Series 19: t1_mprage_tra_p2_iso_mpr_coronal · coronal · 1.0mm · 0.45mm/px · 2 of 129 slices shown]
[im 1/129]
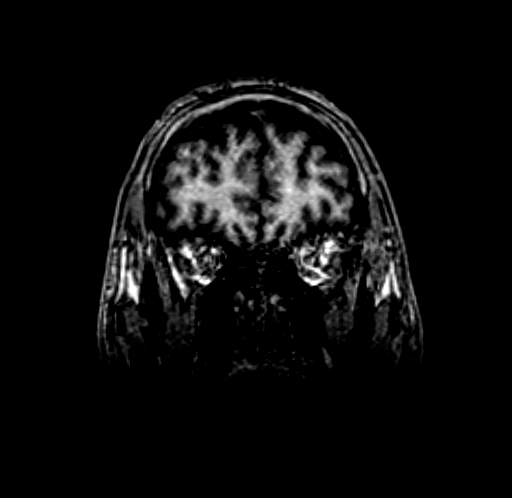
[im 26/129]
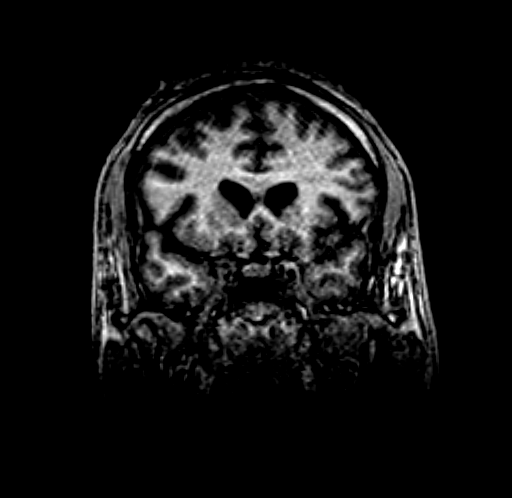

[Series 20: T2 · coronal · 3.0mm · 0.28mm/px · 2 of 41 slices shown (3 of 3)]
[im 1/41]
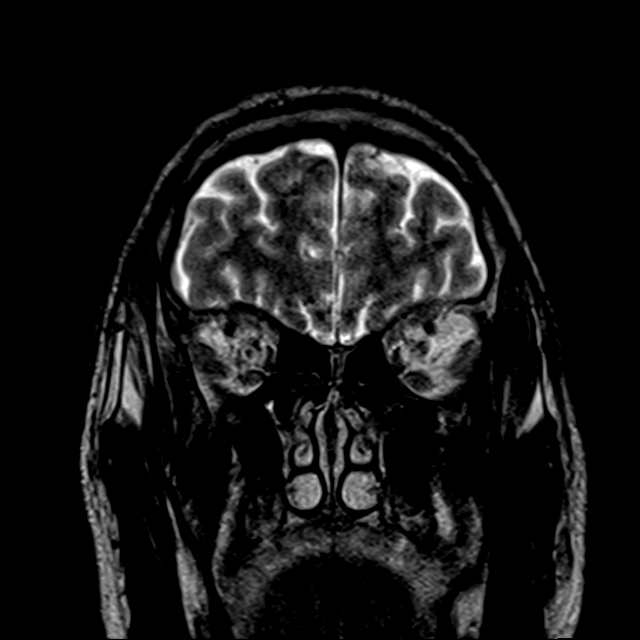
[im 41/41]
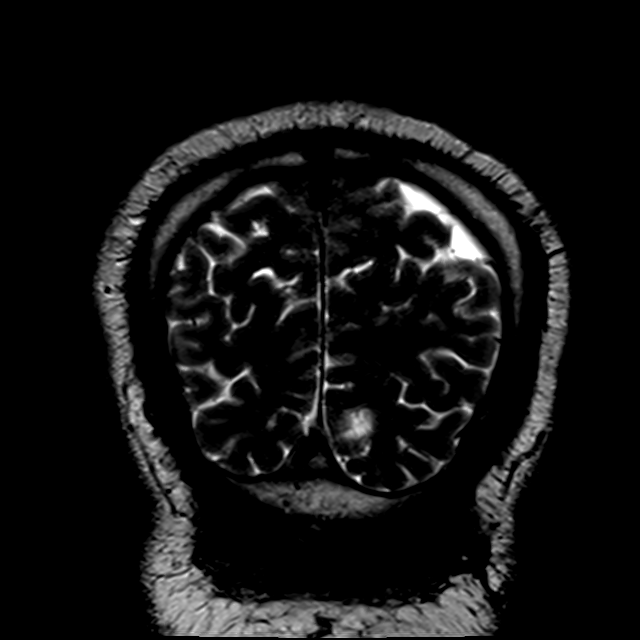

[Series 21: FLAIR · coronal · 3.0mm · 0.59mm/px · 2 of 40 slices shown (2 of 2)]
[im 1/40]
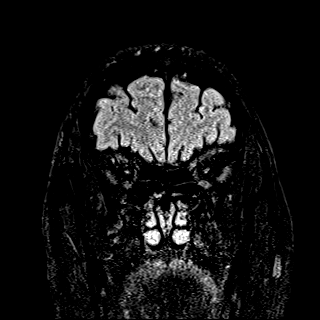
[im 40/40]
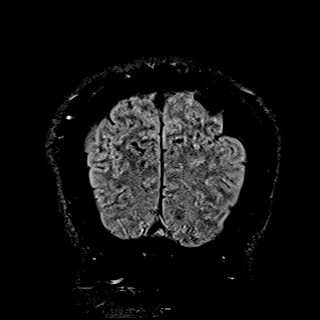

[Series 22: T1 · sagittal · 5.0mm · 0.94mm/px · 1 of 25 slices shown (2 of 2)]
[im 1/25]
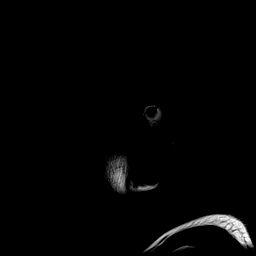

[42 of 48 positions shown; findings below may reference images not displayed]

FINDINGS: MRI HEAD FINDINGS

Brain: Examination degraded by motion artifact.

Cerebral volume within normal limits. Mild scattered T2/FLAIR signal
abnormality involving the supra [REDACTED] cerebral white matter, grossly
stable from prior, mild in nature.

11 mm focus of mild diffusion abnormality involving the right
paramedian splenium (series 5, image 85). Associated T2/FLAIR
hyperintensity without ADC correlate. This likely reflects an
evolving subacute ischemic infarct. No significant associated blood
products or mass effect.

No other evidence for acute or subacute ischemia. No parenchymal
changes of acute seizure. No mass lesion, midline shift or mass
effect. No hydrocephalus or extra-axial fluid collection. Pituitary
gland suprasellar region within normal limits. Midline structures
intact and normally formed. No intrinsic temporal lobe abnormality.

Vascular: Major intracranial vascular flow voids are maintained.

Skull and upper cervical spine: Craniocervical junction within
normal limits. Diffusely decreased T1 signal intensity seen
throughout the bone marrow of the upper cervical spine, nonspecific,
but most commonly related to anemia, smoking, or obesity. No focal
marrow replacing lesion. No scalp soft tissue abnormality.

Sinuses/Orbits: Globes orbital soft tissues demonstrate no acute
finding. Few scattered retention cyst noted within the paranasal
sinuses. Trace bilateral mastoid effusions, of doubtful
significance.

Other: None.

MRA HEAD FINDINGS

Anterior circulation: Examination degraded by motion artifact.

Visualized distal cervical segments of the internal carotid arteries
are patent with antegrade flow. Petrous, cavernous, and supraclinoid
segments remain widely patent without significant stenosis. A1
segments, anterior knee artery complex common anterior cerebral
arteries patent without stenosis. Normal in stenosis or occlusion.
Normal MCA bifurcations. No proximal MCA branch occlusion. Distal
MCA branches well perfused and symmetric.

Posterior circulation: Both vertebral arteries widely patent to the
vertebrobasilar junction. Partially visualized PICA patent
bilaterally. Basilar patent to its distal aspect without stenosis.
Superior cerebellar and posterior cerebral arteries patent
bilaterally. Prominent left posterior communicating artery noted.

Anatomic variants: None significant.  No intracranial aneurysm.
IMPRESSION: MRI HEAD IMPRESSION:

1. Motion degraded exam.
2. 11 mm focus of mild diffusion abnormality involving the right
paramedian splenium, most consistent with an evolving subacute
ischemic infarct. No associated hemorrhage or mass effect.
3. Mild cerebral white matter disease, nonspecific, but most
commonly related to chronic microvascular ischemic disease, stable.

MRA HEAD IMPRESSION:

Negative intracranial MRA. No large vessel occlusion,
hemodynamically significant stenosis, or other acute vascular
abnormality.

## 2021-08-24 IMAGING — MR MR MRA HEAD W/O CM
1 series · 19 of 48 positions shown · non-contrast
Comparison: Prior head CT from [DATE] and MRI from [DATE].

CLINICAL DATA: Initial evaluation for delirium.

EXAM:
MRI HEAD WITHOUT CONTRAST
MRA HEAD WITHOUT CONTRAST
TECHNIQUE: Multiplanar, multi-echo pulse sequences of the brain and surrounding
structures were acquired without intravenous contrast. Angiographic
images of the Circle of Willis were acquired using MRA technique
without intravenous contrast.

[Series 5: 3d cow · axial · 0.5mm · 0.41mm/px · z∈[-64,+25]mm · 19 of 188 slices shown]
[im 1/188]
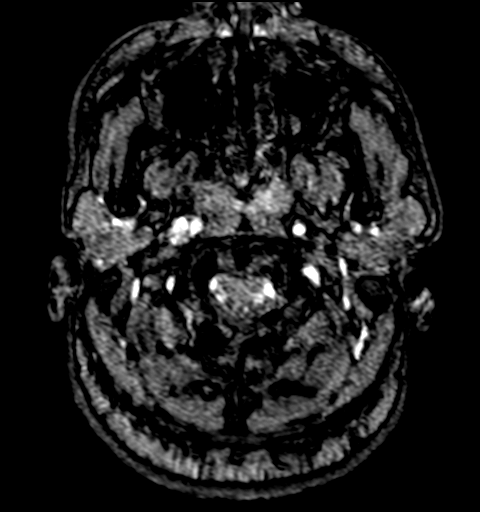
[im 4/188]
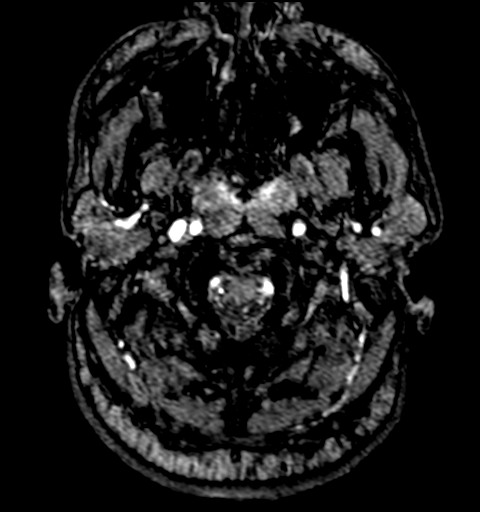
[im 8/188]
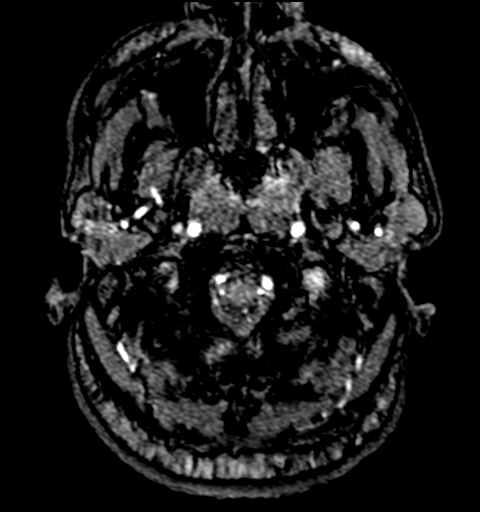
[im 12/188]
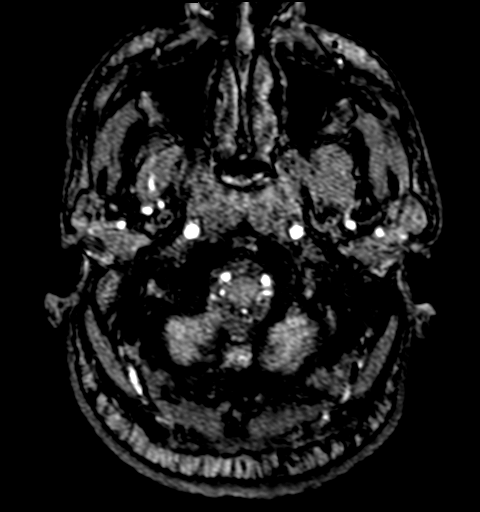
[im 16/188]
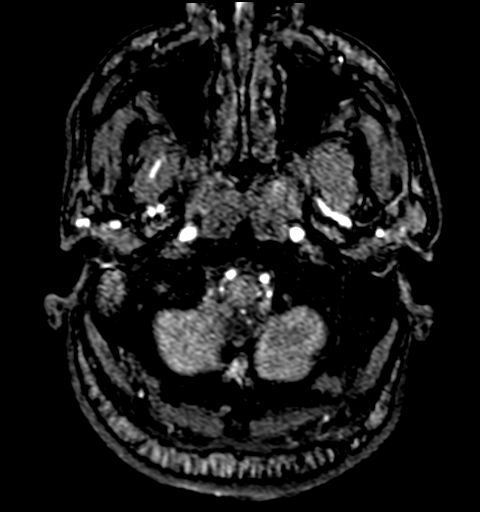
[im 20/188]
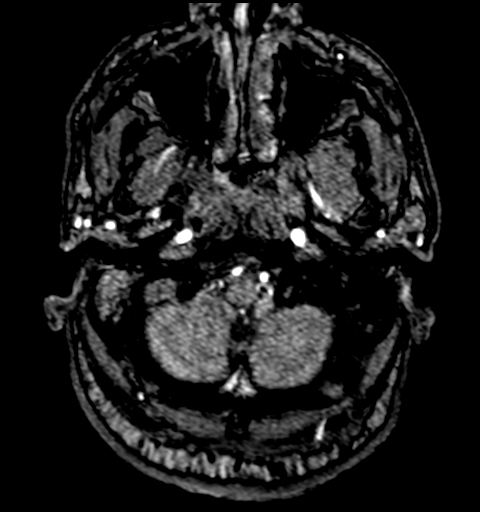
[im 24/188]
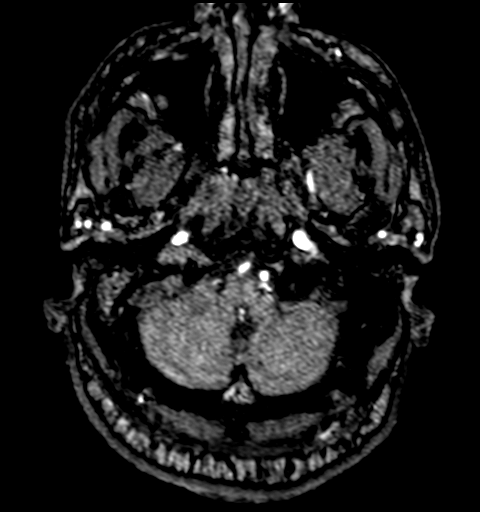
[im 28/188]
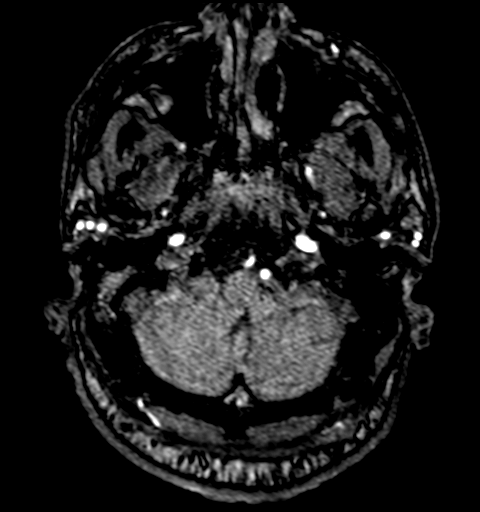
[im 32/188]
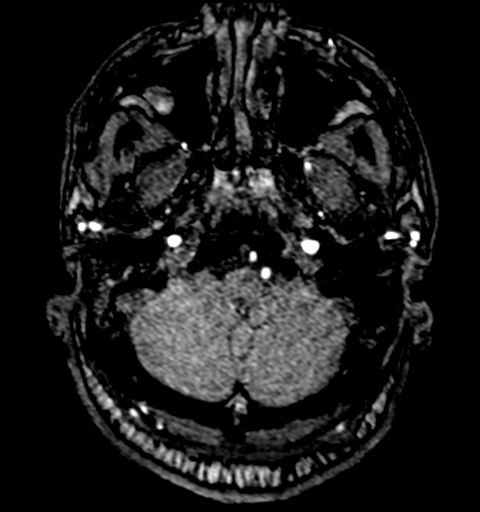
[im 36/188]
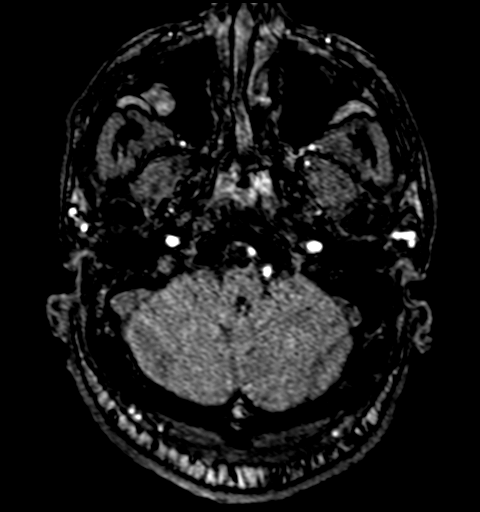
[im 40/188]
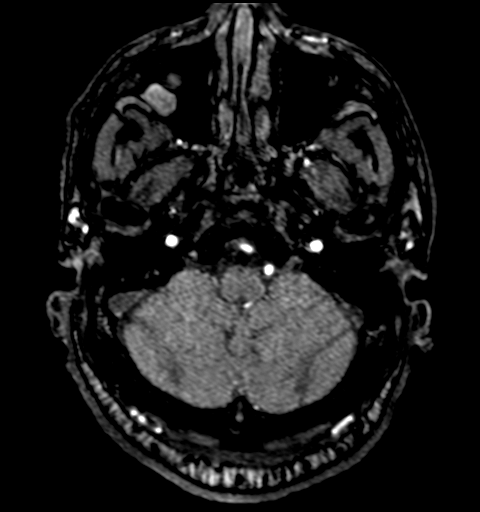
[im 60/188]
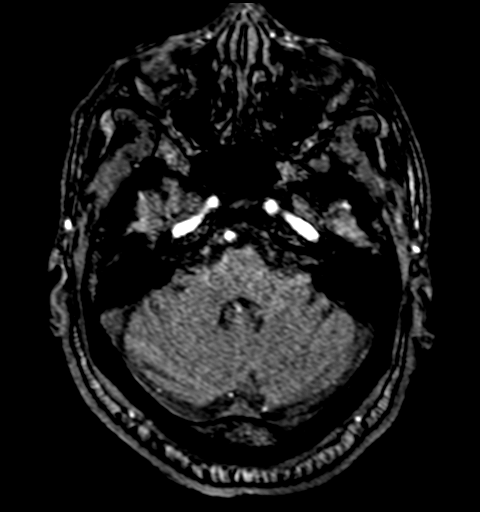
[im 84/188]
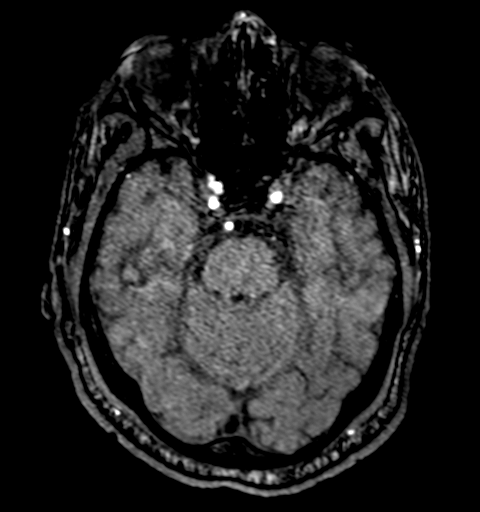
[im 96/188]
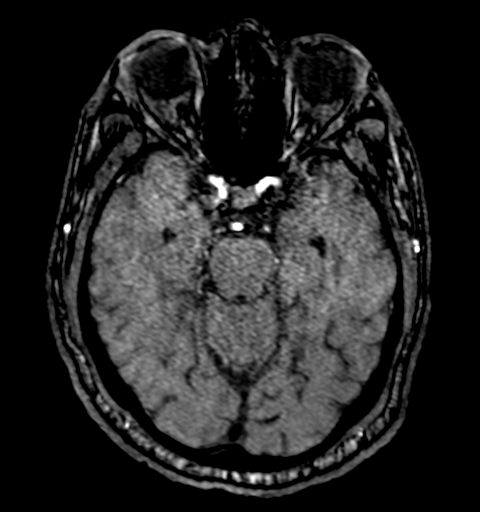
[im 108/188]
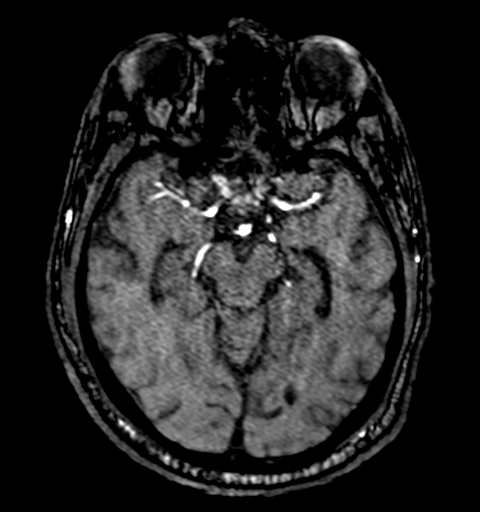
[im 132/188]
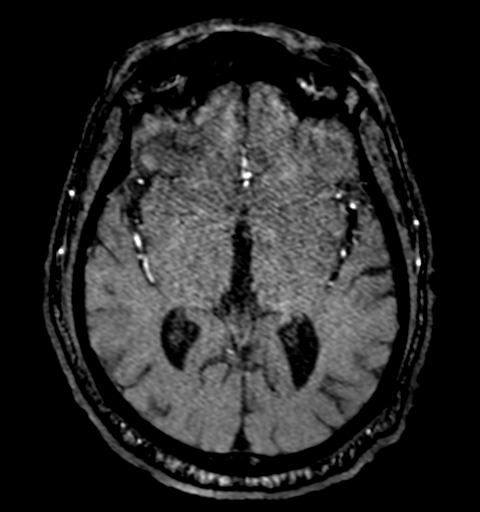
[im 156/188]
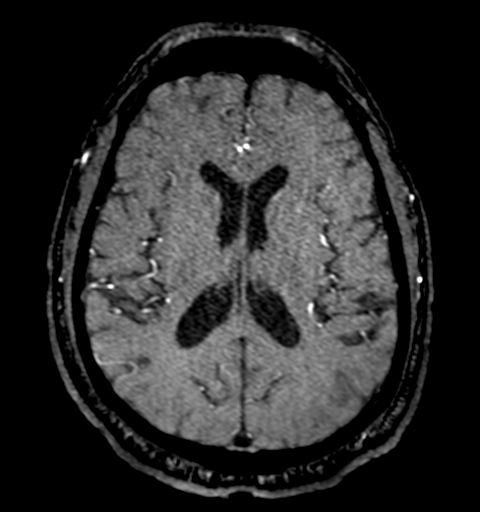
[im 160/188]
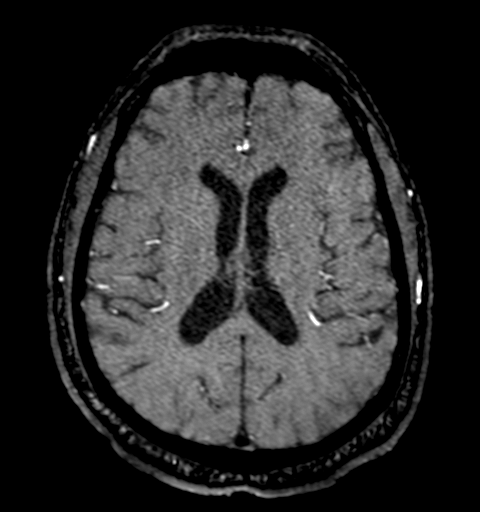
[im 180/188]
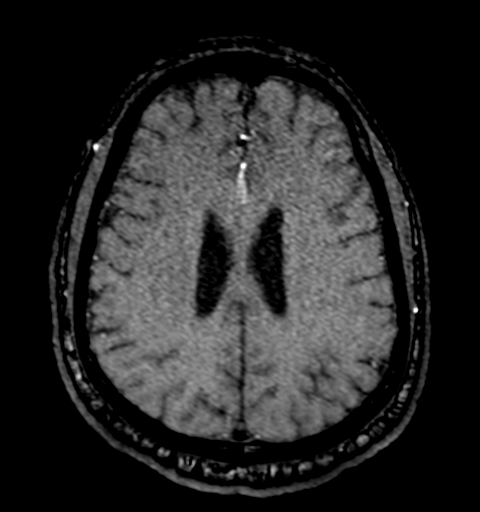

[19 of 48 positions shown; findings below may reference images not displayed]

FINDINGS: MRI HEAD FINDINGS

Brain: Examination degraded by motion artifact.

Cerebral volume within normal limits. Mild scattered T2/FLAIR signal
abnormality involving the supra [REDACTED] cerebral white matter, grossly
stable from prior, mild in nature.

11 mm focus of mild diffusion abnormality involving the right
paramedian splenium (series 5, image 85). Associated T2/FLAIR
hyperintensity without ADC correlate. This likely reflects an
evolving subacute ischemic infarct. No significant associated blood
products or mass effect.

No other evidence for acute or subacute ischemia. No parenchymal
changes of acute seizure. No mass lesion, midline shift or mass
effect. No hydrocephalus or extra-axial fluid collection. Pituitary
gland suprasellar region within normal limits. Midline structures
intact and normally formed. No intrinsic temporal lobe abnormality.

Vascular: Major intracranial vascular flow voids are maintained.

Skull and upper cervical spine: Craniocervical junction within
normal limits. Diffusely decreased T1 signal intensity seen
throughout the bone marrow of the upper cervical spine, nonspecific,
but most commonly related to anemia, smoking, or obesity. No focal
marrow replacing lesion. No scalp soft tissue abnormality.

Sinuses/Orbits: Globes orbital soft tissues demonstrate no acute
finding. Few scattered retention cyst noted within the paranasal
sinuses. Trace bilateral mastoid effusions, of doubtful
significance.

Other: None.

MRA HEAD FINDINGS

Anterior circulation: Examination degraded by motion artifact.

Visualized distal cervical segments of the internal carotid arteries
are patent with antegrade flow. Petrous, cavernous, and supraclinoid
segments remain widely patent without significant stenosis. A1
segments, anterior knee artery complex common anterior cerebral
arteries patent without stenosis. Normal in stenosis or occlusion.
Normal MCA bifurcations. No proximal MCA branch occlusion. Distal
MCA branches well perfused and symmetric.

Posterior circulation: Both vertebral arteries widely patent to the
vertebrobasilar junction. Partially visualized PICA patent
bilaterally. Basilar patent to its distal aspect without stenosis.
Superior cerebellar and posterior cerebral arteries patent
bilaterally. Prominent left posterior communicating artery noted.

Anatomic variants: None significant.  No intracranial aneurysm.
IMPRESSION: MRI HEAD IMPRESSION:

1. Motion degraded exam.
2. 11 mm focus of mild diffusion abnormality involving the right
paramedian splenium, most consistent with an evolving subacute
ischemic infarct. No associated hemorrhage or mass effect.
3. Mild cerebral white matter disease, nonspecific, but most
commonly related to chronic microvascular ischemic disease, stable.

MRA HEAD IMPRESSION:

Negative intracranial MRA. No large vessel occlusion,
hemodynamically significant stenosis, or other acute vascular
abnormality.

## 2021-08-24 IMAGING — DX DG CHEST 1V PORT
1 series · 1 of 1 positions shown · non-contrast
Comparison: [DATE]

CLINICAL DATA: Chest pain.

EXAM:
PORTABLE CHEST 1 VIEW

[chest]
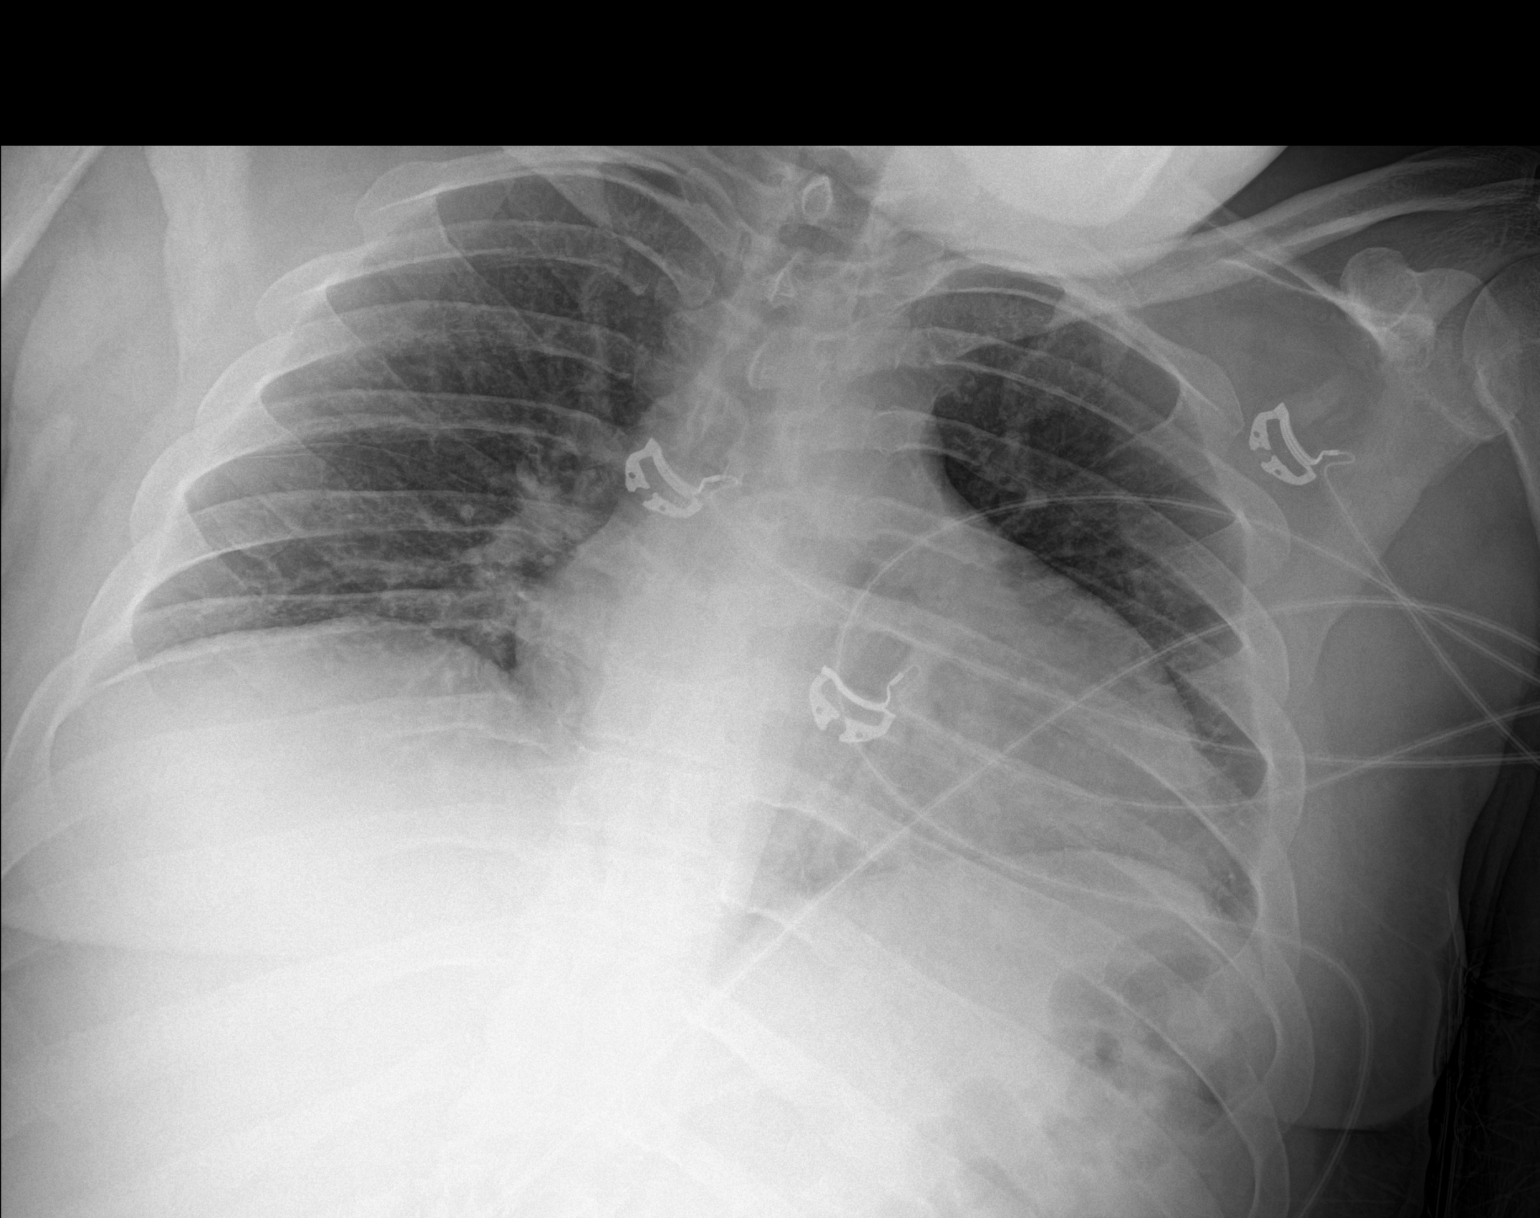

[1 of 1 positions shown; findings below may reference images not displayed]

FINDINGS: The cardiac silhouette, mediastinal and hilar contours are within
normal limits given the AP projection and portable technique. Low
lung volumes with vascular crowding and streaky atelectasis but no
pulmonary infiltrates or pleural effusions.
IMPRESSION: Low lung volumes with vascular crowding and streaky atelectasis.

## 2021-08-24 IMAGING — CT CT HEAD W/O CM
4 of 5 series · 13 of 47 positions shown, 15 images · non-contrast
Comparison: CT head [DATE]

CLINICAL DATA: Altered mental status



[Series 3: head wo · axial · 0.43mm/px · z∈[+32,+62]mm · 2 of 32 slices shown]
[im 7/32  brain]
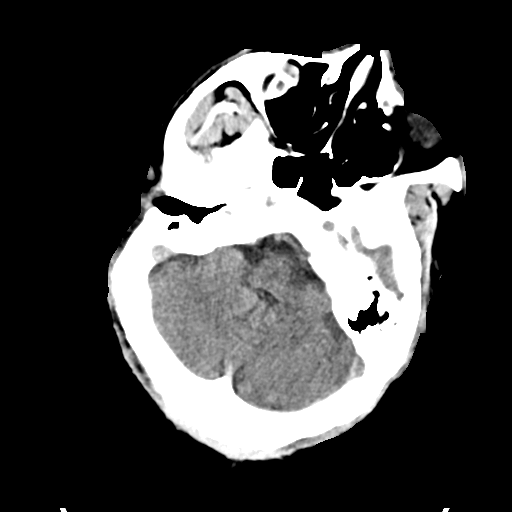
[im 13/32  brain]
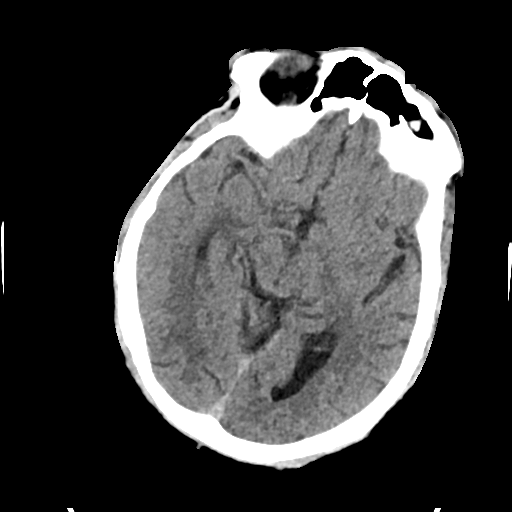

[Series 5: sag soft · sagittal · 0.31mm/px · 3 of 62 slices shown]
[im 21/62  brain]
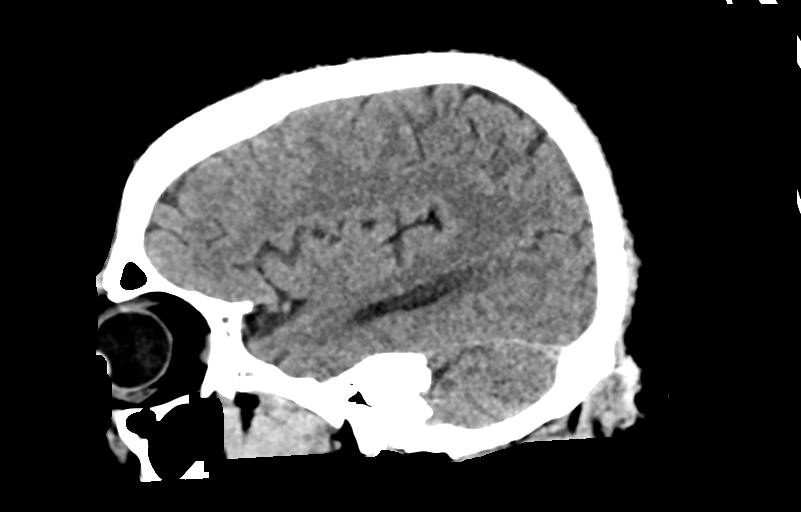
[im 31/62  brain]
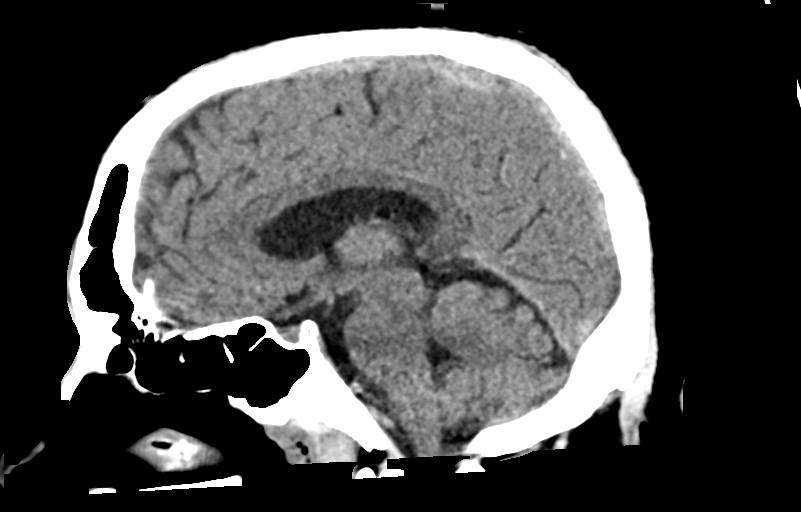
[im 41/62  brain]
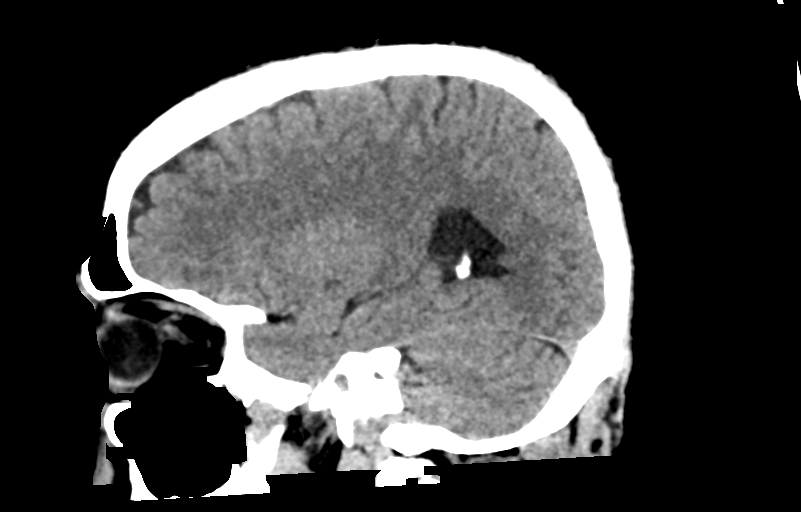

[Series 6: cor soft · coronal · 0.31mm/px · 3 of 84 slices shown]
[im 28/84  brain]
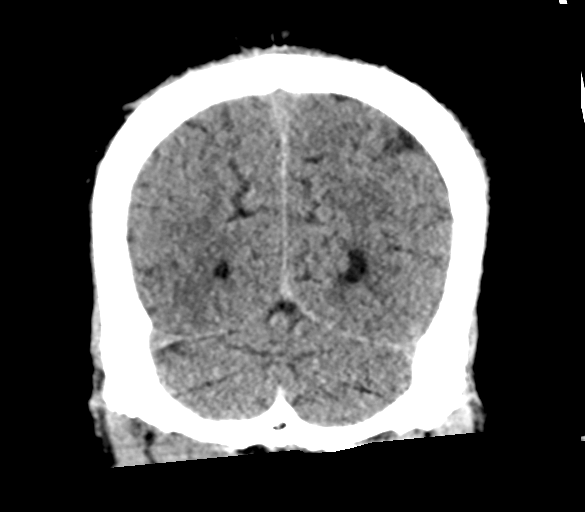
[im 37/84  brain]
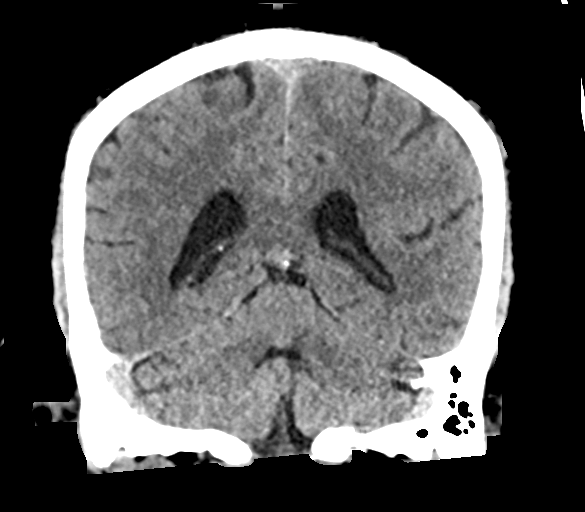
[im 47/84  brain]
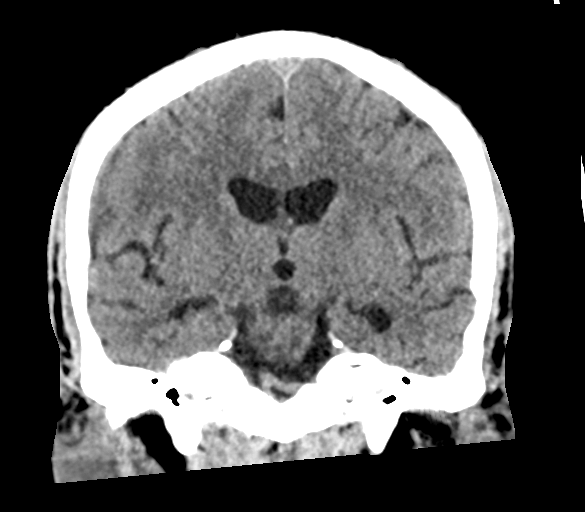

[Series 7: true axial · axial · 0.36mm/px · z∈[+38,+142]mm · 5 of 33 slices shown, 7 images]
[im 6/33  brain]
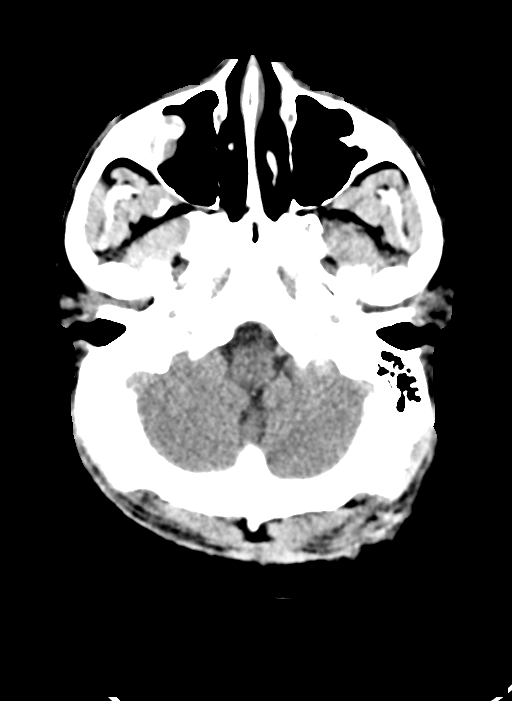
[im 6/33  bone]
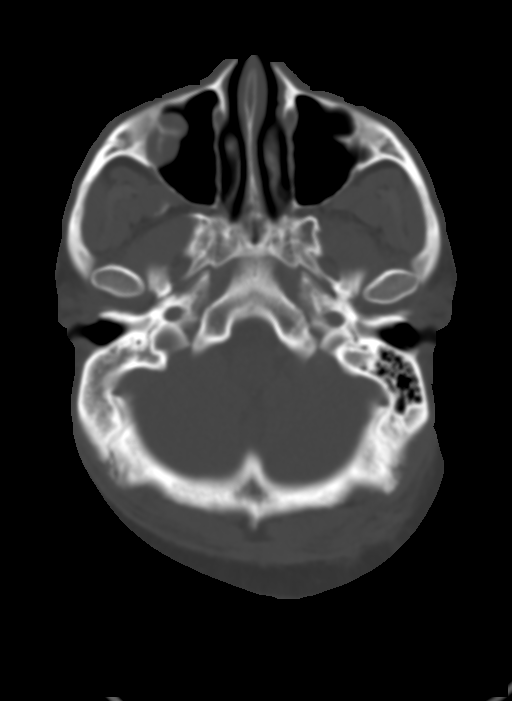
[im 11/33  brain]
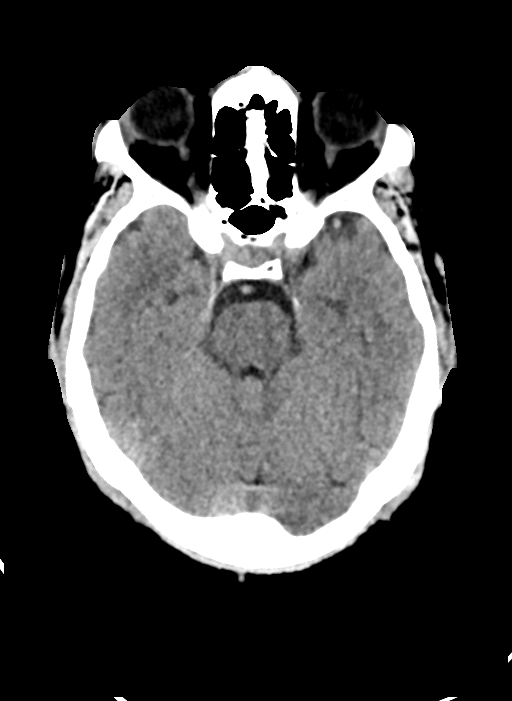
[im 17/33  brain]
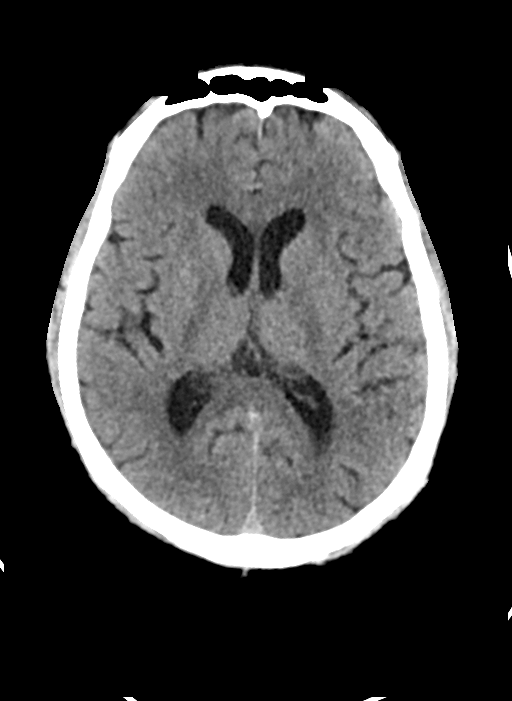
[im 22/33  brain]
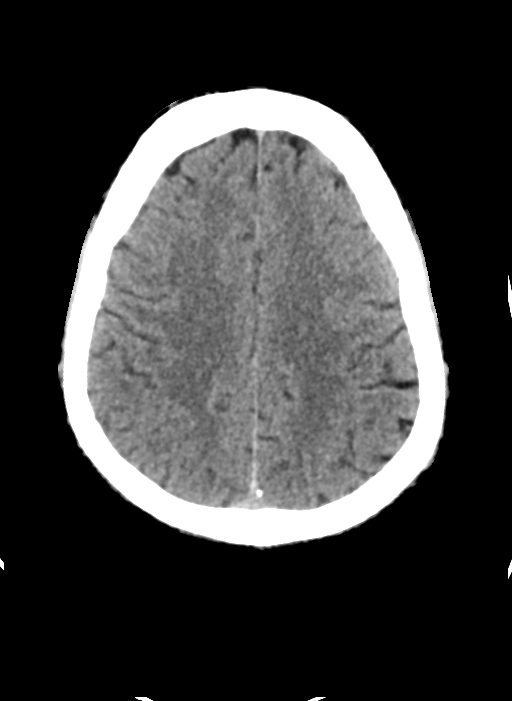
[im 27/33  brain]
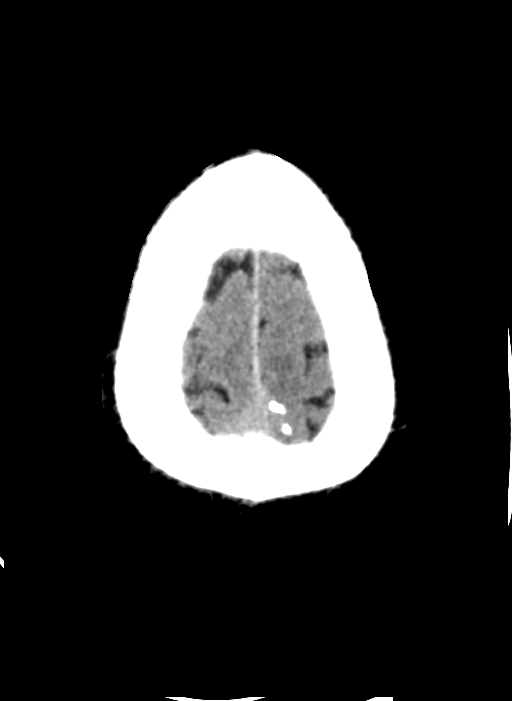
[im 27/33  bone]
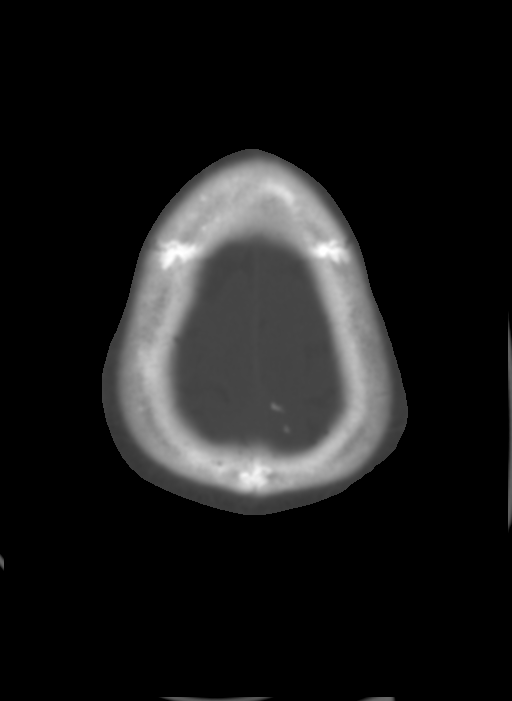

[13 of 47 positions shown; findings below may reference images not displayed]

FINDINGS: Brain: There is no acute intracranial hemorrhage, extra-axial fluid
collection, or acute infarct.

Parenchymal volume is normal. The ventricles are normal in size.
Gray-white differentiation is preserved.

There is no mass lesion.  There is no mass effect or midline shift.

Vascular: No hyperdense vessel or unexpected calcification.

Skull: Normal. Negative for fracture or focal lesion.

Sinuses/Orbits: There are retention cysts in the right frontal and
maxillary sinuses. The globes and orbits are unremarkable.

Other: None.
IMPRESSION: No acute intracranial pathology.

## 2021-08-24 MED ORDER — LORAZEPAM 2 MG/ML IJ SOLN
2.0000 mg | Freq: Once | INTRAMUSCULAR | Status: AC
  Administered 2021-08-24: 2 mg via INTRAVENOUS

## 2021-08-24 MED ORDER — DOCUSATE SODIUM 100 MG PO CAPS: 100.0000 mg | ORAL_CAPSULE | Freq: Two times a day (BID) | ORAL | Status: DC | PRN

## 2021-08-24 MED ORDER — INSULIN ASPART 100 UNIT/ML IJ SOLN
0.0000 [IU] | INTRAMUSCULAR | Status: DC
  Administered 2021-08-24: 5 [IU] via SUBCUTANEOUS
  Administered 2021-08-24: 11 [IU] via SUBCUTANEOUS
  Administered 2021-08-25: 2 [IU] via SUBCUTANEOUS
  Administered 2021-08-25 – 2021-08-26 (×3): 3 [IU] via SUBCUTANEOUS
  Administered 2021-08-26 (×4): 2 [IU] via SUBCUTANEOUS
  Administered 2021-08-27: 3 [IU] via SUBCUTANEOUS

## 2021-08-24 MED ORDER — LORAZEPAM 2 MG/ML IJ SOLN
INTRAMUSCULAR | Status: AC
  Filled 2021-08-24: qty 1

## 2021-08-24 MED ORDER — STERILE WATER FOR INJECTION IJ SOLN
INTRAMUSCULAR | Status: AC
  Administered 2021-08-24: 10 mL
  Filled 2021-08-24: qty 10

## 2021-08-24 MED ORDER — LORAZEPAM 2 MG/ML IJ SOLN
2.0000 mg | Freq: Four times a day (QID) | INTRAMUSCULAR | Status: DC | PRN
  Administered 2021-08-25: 2 mg via INTRAVENOUS
  Filled 2021-08-24 (×2): qty 1

## 2021-08-24 MED ORDER — LORAZEPAM 2 MG/ML IJ SOLN
1.0000 mg | Freq: Four times a day (QID) | INTRAMUSCULAR | Status: DC | PRN
  Administered 2021-08-24: 1 mg via INTRAVENOUS
  Filled 2021-08-24: qty 1

## 2021-08-24 MED ORDER — ZIPRASIDONE MESYLATE 20 MG IM SOLR
INTRAMUSCULAR | Status: AC
  Administered 2021-08-24: 20 mg via INTRAMUSCULAR
  Filled 2021-08-24: qty 20

## 2021-08-24 MED ORDER — CLEVIDIPINE BUTYRATE 0.5 MG/ML IV EMUL
0.0000 mg/h | INTRAVENOUS | Status: DC
  Administered 2021-08-24: 1 mg/h via INTRAVENOUS
  Administered 2021-08-24: 3 mg/h via INTRAVENOUS
  Administered 2021-08-25: 19 mg/h via INTRAVENOUS
  Administered 2021-08-25 (×2): 11 mg/h via INTRAVENOUS
  Administered 2021-08-25: 20 mg/h via INTRAVENOUS
  Administered 2021-08-25: 7 mg/h via INTRAVENOUS
  Administered 2021-08-25: 3 mg/h via INTRAVENOUS
  Administered 2021-08-25: 12 mg/h via INTRAVENOUS
  Administered 2021-08-25: 15 mg/h via INTRAVENOUS
  Administered 2021-08-26: 11 mg/h via INTRAVENOUS
  Filled 2021-08-24 (×2): qty 50
  Filled 2021-08-24: qty 100
  Filled 2021-08-24 (×4): qty 50
  Filled 2021-08-24: qty 100
  Filled 2021-08-24: qty 50
  Filled 2021-08-24: qty 100
  Filled 2021-08-24: qty 50
  Filled 2021-08-24: qty 100
  Filled 2021-08-24: qty 50
  Filled 2021-08-24: qty 100

## 2021-08-24 MED ORDER — ZIPRASIDONE MESYLATE 20 MG IM SOLR
INTRAMUSCULAR | Status: AC
  Filled 2021-08-24: qty 20

## 2021-08-24 MED ORDER — ZIPRASIDONE MESYLATE 20 MG IM SOLR: 20.0000 mg | Freq: Once | INTRAMUSCULAR | Status: AC

## 2021-08-24 MED ORDER — HYDRALAZINE HCL 20 MG/ML IJ SOLN
20.0000 mg | Freq: Once | INTRAMUSCULAR | Status: DC
  Administered 2021-08-24: 20 mg via INTRAVENOUS
  Filled 2021-08-24: qty 1

## 2021-08-24 MED ORDER — LABETALOL HCL 5 MG/ML IV SOLN
10.0000 mg | Freq: Once | INTRAVENOUS | Status: AC
  Administered 2021-08-24: 10 mg via INTRAVENOUS

## 2021-08-24 MED ORDER — POLYETHYLENE GLYCOL 3350 17 G PO PACK: 17.0000 g | PACK | Freq: Every day | ORAL | Status: DC | PRN

## 2021-08-24 MED ORDER — LORAZEPAM 2 MG/ML IJ SOLN
INTRAMUSCULAR | Status: AC
Start: 2021-08-24 — End: 2021-08-24
  Administered 2021-08-24: 2 mg via INTRAVENOUS
  Filled 2021-08-24: qty 1

## 2021-08-24 MED ORDER — ZIPRASIDONE MESYLATE 20 MG IM SOLR
20.0000 mg | Freq: Once | INTRAMUSCULAR | Status: AC
  Administered 2021-08-24: 20 mg via INTRAMUSCULAR
  Filled 2021-08-24: qty 20

## 2021-08-24 MED ORDER — LORAZEPAM 2 MG/ML IJ SOLN
1.0000 mg | Freq: Once | INTRAMUSCULAR | Status: AC
  Administered 2021-08-24: 1 mg via INTRAVENOUS
  Filled 2021-08-24: qty 1

## 2021-08-24 MED ORDER — POTASSIUM CHLORIDE 10 MEQ/100ML IV SOLN
10.0000 meq | INTRAVENOUS | Status: AC
  Administered 2021-08-24 (×4): 10 meq via INTRAVENOUS
  Filled 2021-08-24 (×4): qty 100

## 2021-08-24 MED ORDER — METOPROLOL TARTRATE 5 MG/5ML IV SOLN
2.5000 mg | INTRAVENOUS | Status: DC | PRN
  Administered 2021-08-25 (×3): 5 mg via INTRAVENOUS
  Filled 2021-08-24 (×3): qty 5

## 2021-08-24 MED ORDER — DROPERIDOL 2.5 MG/ML IJ SOLN
1.2500 mg | Freq: Once | INTRAMUSCULAR | Status: DC
  Filled 2021-08-24: qty 2

## 2021-08-24 MED ORDER — HEPARIN (PORCINE) 25000 UT/250ML-% IV SOLN
2100.0000 [IU]/h | INTRAVENOUS | Status: DC
  Administered 2021-08-24 – 2021-08-25 (×2): 1500 [IU]/h via INTRAVENOUS
  Administered 2021-08-25: 2100 [IU]/h via INTRAVENOUS
  Filled 2021-08-24 (×4): qty 250

## 2021-08-24 MED ORDER — LABETALOL HCL 5 MG/ML IV SOLN
10.0000 mg | Freq: Once | INTRAVENOUS | Status: AC
  Administered 2021-08-24: 10 mg via INTRAVENOUS
  Filled 2021-08-24: qty 4

## 2021-08-24 MED ORDER — STERILE WATER FOR INJECTION IJ SOLN
INTRAMUSCULAR | Status: AC
  Filled 2021-08-24: qty 10

## 2021-08-24 MED ORDER — HYDRALAZINE HCL 20 MG/ML IJ SOLN
10.0000 mg | INTRAMUSCULAR | Status: DC | PRN
  Administered 2021-08-24 – 2021-08-25 (×3): 20 mg via INTRAVENOUS
  Administered 2021-08-27: 40 mg via INTRAVENOUS
  Administered 2021-08-29: 20 mg via INTRAVENOUS
  Filled 2021-08-24 (×3): qty 1
  Filled 2021-08-24: qty 2
  Filled 2021-08-24: qty 1

## 2021-08-24 MED ORDER — LORAZEPAM 2 MG/ML IJ SOLN
2.0000 mg | Freq: Once | INTRAMUSCULAR | Status: AC
Start: 2021-08-24 — End: 2021-08-24

## 2021-08-24 MED ORDER — LORAZEPAM 2 MG/ML IJ SOLN
1.0000 mg | Freq: Once | INTRAMUSCULAR | Status: AC
  Administered 2021-08-24: 1 mg via INTRAVENOUS

## 2021-08-24 NOTE — ED Triage Notes (Signed)
Pt brought into the ED via [facility] from work with Altered Mental Status. Per EMS pt was at work when he himself noticed he was becoming more altered and told coworkers. Coworkers called EMS, EMS stated upon their arrival pt become more combative and not following instruction.  ? ?Upon pt arrival he was continuous to become combative. Pt given 1mg  of ativan  ?

## 2021-08-24 NOTE — Consult Note (Signed)
NEUROLOGY CONSULTATION NOTE  ? ?Date of service: Aug 24, 2021 ?Patient Name: Victor Bryan ?MRN:  [id number] ?DOB:  May 27, 1980 ?Reason for consult: "Gaze deviation" ?_ _ _   _ __   _ __ _ _  __ __   _ __   __ _ ? ?History of Present Illness  ?Victor Bryan is a 41 y.o. male with PMH significant for  has a past medical history of Atrial fibrillation ([place]), Diabetes mellitus without complication ([place]), Hyperlipidemia, Hypertension, and Migraine. who presents with altered mental status. ? ?Patient was apparently in his usual state of health until earlier today at work.  Patient apparently acutely began to feel poorly, stating to co-worker "something is wrong".  Subsequently became altered and EMS was called.  In the ED the patient became more agitated despite administrating Ativan and Geodon.  He was found to have significantly elevated blood pressures around 230 for SBP.  Patient was started on Cleviprex infusion.  Subsequently patient was noted to have deviated gaze, down and out to the left.  ? ?On evaluation of the patient, patient became acutely agitated again requiring more Ativan.  Patient was able to move all of his extremities spontaneously as well as had spontaneous movement of his eyes in multiple directions crossing midline at that time.  Patient was unable to respond to verbal commands due to agitation. ? ? ?ROS  ?See above ? ?Past History  ? ?Past Medical History:  ?Diagnosis Date  ?? Atrial fibrillation ([place])   ?? Diabetes mellitus without complication ([place])   ?? Hyperlipidemia   ?? Hypertension   ?? Migraine   ? ?Past Surgical History:  ?Procedure Laterality Date  ?? TONSILLECTOMY    ? ?No family history on file. ?Social History  ? ?Socioeconomic History  ?? Marital status: Single  ?  Spouse name: Not on file  ?? Number of children: Not on file  ?? Years of education: Not on file  ?? Highest education level: Not on file  ?Occupational History  ?? Not on file  ?Tobacco Use  ?? Smoking status: Never  ??  Smokeless tobacco: Never  ?Substance and Sexual Activity  ?? Alcohol use: Yes  ?? Drug use: Yes  ?  Types: Marijuana  ?? Sexual activity: Yes  ?Other Topics Concern  ?? Not on file  ?Social History Narrative  ?? Not on file  ? ?Social Determinants of Health  ? ?Financial Resource Strain: Not on file  ?Food Insecurity: Not on file  ?Transportation Needs: Not on file  ?Physical Activity: Not on file  ?Stress: Not on file  ?Social Connections: Not on file  ? ?Allergies  ?Allergen Reactions  ?? Morphine Itching  ? ? ?Medications  ?(Not in a hospital admission) ?  ? ?Vitals  ? ?Vitals:  ? 08/24/21 1500 08/24/21 1515 08/24/21 1530 08/24/21 1545  ?BP: (!) 180/119 (!) 168/122 (!) 160/109 (!) 176/111  ?Pulse: (!) 113 (!) 114 (!) 113 (!) 113  ?Resp: (!) 24 (!) 21 (!) 25 (!) 26  ?Temp:      ?TempSrc:      ?SpO2: 99% 98% 98% 98%  ?Weight:      ?Height:      ?  ? ?Body mass index is 38.4 kg/m?. ? ?Physical Exam  ? ?General: Laying in bed, nonresponsive to verbal commands, acutely agitated ?HENT: Normal oropharynx and mucosa. Normal external appearance of ears and nose.  ?Neck: Supple, no pain or tenderness ?CV: RRR. No peripheral edema.  ?Pulmonary: Symmetric Chest rise.  Normal respiratory effort.  ?Ext: No cyanosis, edema, or deformity  ?Skin: No rash. Normal palpation of skin.   ?Musculoskeletal: Normal digits and nails by inspection. No clubbing.  ? ?Neurologic Examination  ?Mental status/Cognition: Altered, responding to verbal commands and acutely agitated  ?Speech/language: Unable to assess verbal/language capacity due to AMS ?Cranial nerves:  ? CN II Pupils equal and reactive to light,  ? CN III,IV,VI EOM intact appeared intact unable to perform true extraocular motion exam to assess for nystagmus  ? CN V patient responded to sensation on his face reaching his hand towards the examiners hand  ? CN VII no asymmetry, no nasolabial fold flattening   ? CN VIII Victor Bryan to determine due to AMS  ? CN IX & X Difficult to determine  due to AMS  ? CN XI Patient was able to move his head left and right  ? CN XII Could not evaluate   ? ?Motor:  ?Muscle bulk: Normal, tone normal,  ?Strength.  Normal in upper and lower extremities but was unable to provide purposeful movement for exam. ? ?Sensation: ?Would respond to tactile stimuli ? ?Coordination/Complex Motor:  ?Unable to perform ? ?Labs  ? ?CBC:  ?Recent Labs  ?Lab 08/24/21 ?1127 08/24/21 ?1203 08/24/21 ?1204  ?WBC 9.3  --   --   ?NEUTROABS 5.1  --   --   ?HGB 15.0 15.3 15.0  ?HCT 44.7 45.0 44.0  ?MCV 76.9*  --   --   ?PLT 309  --   --   ? ? ?Basic Metabolic Panel:  ?Lab Results  ?Component Value Date  ? NA 140 08/24/2021  ? K 3.5 08/24/2021  ? CO2 23 08/24/2021  ? GLUCOSE 187 (H) 08/24/2021  ? BUN 23 (H) 08/24/2021  ? CREATININE 3.00 (H) 08/24/2021  ? CALCIUM 9.5 08/24/2021  ? GFRNONAA 28 (L) 08/24/2021  ? ?Lipid Panel:  ?Lab Results  ?Component Value Date  ? LDLCALC 143 (H) 05/31/2021  ? ?HgbA1c:  ?Lab Results  ?Component Value Date  ? HGBA1C 7.8 (H) 05/31/2021  ? ?Urine Drug Screen:  ?   ?Component Value Date/Time  ? LABOPIA NONE DETECTED 08/24/2021 1330  ? COCAINSCRNUR NONE DETECTED 08/24/2021 1330  ? LABBENZ NONE DETECTED 08/24/2021 1330  ? AMPHETMU NONE DETECTED 08/24/2021 1330  ? THCU NONE DETECTED 08/24/2021 1330  ? LABBARB NONE DETECTED 08/24/2021 1330  ?  ?Alcohol Level  ?   ?Component Value Date/Time  ? ETH <10 08/24/2021 1127  ? ? ?CT Head without contrast: ?No acute intracranial hemorrhages or other intercranial pathology noted on CT head ? ?CT angio Head and Neck with contrast: Pending ? ?MRI Brain Pending ? ?rEEG: Pending ? ?Impression  ? ?Acute encephalopathy ?Patient presents with acute onset altered mental status.  Patient's history is concerning for seizure with aura.  Patient originally had fairly pronounced left downward gaze deviation concerning for central process such as cerebral/brainstem infarct or aneurysm we will need to get further imaging to further evaluate these  etiologies including MRA head and MRI brain w/o contrast (due to kidney function).  Additionally, the patient will need a stat EEG with continuous EEG overnight due to concern for underlying seizure.  ? ?Hypertensive emergency: ?Patient presents with hypertensive emergency with systolics in the mid 412I.  Patient placed on Cleviprex drip.  Being managed by primary team. ? ?AKI on CKD ?Patient presents with an acute increase in his creatinine from 2.1 to 2.84 limiting gadolinium contrast.  ? ?Recommendations  ? ?Acute encephalopathy: ?-  Stat EEG ?- Continuous EEG overnight with MRI compatible leads ?- MRA head w/o contrast ?- MRI head w/o contrast pending  ?- Agree with admission to ICU for frequent neurochecks ?- Further recommendations based on results.  ?______________________________________________________________________ ? ? ?Thank you for the opportunity to take part in the care of this patient. If you have any further questions, please contact the neurology consultation attending. ? ?Signed, ? ? ?Lawerance Cruel, D.O.  ?Internal Medicine Resident, PGY-3 ?[facility]  ?5:01 PM, 08/24/2021  ? ?

## 2021-08-24 NOTE — Progress Notes (Addendum)
vLTM started all impedances below 10kohms  [facility] to monitor   Patient event button tested   MRI compatible leads used for setup of EEG ?

## 2021-08-24 NOTE — Progress Notes (Signed)
eLink Physician-Brief Progress Note ?Patient Name: Victor Bryan ?DOB: 11-18-1980 ?MRN: [id number] ? ? ?Date of Service ? 08/24/2021  ?HPI/Events of Note ? Severe restless agitation despite ativan 1 mg IV x 1.  Stable from hemodynamic and resp standpoint.  ?eICU Interventions ? Ativan 2 mg IV x 1 ordered now and prn dose increased  ? ? ? ?Intervention Category ?Major Interventions: Delirium, psychosis, severe agitation - evaluation and management ? ?Mauri Brooklyn, P ?08/24/2021, 9:11 PM ?

## 2021-08-24 NOTE — Progress Notes (Signed)
STAT EEG complete - results pending. ? ?

## 2021-08-24 NOTE — Progress Notes (Signed)
ANTICOAGULATION CONSULT NOTE - Initial Consult ? ?Pharmacy Consult for heparin ?Indication: atrial fibrillation ? ?Allergies  ?Allergen Reactions  ? Morphine Itching  ? ? ?Patient Measurements: ?Height: 5\' 9"  (175.3 cm) ?Weight: 117.9 kg (260 lb) ?IBW/kg (Calculated) : 70.7 ?Heparin Dosing Weight: 97 kg  ? ?Vital Signs: ?Temp: 98.6 ?F (37 ?C) (05/11 1135) ?Temp Source: Temporal (05/11 1135) ?BP: 176/111 (05/11 1545) ?Pulse Rate: 113 (05/11 1545) ? ?Labs: ?Recent Labs  ?  08/24/21 ?1127 08/24/21 ?1203 08/24/21 ?1204  ?HGB 15.0 15.3 15.0  ?HCT 44.7 45.0 44.0  ?PLT 309  --   --   ?CREATININE 2.84*  --  3.00*  ?TROPONINIHS 25*  --   --   ? ? ?Estimated Creatinine Clearance: 40.7 mL/min (A) (by C-G formula based on SCr of 3 mg/dL (H)). ? ? ?Medical History: ?Past Medical History:  ?Diagnosis Date  ? Atrial fibrillation ([place])   ? Diabetes mellitus without complication ([place])   ? Hyperlipidemia   ? Hypertension   ? Migraine   ? ? ?Medications:  ?(Not in a hospital admission)  ? ?Assessment: ?1 YOM who is admitted with acute encephalopathy and hypertensive crisis. Pharmacy consulted to start IV heparin for AFib. Of note, patient has been on Eliquis in the past but unable to verify if he is still on the medication.  ? ?H/H and Plt wnl. SCr elevated at 3 ? ?Goal of Therapy:  ?Heparin level 0.3-0.7 units/ml ?aPTT 66-102 seconds ?Monitor platelets by anticoagulation protocol: Yes ?  ?Plan:  ?-Start heparin at 1500 units/hr. No bolus for now  ?-F/u 6 hr HL and aPTT since unsure if patient has taken Eliquis recently ?-Monitor daily HL, aPTT and s/s of bleeding ? ?Albertina Parr, PharmD., BCCCP ?Clinical Pharmacist ?Please refer to [facility]ION for unit-specific pharmacist  ? ? ? ?

## 2021-08-24 NOTE — Procedures (Signed)
Patient Name: Victor Bryan  ?MRN: [id number]  ?Epilepsy Attending: Lora Havens  ?Referring Physician/Provider: Julian Hy, DO ?Date: 08/24/2021 ?Duration: 20.52 mins ? ?Patient history: 41yo M patient presents with acute onset altered mental status. EEG to evaluate for seizure ? ?Level of alertness: lethargic  ? ?AEDs during EEG study: None ? ?Technical aspects: This EEG study was done with scalp electrodes positioned according to the 10-20 International system of electrode placement. Electrical activity was acquired at a sampling rate of 500Hz  and reviewed with a high frequency filter of 70Hz  and a low frequency filter of 1Hz . EEG data were recorded continuously and digitally stored.  ? ?Description: EEG showed continuous generalized 2-3Hz  delta slowing. Hyperventilation and photic stimulation were not performed.    ? ?ABNORMALITY ?- Continuous slow, generalized ? ?IMPRESSION: ?This study is suggestive of moderate to severe diffuse encephalopathy, nonspecific etiology. No seizures or epileptiform discharges were seen throughout the recording. ? ?Dr Quinn Axe was notified. ? ?Lora Havens  ? ?

## 2021-08-24 NOTE — ED Provider Notes (Signed)
?[facility] ?Provider Note ? ? ?CSN: [id number] ?Arrival date & time: 08/24/21  1045 ? ?  ? ?History ? ?Chief Complaint  ?Patient presents with  ? Altered Mental Status  ? ? ?Victor Bryan is a 41 y.o. male with diabetes, htn, and ckd3a who presents to the ED with altered mental status. Unable to obtain history from the patient due to his current clinical status. Per EMS, the patient was at work when he noticed he was becoming more confused, so he notified his coworkers of this. His coworkers called EMS and upon their arrival, the patient became more agitated and combative. When the patient arrived to the ED he continued to be combative. He is not able to tell us his name and he remains altered at the time of initial evaluation.  ? ?The history is provided by the EMS personnel. No language interpreter was used.  ?Altered Mental Status ?Presenting symptoms: combativeness and confusion   ?Most recent episode:  Today ?Timing:  Constant ?Progression:  Unchanged ?Chronicity:  New ?Associated symptoms: agitation   ? ?  ? ?Home Medications ?Prior to Admission medications   ?Medication Sig Start Date End Date Taking? Authorizing Provider  ?acetaminophen (TYLENOL) 500 MG tablet Take 500 mg by mouth every 6 (six) hours as needed for mild pain or moderate pain.    [provider]  ?apixaban (ELIQUIS) 5 MG TABS tablet Take 1 tablet (5 mg total) by mouth 2 (two) times daily. 05/31/21   Cherene Altes, MD  ?butalbital-acetaminophen-caffeine (FIORICET) [phone] MG tablet Take 1 tablet by mouth every 8 (eight) hours as needed for headache or migraine. 03/20/21   [provider]  ?Serina Cowper 2 MG/0.85ML AUIJ Inject 2 mg into the skin once a week. 03/14/21   [provider]  ?carvedilol (COREG) 12.5 MG tablet Take 12.5 mg by mouth 2 (two) times daily. 06/16/21   [provider]  ?citalopram (CELEXA) 20 MG tablet Take 20 mg by mouth daily.    [provider]  ?citalopram (CELEXA) 40 MG tablet Take 40 mg by mouth daily. 06/14/21   [provider]  ?diltiazem (TIAZAC) 180 MG 24 hr capsule Take 180 mg by mouth daily. 08/28/20   [provider]  ?furosemide (LASIX) 20 MG tablet Take 20 mg by mouth 2 (two) times daily. 03/14/21   [provider]  ?gabapentin (NEURONTIN) 600 MG tablet Take 600 mg by mouth 3 (three) times daily. 03/14/21   [provider]  ?hydrALAZINE (APRESOLINE) 25 MG tablet Take 25 mg by mouth 3 (three) times daily. 03/14/21   [provider]  ?hydrALAZINE (APRESOLINE) 50 MG tablet Take 50 mg by mouth 3 (three) times daily. 06/16/21   [provider]  ?LEVEMIR FLEXTOUCH 100 UNIT/ML FlexTouch Pen Inject 50 Units into the skin at bedtime. 12/23/20   [provider]  ?NOVOLOG FLEXPEN 100 UNIT/ML FlexPen 18 Units 3 (three) times daily with meals. 03/15/21   [provider]  ?omeprazole (PRILOSEC) 40 MG capsule Take 40 mg by mouth daily. 12/21/20   [provider]  ?pantoprazole (PROTONIX) 40 MG tablet Take 40 mg by mouth 2 (two) times daily. 06/13/21   [provider]  ?potassium chloride (KLOR-CON) 10 MEQ tablet Take 10 mEq by mouth 2 (two) times daily. 12/21/20   [provider]  ?prochlorperazine (COMPAZINE) 10 MG tablet Take 1 tablet (10 mg total) by mouth 2 (two) times daily as needed for nausea or vomiting (and headache).  08/01/21   Tegeler, Gwenyth Allegra, MD  ?sildenafil (REVATIO) 20 MG tablet Take 40-60 mg by mouth daily as needed. 06/17/21   [provider]  ?   ? ?Allergies    ?Morphine   ? ?Review of Systems   ?Review of Systems  ?Reason unable to perform ROS: Unable to obtain ROS due to patient's AMS.  ?Psychiatric/Behavioral:  Positive for agitation and confusion.   ? ?Physical Exam ?Updated Vital Signs ?BP (!) 180/119   Pulse (!) 113   Temp 98.6 ?F (37 ?C) (Temporal)   Resp (!) 24   Ht 5\' 9"  (1.753 m)   Wt 117.9 kg   SpO2 99%   BMI  38.40 kg/m?  ?Physical Exam ?Constitutional:   ?   Comments: Not alert. Patient is altered and combative  ?HENT:  ?   Head: Normocephalic and atraumatic.  ?Cardiovascular:  ?   Rate and Rhythm: Regular rhythm. Tachycardia present.  ?   Pulses: Normal pulses.  ?   Heart sounds: No murmur heard. ?Pulmonary:  ?   Effort: Pulmonary effort is normal. No respiratory distress.  ?Abdominal:  ?   General: There is no distension.  ?   Palpations: Abdomen is soft.  ?   Tenderness: There is no abdominal tenderness.  ?Musculoskeletal:  ?   Right lower leg: No edema.  ?   Left lower leg: No edema.  ?Skin: ?   General: Skin is warm and dry.  ?Neurological:  ?   Mental Status: He is disoriented.  ?   Comments: Not oriented to self, place, or time. Unable to follow commands. Combative   ? ? ? ?ED Results / Procedures / Treatments   ?Labs ?(all labs ordered are listed, but only abnormal results are displayed) ?Labs Reviewed  ?COMPREHENSIVE METABOLIC PANEL - Abnormal; Notable for the following components:  ?    Result Value  ? Potassium 3.4 (*)   ? Glucose, Bld 182 (*)   ? Creatinine, Ser 2.84 (*)   ? Total Protein 6.2 (*)   ? Albumin 2.5 (*)   ? GFR, Estimated 28 (*)   ? All other components within normal limits  ?CBC WITH DIFFERENTIAL/PLATELET - Abnormal; Notable for the following components:  ? MCV 76.9 (*)   ? MCH 25.8 (*)   ? All other components within normal limits  ?URINALYSIS, ROUTINE W REFLEX MICROSCOPIC - Abnormal; Notable for the following components:  ? Glucose, UA 150 (*)   ? Hgb urine dipstick SMALL (*)   ? Protein, ur 100 (*)   ? Bacteria, UA RARE (*)   ? All other components within normal limits  ?I-STAT CHEM 8, ED - Abnormal; Notable for the following components:  ? BUN 23 (*)   ? Creatinine, Ser 3.00 (*)   ? Glucose, Bld 187 (*)   ? All other components within normal limits  ?I-STAT VENOUS BLOOD GAS, ED - Abnormal; Notable for the following components:  ? pH, Ven 7.515 (*)   ? pCO2, Ven 34.1 (*)   ? pO2, Ven 189 (*)    ? Acid-Base Excess 5.0 (*)   ? All other components within normal limits  ?CBG MONITORING, ED - Abnormal; Notable for the following components:  ? Glucose-Capillary 185 (*)   ? All other components within normal limits  ?TROPONIN I (HIGH SENSITIVITY) - Abnormal; Notable for the following components:  ? Troponin I (High Sensitivity) 69 (*)   ? All other components within normal limits  ?RAPID URINE DRUG  SCREEN, HOSP PERFORMED  ?ETHANOL  ?TROPONIN I (HIGH SENSITIVITY)  ? ? ?EKG ?EKG Interpretation ? ?Date/Time:  Thursday Aug 24 2021 10:53:35 EDT ?Ventricular Rate:  99 ?PR Interval:  164 ?QRS Duration: 96 ?QT Interval:  360 ?QTC Calculation: 462 ?R Axis:   84 ?Text Interpretation: Sinus rhythm Probable LVH with secondary repol abnrmality Confirmed by Madalyn Rob [phone]) on 08/24/2021 10:55:37 AM ? ?Radiology ?CT Head Wo Contrast ? ?Result Date: 08/24/2021 ?CLINICAL DATA:  Altered mental status EXAM: CT HEAD WITHOUT CONTRAST TECHNIQUE: Contiguous axial images were obtained from the base of the skull through the vertex without intravenous contrast. RADIATION DOSE REDUCTION: This exam was performed according to the departmental dose-optimization program which includes automated exposure control, adjustment of the mA and/or kV according to patient size and/or use of iterative reconstruction technique. COMPARISON:  CT head 08/01/2021 FINDINGS: Brain: There is no acute intracranial hemorrhage, extra-axial fluid collection, or acute infarct. Parenchymal volume is normal. The ventricles are normal in size. Gray-white differentiation is preserved. There is no mass lesion.  There is no mass effect or midline shift. Vascular: No hyperdense vessel or unexpected calcification. Skull: Normal. Negative for fracture or focal lesion. Sinuses/Orbits: There are retention cysts in the right frontal and maxillary sinuses. The globes and orbits are unremarkable. Other: None. IMPRESSION: No acute intracranial pathology. Electronically  Signed   By: Valetta Mole M.D.   On: 08/24/2021 13:58   ? ?Procedures ?Procedures  ? ? ?Medications Ordered in ED ?Medications  ?LORazepam (ATIVAN) injection 1 mg (has no administration in time range)  ?clevidip

## 2021-08-24 NOTE — Progress Notes (Signed)
?   08/24/21 1040  ?Clinical Encounter Type  ?Visited With Patient not available;Health care provider  ?Visit Type Code;Initial ?(STEMI)  ?Referral From Nurse  ?Consult/Referral To Chaplain ?Albertina Parr [place])  ? ?Responded to page in E.D. Trauma Room A for CODE STEMI. Patient being evaluated and treated by medical staff at this time, patient not seen by Chaplain. No family present at this time. Staff will page Chaplain upon request of patient or family.  ?Chaplain Tonie Vizcarrondo, M.Min., [phone].   ?

## 2021-08-24 NOTE — Progress Notes (Signed)
Victor Bryan [id number] ?Admission Data: 08/24/2021 6:23 PM ?Attending Provider: Julian Hy, DO  ?PCP:Pcp, No ?Consults/ Treatment Team:  ? ?[name] is a 41 y.o. male patient admitted from ED awake, alert  & orientated  X 3,  Full Code, VSS - Blood pressure (!) 140/121, pulse (!) 118, temperature 98.6 ?F (37 ?C), temperature source Temporal, resp. rate (!) 33, height 5\' 9"  (1.753 m), weight 117.9 kg, SpO2 99 %., O2 Tele # [id number] ? placed and pt is currently running:normal sinus rhythm. ? ? Skin, clean-dry- intact without evidence of bruising, or skin tears.  No evidence of skin break down noted on exam. ?Admitted to 68M room 15, ? belongings at bedside and wife is aware of the belongings, wallet was taken home by spouse.   ? ?Will cont to monitor and assist as needed. ?  ?Hosie Spangle, RN ?08/24/2021 6:23 PM  ?

## 2021-08-24 NOTE — H&P (Signed)
? ?NAME:  Victor Bryan, MRN:  [id number], DOB:  April 12, 1981, LOS: 0 ?ADMISSION DATE:  08/24/2021, CONSULTATION DATE:  08/24/21 ?REFERRING MD:  Roslynn Amble CHIEF COMPLAINT:  AMS, agitation, HTN  ? ?History of Present Illness:  ?Hence Victor Bryan is a 41 y.o. male who has a PMH as below including but not limited to DM, HTN, A.Fib, CKD. He presented to [facility] ED 5/11 from his work with [employer]. He apparently noticed that he was getting altered on his own and told coworkers who then called EMS.  On EMS arrival, he became combative and was not following any instructions/directions.  ?On ED arrival, he remained altered and combative despite 2mg  Ativan. He then received 20mg  Geodon with improvement in combativeness; however, SBP remained in the 200's.  He was given 10mg  Labetalol x 2 as well as 20mg  Hydralazine with no improvement. He was subsequently started on Cleviprex infusion. CT head was negative for any acute process. Highest documented SBP was 234 at 1300. ? ?Pt's fiance informed ED staff that pt had been in his USOH earlier that morning.  He had complained of headache for the last few days but otherwise was acting normally and had no other complaints. ? ?Pertinent  Medical History:  ?has Stroke [facility]); AKI (acute kidney injury) ([place]); Elevated troponin; Weakness; Paroxysmal atrial fibrillation ([place]); HTN (hypertension); Type 2 diabetes mellitus with complication, with long-term current use of insulin ([place]); and OSA (obstructive sleep apnea) on their problem list. ? ?Significant Hospital Events: ?Including procedures, antibiotic start and stop dates in addition to other pertinent events   ?5/11 admit. ? ?Interim History / Subjective:  ?Was agitated earlier in ED and received 2mg  Ativan and 20mg  Geodon.  Now sleeping, difficult to arouse but vitals stable. Does have downward gaze which is a bit concerning. ? ?Objective:  ?Blood pressure (!) 180/119, pulse (!) 113, temperature 98.6 ?F (37 ?C), temperature source Temporal, resp. rate  (!) 24, height 5\' 9"  (1.753 m), weight 117.9 kg, SpO2 99 %. ?   ?   ?No intake or output data in the 24 hours ending 08/24/21 1522 ?Filed Weights  ? 08/24/21 1100  ?Weight: 117.9 kg  ? ? ?Examination: ?General: Adult male, somnolent after Ativan and Geodon. ?Neuro: Somnolent after Ativan and Geodon. Does pull against resistance/gravity. Does have downward gaze. ?HEENT: [place]/AT. Sclerae anicteric. Downward gaze. ?Cardiovascular: IRIR, no M/R/G.  ?Lungs: Respirations even and unlabored.  CTA bilaterally, No W/R/R. ?Abdomen: BS x 4, soft, NT/ND.  ?Musculoskeletal: No gross deformities, no edema.  ?Skin: Intact, warm, no rashes. ? ?Labs/imaging personally reviewed:  ?CT head 5/11 > negative. ?MRI brain 5/11 >  ? ?Assessment & Plan:  ? ?Hypertensive urgency - highest SBP in ED 234. Did not respond to Labetalol x 2, Hydralazine x 1 and ultimately started on Cleviprex. ?- Continue Cleviprex, goal SBP ~170. ?- PRN Hydralazine and Labetalol. ?- Continue home Carvedilol, Diltiazem, Furosemide, Hydralazine when able to take PO. ? ?Hx A.fib., HTN, HLD. ?- Heparin gtt in lieu of home Apixaban. ?- Continue home Apixaban when mental status improves and can take PO. ? ?Acute encephalopathy - ? 2/2 above, consider hypertensive encephalopathy vs PRES.  Does have downward gaze that is concerning for possible non-convulsive seizures. UDS negative and CT head negative. No obvious electrolyte abnormality to explain. ?- Get MRI brain for completeness. ?- Neuro consulted, appreciate the assistance. ?- Get EEG. ?- Supportive care. ?- Hold any sedating meds. ? ?AoCKD. ?- Supportive care. ?- Follow BMP. ? ?Hx DM. ?- SSI. ?- Hold  home Levemir, Novolog. ? ?Best practice (evaluated daily):  ?Diet/type: NPO  ?DVT prophylaxis: systemic heparin  ?GI prophylaxis: N/A ?Lines: N/A ?Foley:  N/A ?Code Status:  full code ?Last date of multidisciplinary goals of care discussion: None yet. ? ?Labs   ?CBC: ?Recent Labs  ?Lab 08/24/21 ?1127 08/24/21 ?1203  08/24/21 ?1204  ?WBC 9.3  --   --   ?NEUTROABS 5.1  --   --   ?HGB 15.0 15.3 15.0  ?HCT 44.7 45.0 44.0  ?MCV 76.9*  --   --   ?PLT 309  --   --   ? ? ?Basic Metabolic Panel: ?Recent Labs  ?Lab 08/24/21 ?1127 08/24/21 ?1203 08/24/21 ?1204  ?NA 140 139 140  ?K 3.4* 3.5 3.5  ?CL 107  --  105  ?CO2 23  --   --   ?GLUCOSE 182*  --  187*  ?BUN 19  --  23*  ?CREATININE 2.84*  --  3.00*  ?CALCIUM 9.5  --   --   ? ?GFR: ?Estimated Creatinine Clearance: 40.7 mL/min (A) (by C-G formula based on SCr of 3 mg/dL (H)). ?Recent Labs  ?Lab 08/24/21 ?1127  ?WBC 9.3  ? ? ?Liver Function Tests: ?Recent Labs  ?Lab 08/24/21 ?1127  ?AST 30  ?ALT 19  ?ALKPHOS 74  ?BILITOT 0.8  ?PROT 6.2*  ?ALBUMIN 2.5*  ? ?No results for input(s): LIPASE, AMYLASE in the last 168 hours. ?No results for input(s): AMMONIA in the last 168 hours. ? ?ABG ?   ?Component Value Date/Time  ? HCO3 27.5 08/24/2021 1203  ? TCO2 27 08/24/2021 1204  ? O2SAT 100 08/24/2021 1203  ?  ? ?Coagulation Profile: ?No results for input(s): INR, PROTIME in the last 168 hours. ? ?Cardiac Enzymes: ?No results for input(s): CKTOTAL, CKMB, CKMBINDEX, TROPONINI in the last 168 hours. ? ?HbA1C: ?Hgb A1c MFr Bld  ?Date/Time Value Ref Range Status  ?05/31/2021 02:12 AM 7.8 (H) 4.8 - 5.6 % Final  ?  Comment:  ?  (NOTE) ?Pre diabetes:          5.7%-6.4% ? ?Diabetes:              >6.4% ? ?Glycemic control for   <7.0% ?adults with diabetes ?  ? ? ?CBG: ?Recent Labs  ?Lab 08/24/21 ?1131  ?GLUCAP 185*  ? ? ?Review of Systems:   ?Unable to obtain as pt is encephalopathic. ? ?Past Medical History:  ?He,  has a past medical history of Atrial fibrillation ([place]), Diabetes mellitus without complication ([place]), Hyperlipidemia, Hypertension, and Migraine.  ? ?Surgical History:  ? ?Past Surgical History:  ?Procedure Laterality Date  ? TONSILLECTOMY    ?  ? ?Social History:  ? reports that he has never smoked. He has never used smokeless tobacco. He reports current alcohol use. He reports current drug use.  Drug: Marijuana.  ? ?Family History:  ?His family history is not on file.  ? ?Allergies ?Allergies  ?Allergen Reactions  ? Morphine Itching  ?  ? ?Home Medications  ?Prior to Admission medications   ?Medication Sig Start Date End Date Taking? Authorizing Provider  ?acetaminophen (TYLENOL) 500 MG tablet Take 500 mg by mouth every 6 (six) hours as needed for mild pain or moderate pain.    [provider]  ?apixaban (ELIQUIS) 5 MG TABS tablet Take 1 tablet (5 mg total) by mouth 2 (two) times daily. 05/31/21   Cherene Altes, MD  ?butalbital-acetaminophen-caffeine (FIORICET) [phone] MG tablet Take 1 tablet by mouth every  8 (eight) hours as needed for headache or migraine. 03/20/21   [provider]  ?Serina Cowper 2 MG/0.85ML AUIJ Inject 2 mg into the skin once a week. 03/14/21   [provider]  ?carvedilol (COREG) 12.5 MG tablet Take 12.5 mg by mouth 2 (two) times daily. 06/16/21   [provider]  ?citalopram (CELEXA) 20 MG tablet Take 20 mg by mouth daily.    [provider]  ?citalopram (CELEXA) 40 MG tablet Take 40 mg by mouth daily. 06/14/21   [provider]  ?diltiazem (TIAZAC) 180 MG 24 hr capsule Take 180 mg by mouth daily. 08/28/20   [provider]  ?furosemide (LASIX) 20 MG tablet Take 20 mg by mouth 2 (two) times daily. 03/14/21   [provider]  ?gabapentin (NEURONTIN) 600 MG tablet Take 600 mg by mouth 3 (three) times daily. 03/14/21   [provider]  ?hydrALAZINE (APRESOLINE) 25 MG tablet Take 25 mg by mouth 3 (three) times daily. 03/14/21   [provider]  ?hydrALAZINE (APRESOLINE) 50 MG tablet Take 50 mg by mouth 3 (three) times daily. 06/16/21   [provider]  ?LEVEMIR FLEXTOUCH 100 UNIT/ML FlexTouch Pen Inject 50 Units into the skin at bedtime. 12/23/20   [provider]  ?NOVOLOG FLEXPEN 100 UNIT/ML FlexPen 18 Units 3 (three) times daily with meals. 03/15/21   [provider]   ?omeprazole (PRILOSEC) 40 MG capsule Take 40 mg by mouth daily. 12/21/20   [provider]  ?pantoprazole (PROTONIX) 40 MG tablet Take 40 mg by mouth 2 (two) times daily. 06/13/21   Provider, Historical

## 2021-08-24 NOTE — ED Notes (Signed)
Pt unable to follow commands, this RN tried to replace pt back on monitor to gain accurate BP but was unsuccessful. MD made aware and told to hold medication  ?

## 2021-08-25 ENCOUNTER — Inpatient Hospital Stay (HOSPITAL_COMMUNITY): Payer: Medicare (Managed Care)

## 2021-08-25 DIAGNOSIS — I674 Hypertensive encephalopathy: Secondary | ICD-10-CM | POA: Diagnosis not present

## 2021-08-25 DIAGNOSIS — N179 Acute kidney failure, unspecified: Secondary | ICD-10-CM | POA: Diagnosis not present

## 2021-08-25 DIAGNOSIS — N1832 Chronic kidney disease, stage 3b: Secondary | ICD-10-CM | POA: Diagnosis not present

## 2021-08-25 DIAGNOSIS — I161 Hypertensive emergency: Secondary | ICD-10-CM | POA: Diagnosis not present

## 2021-08-25 DIAGNOSIS — R4182 Altered mental status, unspecified: Secondary | ICD-10-CM | POA: Diagnosis not present

## 2021-08-25 LAB — BASIC METABOLIC PANEL
Anion gap: 10 (ref 5–15)
BUN: 23 mg/dL — ABNORMAL HIGH (ref 6–20)
CO2: 22 mmol/L (ref 22–32)
Calcium: 8.7 mg/dL — ABNORMAL LOW (ref 8.9–10.3)
Chloride: 106 mmol/L (ref 98–111)
Creatinine, Ser: 2.75 mg/dL — ABNORMAL HIGH (ref 0.61–1.24)
GFR, Estimated: 29 mL/min — ABNORMAL LOW (ref >60)
Glucose, Bld: 110 mg/dL — ABNORMAL HIGH (ref 70–99)
Potassium: 4.6 mmol/L (ref 3.5–5.1)
Sodium: 138 mmol/L (ref 135–145)

## 2021-08-25 LAB — MAGNESIUM: Magnesium: 1.9 mg/dL (ref 1.7–2.4)

## 2021-08-25 LAB — CBC
HCT: 39 % (ref 39.0–52.0)
Hemoglobin: 13.5 g/dL (ref 13.0–17.0)
MCH: 26.3 pg (ref 26.0–34.0)
MCHC: 34.6 g/dL (ref 30.0–36.0)
MCV: 76 fL — ABNORMAL LOW (ref 80.0–100.0)
Platelets: 136 10*3/uL — ABNORMAL LOW (ref 150–400)
RBC: 5.13 MIL/uL (ref 4.22–5.81)
RDW: 14.3 % (ref 11.5–15.5)
WBC: 11.3 10*3/uL — ABNORMAL HIGH (ref 4.0–10.5)
nRBC: 0 % (ref 0.0–0.2)

## 2021-08-25 LAB — GLUCOSE, CAPILLARY
Glucose-Capillary: 100 mg/dL — ABNORMAL HIGH (ref 70–99)
Glucose-Capillary: 107 mg/dL — ABNORMAL HIGH (ref 70–99)
Glucose-Capillary: 118 mg/dL — ABNORMAL HIGH (ref 70–99)
Glucose-Capillary: 128 mg/dL — ABNORMAL HIGH (ref 70–99)
Glucose-Capillary: 181 mg/dL — ABNORMAL HIGH (ref 70–99)
Glucose-Capillary: 183 mg/dL — ABNORMAL HIGH (ref 70–99)
Glucose-Capillary: 189 mg/dL — ABNORMAL HIGH (ref 70–99)

## 2021-08-25 LAB — HEPARIN LEVEL (UNFRACTIONATED)
Heparin Unfractionated: <0.1 IU/mL — ABNORMAL LOW (ref 0.30–0.70)
Heparin Unfractionated: <0.1 IU/mL — ABNORMAL LOW (ref 0.30–0.70)
Heparin Unfractionated: <0.1 IU/mL — ABNORMAL LOW (ref 0.30–0.70)

## 2021-08-25 LAB — PHOSPHORUS: Phosphorus: 4.1 mg/dL (ref 2.5–4.6)

## 2021-08-25 LAB — APTT: aPTT: 26 seconds (ref 24–36)

## 2021-08-25 MED ORDER — CHLORHEXIDINE GLUCONATE CLOTH 2 % EX PADS
6.0000 | MEDICATED_PAD | Freq: Every day | CUTANEOUS | Status: DC
  Administered 2021-08-24 – 2021-08-27 (×5): 6 via TOPICAL

## 2021-08-25 MED ORDER — ACETAMINOPHEN 325 MG PO TABS
650.0000 mg | ORAL_TABLET | Freq: Four times a day (QID) | ORAL | Status: DC | PRN
  Administered 2021-08-25 – 2021-08-31 (×9): 650 mg via ORAL
  Filled 2021-08-25 (×9): qty 2

## 2021-08-25 MED ORDER — HEPARIN BOLUS VIA INFUSION
2500.0000 [IU] | Freq: Once | INTRAVENOUS | Status: AC
  Administered 2021-08-25: 2500 [IU] via INTRAVENOUS
  Filled 2021-08-25: qty 2500

## 2021-08-25 NOTE — Progress Notes (Signed)
ANTICOAGULATION CONSULT NOTE - Follow Up Consult ? ?Pharmacy Consult for Heparin ?Indication: atrial fibrillation ? ?Allergies  ?Allergen Reactions  ? Morphine Itching  ? ? ?Patient Measurements: ?Height: 5\' 9"  (175.3 cm) ?Weight: 96.9 kg (213 lb 10 oz) ?IBW/kg (Calculated) : 70.7 ?Heparin Dosing Weight: 97 kg ? ?Vital Signs: ?Temp: 98.7 ?F (37.1 ?C) (05/12 0753) ?Temp Source: Axillary (05/12 0753) ?BP: 176/101 (05/12 0845) ?Pulse Rate: 114 (05/12 0845) ? ?Labs: ?Recent Labs  ?  08/24/21 ?1127 08/24/21 ?1203 08/24/21 ?1204 08/24/21 ?1648 08/25/21 ?0455 08/25/21 ?0388  ?HGB 15.0 15.3 15.0  --  13.5  --   ?HCT 44.7 45.0 44.0  --  39.0  --   ?PLT 309  --   --   --  136*  --   ?APTT  --   --   --   --   --  26  ?HEPARINUNFRC  --   --   --   --  <0.10* <0.10*  ?CREATININE 2.84*  --  3.00*  --  2.75*  --   ?TROPONINIHS 69*  --   --  42*  --   --   ? ? ?Estimated Creatinine Clearance: 40.2 mL/min (A) (by C-G formula based on SCr of 2.75 mg/dL (H)). ? ? ?Medications:  ?Infusions:  ? clevidipine 7 mg/hr (08/25/21 0759)  ? heparin 1,500 Units/hr (08/25/21 0841)  ? ? ?Assessment: ?41 years of age male admitted with acute encephalopathy and hypertensive crisis. Pharmacy consulted to start IV Heparin for atrial fibrillation. Of note, patient has been on Eliquis in past but was unable to verify if he is still on medication.  ? ?Initially a concern that labs were incorrect overnight due to clotting - repeated.  ?Heparin level undetectable x2 draws. aPTT also low at 26 after starting Heparin at 1500 units/hr.  ?Based on heparin levels, does not look like patient has been taking Eliquis.  ?Will used heparin levels to monitor.  ?H/H 15 >>13.5/44>>39. Acute drop in platelets 309 >>136 (baseline ~260-280s). ?No overt bleeding noted. No issues with infusion per discussion with RN Bailey Mech.  ?No recent heparin in last 100 days.  ? ?Goal of Therapy:  ?Heparin level 0.3-0.7 units/ml ?Monitor platelets by anticoagulation protocol: Yes ?   ?Plan:  ?Increase Heparin to 1800 units/hr. ?Heparin level in 6 hours.  ?Daily Heparin level and CBC.  ? ?Sloan Leiter, PharmD, BCPS, BCCCP ?Clinical Pharmacist ?Please refer to [facility] for [place] numbers ?08/25/2021,8:53 AM ? ? ?

## 2021-08-25 NOTE — Progress Notes (Signed)
Carotid artery duplex completed. ?Refer to "CV Proc" under chart review to view preliminary results. ? ?08/25/2021 11:44 AM ?Kelby Aline., MHA, RVT, RDCS, RDMS   ?

## 2021-08-25 NOTE — Progress Notes (Signed)
? ?NAME:  Victor Bryan, MRN:  [id number], DOB:  1980-10-29, LOS: 1 ?ADMISSION DATE:  08/24/2021, CONSULTATION DATE:  08/24/21 ?REFERRING MD:  Roslynn Amble CHIEF COMPLAINT:  AMS, agitation, HTN  ? ?History of Present Illness:  ?Victor Bryan is a 41 y.o. male who has a PMH as below including but not limited to DM, HTN, A.Fib, CKD. He presented to [facility] ED 5/11 from his work with [employer]. He apparently noticed that he was getting altered on his own and told coworkers who then called EMS.  On EMS arrival, he became combative and was not following any instructions/directions.  ?On ED arrival, he remained altered and combative despite 2mg  Ativan. He then received 20mg  Geodon with improvement in combativeness; however, SBP remained in the 200's.  He was given 10mg  Labetalol x 2 as well as 20mg  Hydralazine with no improvement. He was subsequently started on Cleviprex infusion. CT head was negative for any acute process. Highest documented SBP was 234 at 1300. ? ?Pt's fiance informed ED staff that pt had been in his USOH earlier that morning.  He had complained of headache for the last few days but otherwise was acting normally and had no other complaints. ? ?Pertinent  Medical History:  ?has Stroke [facility]); AKI (acute kidney injury) ([place]); Elevated troponin; Weakness; Paroxysmal atrial fibrillation ([place]); HTN (hypertension); Type 2 diabetes mellitus with complication, with long-term current use of insulin ([place]); OSA (obstructive sleep apnea); Hypertensive urgency; Hypertensive emergency; and Deviation of downward gaze on their problem list. ? ?Significant Hospital Events: ?Including procedures, antibiotic start and stop dates in addition to other pertinent events   ?5/11 admit, MRI with new subacute ischemic infarct ? ?Interim History / Subjective:  ?Agitated overnight, requiring sedatives due to risk for self-harm. ? ?He is sleeping during encounter, vitals stable. Discussed with patient's significant other and mother about plan of  care. ? ?Objective:  ?Blood pressure (!) 151/94, pulse (!) 113, temperature 98.7 ?F (37.1 ?C), temperature source Axillary, resp. rate (!) 24, height 5\' 9"  (1.753 m), weight 96.9 kg, SpO2 98 %. ?   ?   ? ?Intake/Output Summary (Last 24 hours) at 08/25/2021 1136 ?Last data filed at 08/25/2021 3532 ?Gross per 24 hour  ?Intake 670.05 ml  ?Output 965 ml  ?Net -294.95 ml  ? ?Filed Weights  ? 08/24/21 1100 08/25/21 0500  ?Weight: 117.9 kg 96.9 kg  ? ? ?Examination: ?General: middle aged male, somnolent, lying in bed, NAD. ?CV: tachycardic rate, irregularly irregular rhythm, no m/r/g. ?Pulm: CTA bilaterally, normal work of breathing on RA. ?Abdomen: soft, nondistended, +BS. ?Skin: warm and dry ?MSK: no peripheral edema noted. ?Neuro: somnolent on exam, difficult to be aroused, unable to cooperate with neuro exam. ? ?Labs/imaging personally reviewed:  ?CT head 5/11 > negative. ?MRI brain 5/11 > subacute ischemic infarct ? ?Assessment & Plan:  ? ?Acute encephalopathy ?Hypertensive Emergency ?HTN ?Patient presented with severely elevated BP along with signs of end-organ damage (AMS, AKI). BP better controlled now with IV antihypertensives. Ischemic infarct noted on MRI appears to be subacute so less likely to have caused patient's abrupt presentation. Brain imaging not consistent with PRES. Hopeful encephalopathy will resolve with adequate BP control and as sedative medications completely wear off. ?-neuro following, appreciate assistance ?-on IV cleviprex, hydral, lopressor for goal SBP 150-170 ?-NPO  ?-EEG with no seizure activity detected ?-required sitter, soft restraints, and ultimately pharmacotherapy overnight d/t agitation --- avoid sedatives unless absolutely necessary ?-can consider restarting home antihypertensives (hydral, coreg, dilt, lasix) once able to tolerate  PO ? ?Subacute Ischemic Infarct ?Hx of TIA ? (Feb 2023, MRI imaging during admission showing mid-R frontal lobe infarct) ?Unclear etiology. Possibly  related to underlying Afib. Unclear if patient fully compliant with home eliquis so may only be partially anticoagulated. Was not initiated on ASA/plavix or statin during last admission. ?-neuro following, appreciate assistance ?-on heparin gtt ?-likely no role for permissive HTN as stroke is subacute, will keep goal as noted above ?-consider ASA/plavix and statin once able to tolerate PO ? ?Atrial Fibrillation, on eliquis at home ?Unclear if patient is fully compliant with eliquis, family reports that he takes most doses (misses ~3-4 doses/week). Patient may only be partially anticoagulated, raising suspicion for AF-induced embolic infarct.  ?-on heparin gtt ?-consider restarting eliquis once able to tolerate PO ? ?AKI on CKD IIIB ?Hopeful for continued improvement with adequate BP control. ?-renal function improving ?-trend UOP, renal function ?-avoid nephrotoxic medications ? ?T2DM with hyperglycemia ?-holding home levemir and novolog ?-on moderate SSI ? ? ?Best practice (evaluated daily):  ?Diet/type: NPO  ?DVT prophylaxis: systemic heparin  ?GI prophylaxis: N/A ?Lines: N/A ?Foley:  N/A ?Code Status:  full code ?Last date of multidisciplinary goals of care discussion: None yet. ? ? ?Critical care time: 32 min.  ? ? ?Virl Axe, MD ?[facility] PGY-2 ?08/25/2021, 11:36 AM ? ?

## 2021-08-25 NOTE — Procedures (Addendum)
Patient Name: Mylik Pro  ?MRN: [id number]  ?Epilepsy Attending: Lora Havens  ?Referring Physician/Provider: Julian Hy, DO ?Duration: 08/24/2021 1830 to 08/25/2021 1830 ?  ?Patient history: 41yo M patient presents with acute onset altered mental status. EEG to evaluate for seizure ?  ?Level of alertness: lethargic  ?  ?AEDs during EEG study: Ativan ?  ?Technical aspects: This EEG study was done with scalp electrodes positioned according to the 10-20 International system of electrode placement. Electrical activity was acquired at a sampling rate of 500Hz  and reviewed with a high frequency filter of 70Hz  and a low frequency filter of 1Hz . EEG data were recorded continuously and digitally stored.  ?  ?Description: EEG showed continuous generalized 2-3Hz  delta slowing. Hyperventilation and photic stimulation were not performed.  ? ?EKG artifact was seen during the study.  ?    ?ABNORMALITY ?- Continuous slow, generalized ?  ?IMPRESSION: ?This study is suggestive of moderate to severe diffuse encephalopathy, nonspecific etiology. No seizures or epileptiform discharges were seen throughout the recording. ?  ?Lora Havens  ?

## 2021-08-25 NOTE — Progress Notes (Signed)
LTM maint complete - no skin breakdown under: FP1,F7 no skin breakdown. ?

## 2021-08-25 NOTE — Progress Notes (Signed)
Subjective: ? ?Victor Bryan is a 41 y.o. male with PMH significant for  has a past medical history of Atrial fibrillation ([place]), Diabetes mellitus without complication ([place]), Hyperlipidemia, Hypertension, and Migraine. who presents with altered mental status in the setting of HTN emergency.  ? ?Patient resting in bed and remains encephalopathic.  He is able to open his eyes and follow basic commands.  He can move all 4 extremities on command.  He quickly falls asleep and does not answer questions. ? ?Exam: ?Vitals:  ? 08/25/21 0845 08/25/21 0900  ?BP: (!) 176/101 (!) 159/101  ?Pulse: (!) 114 (!) 114  ?Resp: (!) 26 (!) 30  ?Temp:    ?SpO2: 99% 98%  ? ?Gen: In bed, NAD, sedated but able to follow basic commands  ?Resp: non-labored breathing, no acute distress ?Abd: soft, nt ? ?Neuro: ?MS: sedated/sleepy appearing  ?CN: grossly intact.  ?Motor: able to move all 4 extremities  ?Sensory: unable to full assess, but moves with physical stimuli ? ?Pertinent Labs: ? ?  Latest Ref Rng & Units 08/25/2021  ?  4:55 AM 08/24/2021  ? 12:04 PM 08/24/2021  ? 12:03 PM  ?CBC  ?WBC 4.0 - 10.5 K/uL 11.3      ?Hemoglobin 13.0 - 17.0 g/dL 13.5   15.0   15.3    ?Hematocrit 39.0 - 52.0 % 39.0   44.0   45.0    ?Platelets 150 - 400 K/uL 136      ? ? ?  Latest Ref Rng & Units 08/25/2021  ?  4:55 AM 08/24/2021  ? 12:04 PM 08/24/2021  ? 12:03 PM  ?CMP  ?Glucose 70 - 99 mg/dL 110   187     ?BUN 6 - 20 mg/dL 23   23     ?Creatinine 0.61 - 1.24 mg/dL 2.75   3.00     ?Sodium 135 - 145 mmol/L 138   140   139    ?Potassium 3.5 - 5.1 mmol/L 4.6   3.5   3.5    ?Chloride 98 - 111 mmol/L 106   105     ?CO2 22 - 32 mmol/L 22      ?Calcium 8.9 - 10.3 mg/dL 8.7      ? ? ?MRI HEAD IMPRESSION: ?  ?1. Motion degraded exam. ?2. 11 mm focus of mild diffusion abnormality involving the right ?paramedian splenium, most consistent with an evolving subacute ?ischemic infarct. No associated hemorrhage or mass effect. ?3. Mild cerebral white matter disease, nonspecific, but  most ?commonly related to chronic microvascular ischemic disease, stable. ?  ?MRA HEAD IMPRESSION: ?  ?Negative intracranial MRA. No large vessel occlusion, ?hemodynamically significant stenosis, or other acute vascular ?abnormality. ?  ?Carotid Dopplers: pending  ? ?Impression:  ? ?Acute encephalopathy secondary to hypertensive emergency: ?Patient presents with acute change in mentation in the setting of significantly elevated blood pressures concerning for hypertensive emergency.  There was urgently concern for possible seizure, however EEG has been negative since arrival.  Imaging findings shows evidence of subacute stroke but this is likely secondary to his significantly elevated blood pressures instead of true thrombosis.  Therefore, we will need to lower blood pressure slowly.  Patient is currently on a Codeprex drip and tolerating well.  Patient does have a history of stroke and will need to be on statin therapy.  However, he will not need antiplatelet therapy as he is on full dose Eliquis for atrial fibrillation. ? ?Recommendations: ?1) Acute encephalopathy secondary to hypertensive emergency: ?-Carotid  Dopplers pending ?-Recently had TEE, no need to repeat. ?-Continue statin therapy ?-Would like to benefit from repeat A1c and lipid panel ?-Continue Cleviprex drip and lower BP slowly over the following week ?- D/c EEG ? ? ?Lawerance Cruel, D.O.  ?Internal Medicine Resident, PGY-3 ?[facility]  ?10:05 AM, 08/25/2021  ?

## 2021-08-25 NOTE — Progress Notes (Signed)
eLink Physician-Brief Progress Note ?Patient Name: Dyson Sevey ?DOB: 02/27/1981 ?MRN: [id number] ? ? ?Date of Service ? 08/25/2021  ?HPI/Events of Note ? MRI with 11 mm focus on right suggestive of subacute ischemic infarct with negative MRA.  ?eICU Interventions ? Continue to follow neuro exam ?Monitor hemodynamics ?Notify neurology of findings  ? ? ? ?Intervention Category ?Intermediate Interventions: Other: ? ?Mauri Brooklyn, P ?08/25/2021, 2:03 AM ?

## 2021-08-25 NOTE — Progress Notes (Addendum)
ANTICOAGULATION CONSULT NOTE - Follow Up Consult ? ?Pharmacy Consult for Heparin ?Indication: atrial fibrillation ? ?Allergies  ?Allergen Reactions  ? Morphine Itching  ? ? ?Patient Measurements: ?Height: 5\' 9"  (175.3 cm) ?Weight: 96.9 kg (213 lb 10 oz) ?IBW/kg (Calculated) : 70.7 ?Heparin Dosing Weight: 97 kg ? ?Vital Signs: ?Temp: 98.5 ?F (36.9 ?C) (05/12 1541) ?Temp Source: Axillary (05/12 1541) ?BP: 195/110 (05/12 1530) ?Pulse Rate: 109 (05/12 1530) ? ?Labs: ?Recent Labs  ?  08/24/21 ?1127 08/24/21 ?1203 08/24/21 ?1204 08/24/21 ?1648 08/25/21 ?0455 08/25/21 ?0702 08/25/21 ?1541  ?HGB 15.0 15.3 15.0  --  13.5  --   --   ?HCT 44.7 45.0 44.0  --  39.0  --   --   ?PLT 309  --   --   --  136*  --   --   ?APTT  --   --   --   --   --  26  --   ?HEPARINUNFRC  --   --   --   --  <0.10* <0.10* <0.10*  ?CREATININE 2.84*  --  3.00*  --  2.75*  --   --   ?TROPONINIHS 69*  --   --  42*  --   --   --   ? ? ? ?Estimated Creatinine Clearance: 40.2 mL/min (A) (by C-G formula based on SCr of 2.75 mg/dL (H)). ? ? ?Medications:  ?Infusions:  ? clevidipine 11 mg/hr (08/25/21 1453)  ? heparin 1,800 Units/hr (08/25/21 1601)  ? ? ?Assessment: ?41 years of age male admitted with acute encephalopathy and hypertensive crisis. Pharmacy consulted to start IV Heparin for atrial fibrillation. Of note, patient has been on Eliquis in past but was unable to verify if he is still on medication.  ? ?Initially a concern that labs were incorrect overnight due to clotting - repeated.  ?Heparin level undetectable x2 draws. aPTT also low at 26 after starting Heparin at 1500 units/hr.  ?Based on heparin levels, does not look like patient has been taking Eliquis.  ?Will used heparin levels to monitor.  ?H/H 15 >>13.5/44>>39. Acute drop in platelets 309 >>136 (baseline ~260-280s). ?No overt bleeding noted. No issues with infusion per discussion with RN Bailey Mech.  ?No recent heparin in last 100 days.  ? ?Heparin level is still undetectable without issue with  drip per RN. We will give a small bolus and increase rate again.  ? ?Addendum ? ?Apparently the line dislodged. We will f/u with level after the drip is restarted.  ? ?Goal of Therapy:  ?Heparin level 0.3-0.7 units/ml ?Monitor platelets by anticoagulation protocol: Yes ?  ?Plan:  ?Heparin bolus 2500 units x1 ?Increase Heparin to 2100 units/hr. ?Heparin level in 8 hours.  ?Daily Heparin level and CBC.  ? ?Onnie Boer, PharmD, BCIDP, AAHIVP, CPP ?Infectious Disease Pharmacist ?08/25/2021 4:44 PM ? ?F/u restart of heparin. ? ?Onnie Boer, PharmD, BCIDP, AAHIVP, CPP ?Infectious Disease Pharmacist ?08/25/2021 9:36 PM ? ? ? ? ? ?

## 2021-08-26 DIAGNOSIS — I161 Hypertensive emergency: Secondary | ICD-10-CM | POA: Diagnosis not present

## 2021-08-26 DIAGNOSIS — J9601 Acute respiratory failure with hypoxia: Secondary | ICD-10-CM

## 2021-08-26 DIAGNOSIS — I674 Hypertensive encephalopathy: Secondary | ICD-10-CM

## 2021-08-26 DIAGNOSIS — R4182 Altered mental status, unspecified: Secondary | ICD-10-CM | POA: Diagnosis not present

## 2021-08-26 LAB — BASIC METABOLIC PANEL
Anion gap: 6 (ref 5–15)
BUN: 29 mg/dL — ABNORMAL HIGH (ref 6–20)
CO2: 22 mmol/L (ref 22–32)
Calcium: 8.3 mg/dL — ABNORMAL LOW (ref 8.9–10.3)
Chloride: 109 mmol/L (ref 98–111)
Creatinine, Ser: 2.78 mg/dL — ABNORMAL HIGH (ref 0.61–1.24)
GFR, Estimated: 28 mL/min — ABNORMAL LOW (ref >60)
Glucose, Bld: 85 mg/dL (ref 70–99)
Potassium: 3.6 mmol/L (ref 3.5–5.1)
Sodium: 137 mmol/L (ref 135–145)

## 2021-08-26 LAB — GLUCOSE, CAPILLARY
Glucose-Capillary: 137 mg/dL — ABNORMAL HIGH (ref 70–99)
Glucose-Capillary: 88 mg/dL (ref 70–99)
Glucose-Capillary: 131 mg/dL — ABNORMAL HIGH (ref 70–99)
Glucose-Capillary: 150 mg/dL — ABNORMAL HIGH (ref 70–99)
Glucose-Capillary: 151 mg/dL — ABNORMAL HIGH (ref 70–99)
Glucose-Capillary: 131 mg/dL — ABNORMAL HIGH (ref 70–99)

## 2021-08-26 LAB — HEPARIN LEVEL (UNFRACTIONATED): Heparin Unfractionated: >1.1 IU/mL — ABNORMAL HIGH (ref 0.30–0.70)

## 2021-08-26 LAB — TRIGLYCERIDES: Triglycerides: 56 mg/dL (ref <150)

## 2021-08-26 MED ORDER — OXYCODONE HCL 5 MG PO TABS
5.0000 mg | ORAL_TABLET | Freq: Four times a day (QID) | ORAL | Status: DC | PRN
  Administered 2021-08-26 – 2021-08-27 (×2): 5 mg via ORAL
  Filled 2021-08-26 (×2): qty 1

## 2021-08-26 MED ORDER — CARVEDILOL 12.5 MG PO TABS
12.5000 mg | ORAL_TABLET | Freq: Two times a day (BID) | ORAL | Status: DC
  Administered 2021-08-26 – 2021-08-31 (×9): 12.5 mg via ORAL
  Filled 2021-08-26 (×10): qty 1

## 2021-08-26 MED ORDER — POTASSIUM CHLORIDE CRYS ER 20 MEQ PO TBCR
20.0000 meq | EXTENDED_RELEASE_TABLET | Freq: Once | ORAL | Status: AC
  Administered 2021-08-26: 20 meq via ORAL
  Filled 2021-08-26: qty 1

## 2021-08-26 MED ORDER — APIXABAN 5 MG PO TABS
5.0000 mg | ORAL_TABLET | Freq: Two times a day (BID) | ORAL | Status: DC
  Administered 2021-08-26 – 2021-08-31 (×11): 5 mg via ORAL
  Filled 2021-08-26 (×11): qty 1

## 2021-08-26 MED ORDER — HEPARIN (PORCINE) 25000 UT/250ML-% IV SOLN
1800.0000 [IU]/h | INTRAVENOUS | Status: DC
  Administered 2021-08-26: 1800 [IU]/h via INTRAVENOUS

## 2021-08-26 MED ORDER — OXYCODONE HCL 5 MG PO TABS
2.5000 mg | ORAL_TABLET | Freq: Once | ORAL | Status: AC
Start: 2021-08-26 — End: 2021-08-26
  Administered 2021-08-26: 2.5 mg via ORAL
  Filled 2021-08-26: qty 1

## 2021-08-26 MED ORDER — HYDRALAZINE HCL 50 MG PO TABS
50.0000 mg | ORAL_TABLET | Freq: Three times a day (TID) | ORAL | Status: DC
  Administered 2021-08-26 – 2021-08-28 (×7): 50 mg via ORAL
  Filled 2021-08-26 (×7): qty 1

## 2021-08-26 NOTE — Progress Notes (Signed)
Patient fell trying to stand up to use the bathroom. Patient states his "knees buckled" and he fell to his left, "catching himself" somewhat. Patient states he was using the urinal and went to close the top and sit back down on the bed and missed the bed. Patient denies feeling dizzy. ? ?Patient's fiance was in the room and witnessed the fall and notified RN ASAP. ? ?RN assessed his left hip and bottom with no skin breakdown, no redness and no bruising. Patient denies pain from the fall. ? ?MD, Dr. Tacy Learn, notified and post-fall documentation completed. Agricultural consultant also notified. ? ?Patient and fiance re-educated to press the call bell for any mobility assistance.  ? ?Victor Bryan ? ?

## 2021-08-26 NOTE — Progress Notes (Signed)
? ?NAME:  Victor Bryan, MRN:  [id number], DOB:  [birth year], LOS: 2 ?ADMISSION DATE:  08/24/2021, CONSULTATION DATE:  08/24/21 ?REFERRING MD:  Roslynn Amble CHIEF COMPLAINT:  AMS, agitation, HTN  ? ?History of Present Illness:  ?Victor Bryan is a 41 y.o. male who has a PMH as below including but not limited to DM, HTN, A.Fib, CKD. 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 He apparently noticed that he was getting altered on his own and told coworkers who then called EMS.  On EMS arrival, he became combative and was not following any instructions/directions.  ?On ED arrival, he remained altered and combative despite 2mg  Ativan. He then received 20mg  Geodon with improvement in combativeness; however, SBP remained in the 200's.  He was given 10mg  Labetalol x 2 as well as 20mg  Hydralazine with no improvement. He was subsequently started on Cleviprex infusion. CT head was negative for any acute process. Highest documented SBP was 234 at 1300. ? ?Pt's fiance informed ED staff that pt had been in his USOH earlier that morning.  He had complained of headache for the last few days but otherwise was acting normally and had no other complaints. ? ?Pertinent  Medical History:  ?has Stroke [facility]); AKI (acute kidney injury) ([place]); Elevated troponin; Weakness; Paroxysmal atrial fibrillation ([place]); HTN (hypertension); Type 2 diabetes mellitus with complication, with long-term current use of insulin ([place]); OSA (obstructive sleep apnea); Hypertensive urgency; Hypertensive emergency; and Deviation of downward gaze on their problem list. ? ?Significant Hospital Events: ?Including procedures, antibiotic start and stop dates in addition to other pertinent events   ?5/11 admit, MRI with new subacute ischemic infarct ?5/12 agitated overnight, requiring ativan, remains on cleviprex, SBP goal 150-170 ? ?Interim History / Subjective:  ?? If IV's were bad, new ones placed and requiring much less cleviprex and heparin levels were low> now  high.   ?Cleviprex at 3 ?Remains on cLTM ?C/o of sharp circumferential headache- tylenol did not help, no nausea, feels sleepy ? ?Objective:  ?Blood pressure (!) 157/102, pulse 100, temperature (!) 97.4 ?F (36.3 ?C), temperature source Oral, resp. rate (!) 27, height 5\' 9"  (1.753 m), weight 96.9 kg, SpO2 97 %. ?   ?   ? ?Intake/Output Summary (Last 24 hours) at 08/26/2021 0821 ?Last data filed at 08/26/2021 0700 ?Gross per 24 hour  ?Intake 991.33 ml  ?Output 1035 ml  ?Net -43.67 ml  ? ?Filed Weights  ? 08/24/21 1100 08/25/21 0500  ?Weight: 117.9 kg 96.9 kg  ? ? ?Examination: ?General:  Adult male lying in bed in NAD ?HEENT: MM pink/moist, R pupil 3/sluggish, L pupil 4/sluggish (unchanged per RN), no facial deficits, soft speech, remains on cEEG ?Neuro: sleepy, but able to maintain conversation, oriented to person, place, year, no event, MAE- no appreciated weakness ?CV: rr, NSR, no murmur ?PULM:  non labored, CTA, room air ?GI: soft, bs+, ND, male purwick ?Extremities: warm/dry, no LE edema  ?Skin: no rashes ? ? ?Labs/imaging personally reviewed:  ?CT head 5/11 > negative. ?MRI brain 5/11 > subacute ischemic infarct ? ?Assessment & Plan:  ? ?Acute hypertensive encephalopathy ?Hypertensive Emergency ?HTN ?Patient presented with severely elevated BP along with signs of end-organ damage (AMS, AKI). BP better controlled now with IV antihypertensives. Ischemic infarct noted on MRI appears to be subacute so less likely to have caused patient's abrupt presentation. Brain imaging not consistent with PRES.  Encephalopathy should slowly continue to resolve adequate BP control and as sedative medications completely wear off. ?-  home coreg 12.5mg  BID, diltiazem 180mg  daily, lasix 20mg , hydralazine 50mg  TID ?P:  ?- Neurology following, appreciate recs ?- cEEG> no seizures appreciated thus far ?- continue seizure precautions.  Maintain neuro protective measures; goal for eurothermia, euglycemia, eunatermia, normoxia, and PCO2 goal  of 35-40 ?- passed swallow screen and improving neuro, will start clear liquids and advance as tolerated  ?-  continue SBP goal 150-170 on cleviprex.  Will add back hydralazine, continue to hold coreg and dilt to avoid lowering BP too much  ?- minimize sedatives.  Stop ativan, does not appear agitated anymore.  ?- tylenol not helped headache.  Appears to be on fioricet / compazine at home.  Last received tylenol around 0600 which did not help.  Continue strict BP control and try low dose oxy IR 2.5mg  x 1  ? ?Subacute Ischemic Infarct ?Hx of TIA ? (Feb 2023, MRI imaging during admission showing mid-R frontal lobe infarct) ?Unclear etiology. Possibly related to underlying Afib and suspected medication non-compliance.  ?- neuro following, appreciate assistance ?- resuming Eliquis, no need for further antiplatelets per neuro ?- continue statin and recheck lipid panel  ? ?Atrial Fibrillation, on eliquis at home ?Unclear if patient is fully compliant with eliquis, family reports that he takes most doses (misses ~3-4 doses/week). Patient may only be partially anticoagulated, raising suspicion for AF-induced embolic infarct.  ?- remains in NSR ?- now awake and tolerating orals, change heparin gtt to Eliquis  ? ?AKI on CKD IIIB secondary to hypertensive emergency ?- renal function and UOP stable ?- continue hemodynamic support ?- strict I/Os, trend renal indices  ?- avoid nephrotoxic medications ? ?T2DM with hyperglycemia ?- recheck A1c ?- restarting clear liquids ?- lower BG this morning, continue to hold levemir and continue SSI moderate ? ?Best practice (evaluated daily):  ?Diet/type: clear liquids, advance as tolerated/ mental status allows  ?DVT prophylaxis: systemic heparin  ?GI prophylaxis: N/A ?Lines: N/A ?Foley:  N/A ?Code Status:  full code ?Last date of multidisciplinary goals of care discussion:  ? ?Patient and his mother at bedside updated.  ? ? ?Critical care time: 35 min.  ? ? ?Kennieth Rad, ACNP ?[facility] ?08/26/2021, 8:21 AM ? ?See Amion for pager ?If no response to pager, please call PCCM consult pager ?After 7:00 pm call Elink   ? ? ?

## 2021-08-26 NOTE — Progress Notes (Signed)
ANTICOAGULATION CONSULT NOTE - Follow Up Consult ? ?Pharmacy Consult for Heparin ?Indication: atrial fibrillation ? ?Allergies  ?Allergen Reactions  ? Morphine Itching  ? ? ?Patient Measurements: ?Height: 5\' 9"  (175.3 cm) ?Weight: 96.9 kg (213 lb 10 oz) ?IBW/kg (Calculated) : 70.7 ?Heparin Dosing Weight: 97 kg ? ?Vital Signs: ?Temp: 97.4 ?F (36.3 ?C) (05/13 0346) ?Temp Source: Oral (05/13 0346) ?BP: 141/86 (05/13 0600) ?Pulse Rate: 97 (05/13 0600) ? ?Labs: ?Recent Labs  ?  08/24/21 ?1127 08/24/21 ?1203 08/24/21 ?1204 08/24/21 ?1648 08/25/21 ?0455 08/25/21 ?0455 08/25/21 ?2876 08/25/21 ?1541 08/26/21 ?8115  ?HGB 15.0 15.3 15.0  --  13.5  --   --   --   --   ?HCT 44.7 45.0 44.0  --  39.0  --   --   --   --   ?PLT 309  --   --   --  136*  --   --   --   --   ?APTT  --   --   --   --   --   --  26  --   --   ?HEPARINUNFRC  --   --   --   --  <0.10*   < > <0.10* <0.10* >1.10*  ?CREATININE 2.84*  --  3.00*  --  2.75*  --   --   --  2.78*  ?TROPONINIHS 69*  --   --  42*  --   --   --   --   --   ? < > = values in this interval not displayed.  ? ? ? ?Estimated Creatinine Clearance: 39.8 mL/min (A) (by C-G formula based on SCr of 2.78 mg/dL (H)). ? ? ?Medications:  ?Infusions:  ? clevidipine 5 mg/hr (08/26/21 0600)  ? heparin 2,100 Units/hr (08/26/21 0600)  ? ? ?Assessment: ?41 years of age male admitted with acute encephalopathy and hypertensive crisis. Pharmacy consulted to start IV Heparin for atrial fibrillation. Of note, patient has been on Eliquis in past but was unable to verify if he is still on medication.  ? ?Initially a concern that labs were incorrect overnight due to clotting - repeated.  ?Heparin level undetectable x2 draws. aPTT also low at 26 after starting Heparin at 1500 units/hr. Infusion increased and still undetectable. New line started last night and now heparin level above goal = >1.10 - No  overt s/sx of bleeding  ? ?Goal of Therapy:  ?Heparin level 0.3-0.7 units/ml ?Monitor platelets by  anticoagulation protocol: Yes ?  ?Plan:  ?Hold heparin x1 hour (resume infusion @0800 ) ?Decrease heparin gtt to 1800 units/hr.  ?Heparin level in 8 hours.  ?Daily Heparin level and CBC.  ? ?Georga Bora, PharmD ?Clinical Pharmacist ?08/26/2021 6:56 AM ?Please check AMION for all [place] numbers ? ? ? ? ? ? ?

## 2021-08-26 NOTE — Procedures (Addendum)
Patient Name: Victor Bryan  ?MRN: [id number]  ?Epilepsy Attending: Lora Havens  ?Referring Physician/Provider: Julian Hy, DO ?Duration: 08/25/2021 1830 to 08/26/2021 1236 ?  ?Patient history: 41yo M patient presents with acute onset altered mental status. EEG to evaluate for seizure ?  ?Level of alertness: lethargic  ?  ?AEDs during EEG study: Ativan ?  ?Technical aspects: This EEG study was done with scalp electrodes positioned according to the 10-20 International system of electrode placement. Electrical activity was acquired at a sampling rate of 500Hz  and reviewed with a high frequency filter of 70Hz  and a low frequency filter of 1Hz . EEG data were recorded continuously and digitally stored.  ?  ?Description: EEG showed continuous generalized 2-3Hz  delta slowing admixed with 13-15Hz  generalized beta activity. Hyperventilation and photic stimulation were not performed.  ?  ?EKG artifact was seen during the study.  ?    ?ABNORMALITY ?- Continuous slow, generalized ?  ?IMPRESSION: ?This study is suggestive of moderate to severe diffuse encephalopathy, nonspecific etiology. No seizures or epileptiform discharges were seen throughout the recording. ?  ?Lora Havens  ?

## 2021-08-26 NOTE — Progress Notes (Incomplete)
LTM EEG discontinued - no skin breakdown at unhook.   

## 2021-08-26 NOTE — Progress Notes (Signed)
ANTICOAGULATION CONSULT NOTE - Follow Up Consult ? ?Pharmacy Consult for Heparin>>apixaban ?Indication: atrial fibrillation ? ?Allergies  ?Allergen Reactions  ? Morphine Itching  ? ? ?Patient Measurements: ?Height: 5\' 9"  (175.3 cm) ?Weight: 96.9 kg (213 lb 10 oz) ?IBW/kg (Calculated) : 70.7 ?Heparin Dosing Weight: 97 kg ? ?Vital Signs: ?Temp: 97.4 ?F (36.3 ?C) (05/13 0346) ?Temp Source: Oral (05/13 0818) ?BP: 162/108 (05/13 0930) ?Pulse Rate: 98 (05/13 0930) ? ?Labs: ?Recent Labs  ?  08/24/21 ?1127 08/24/21 ?1203 08/24/21 ?1204 08/24/21 ?1648 08/25/21 ?0455 08/25/21 ?0455 08/25/21 ?2197 08/25/21 ?1541 08/26/21 ?5883  ?HGB 15.0 15.3 15.0  --  13.5  --   --   --   --   ?HCT 44.7 45.0 44.0  --  39.0  --   --   --   --   ?PLT 309  --   --   --  136*  --   --   --   --   ?APTT  --   --   --   --   --   --  26  --   --   ?HEPARINUNFRC  --   --   --   --  <0.10*   < > <0.10* <0.10* >1.10*  ?CREATININE 2.84*  --  3.00*  --  2.75*  --   --   --  2.78*  ?TROPONINIHS 69*  --   --  42*  --   --   --   --   --   ? < > = values in this interval not displayed.  ? ? ? ?Estimated Creatinine Clearance: 39.8 mL/min (A) (by C-G formula based on SCr of 2.78 mg/dL (H)). ? ? ?Medications:  ?Infusions:  ? clevidipine 4 mg/hr (08/26/21 0900)  ? ? ?Assessment: ?41 years of age male admitted with acute encephalopathy and hypertensive crisis. Pharmacy consulted to start IV Heparin for atrial fibrillation. Of note, patient has been on Eliquis in past but was unable to verify if he is still on medication.  ? ? ?Goal of Therapy:  ?Heparin level 0.3-0.7 units/ml ?Monitor platelets by anticoagulation protocol: Yes ?  ?Plan:  ? ?Transitioning patient from heparin drip to apixaban 5mg  BID (PTA) ?Monitor for signs/symptoms of bleeding ? ?Thank you for allowing pharmacy to be a part of this patient?s care. ? ?Donnald Garre, PharmD ?Clinical Pharmacist ? ?Please check AMION for all [place] numbers ?After 10:00 PM, call [place]  [phone] ? ? ? ? ? ?

## 2021-08-26 NOTE — Progress Notes (Signed)
Subjective: ? ?Victor Bryan is a 41 y.o. male with PMH significant for  has a past medical history of Atrial fibrillation ([place]), Diabetes mellitus without complication ([place]), Hyperlipidemia, Hypertension, and Migraine. who presents with altered mental status in the setting of HTN emergency.  ? ?Patient is napping today when I enter the room, when aroused is fully oriented but drowsy. RN states that he seemed back to baseline mental status this AM.  ? ?Exam: ?Vitals:  ? 08/26/21 1745 08/26/21 1800  ?BP: (!) 143/101 126/84  ?Pulse: 100 95  ?Resp: 19 19  ?Temp:    ?SpO2: 98% 98%  ? ?Gen: asleep but arousable, fully oriented, follows commands ?Resp: non-labored breathing, no acute distress ?Abd: soft, nt ? ?Neuro: ?*MS: asleep but arousable, fully oriented, follows commands ?*Speech: no dysarthria or aphasia, able to name and repeat. ?*CN: PERRL, VFF by confrontation, EOMI, sensation intact, face symmetric at rest, hearing intact to voice ?*Motor: 5/5 strength throughout ?*Sensory: SILT ?*Reflexes: 2+ symm throughout toes down ? ? ?Pertinent Labs: ? ?  Latest Ref Rng & Units 08/25/2021  ?  4:55 AM 08/24/2021  ? 12:04 PM 08/24/2021  ? 12:03 PM  ?CBC  ?WBC 4.0 - 10.5 K/uL 11.3      ?Hemoglobin 13.0 - 17.0 g/dL 13.5   15.0   15.3    ?Hematocrit 39.0 - 52.0 % 39.0   44.0   45.0    ?Platelets 150 - 400 K/uL 136      ? ? ?  Latest Ref Rng & Units 08/26/2021  ?  6:05 AM 08/25/2021  ?  4:55 AM 08/24/2021  ? 12:04 PM  ?CMP  ?Glucose 70 - 99 mg/dL 85   110   187    ?BUN 6 - 20 mg/dL 29   23   23     ?Creatinine 0.61 - 1.24 mg/dL 2.78   2.75   3.00    ?Sodium 135 - 145 mmol/L 137   138   140    ?Potassium 3.5 - 5.1 mmol/L 3.6   4.6   3.5    ?Chloride 98 - 111 mmol/L 109   106   105    ?CO2 22 - 32 mmol/L 22   22     ?Calcium 8.9 - 10.3 mg/dL 8.3   8.7     ? ? ?MRI HEAD IMPRESSION: ?  ?1. Motion degraded exam. ?2. 11 mm focus of mild diffusion abnormality involving the right ?paramedian splenium, most consistent with an evolving  subacute ?ischemic infarct. No associated hemorrhage or mass effect. ?3. Mild cerebral white matter disease, nonspecific, but most ?commonly related to chronic microvascular ischemic disease, stable. ?  ?MRA HEAD IMPRESSION: ?  ?Negative intracranial MRA. No large vessel occlusion, ?hemodynamically significant stenosis, or other acute vascular ?abnormality. ?  ?Carotid Dopplers: pending  ? ?Impression:  ? ?Acute encephalopathy secondary to hypertensive emergency: ?Patient's encephalopathy has improved with BP control and he is fully oriented on my examination today despite being drowsy after being woken up for a nap. He has no focal deficits.  He had no electrographic seizures on EEG and this was DC'd yesterday.  MRI brain on admission showed an 11 mm focus of mild diffusion abnormality in the right paramedian splenium which on my review appears to be new since his last known infarct in February 2023 but there is no clear ADC correlate and I am not convinced that this is subacute.  It was certainly not acute and is felt to be  unrelated to his presentation with encephalopathy.  The encephalopathy is favored to be secondary to hypertensive emergency and has improved significantly with blood pressure control.  He should be counseled on the importance of adherence to medications for secondary stroke prevention including Eliquis and his antihypertensive medications.  Neurology to sign off however if there are any new neurologic concerns please reengage. ? ?He had a stroke in February 2023 however I do not see any neurology outpatient follow-up in our system therefore I will refer to outpatient neurology. ? ?Su Monks, MD ?[facility] ?[phone] ? ?If 7pm- 7am, please page neurology on call as listed in [place]. ? ?

## 2021-08-27 DIAGNOSIS — I161 Hypertensive emergency: Secondary | ICD-10-CM | POA: Diagnosis not present

## 2021-08-27 DIAGNOSIS — I674 Hypertensive encephalopathy: Secondary | ICD-10-CM | POA: Diagnosis not present

## 2021-08-27 LAB — GLUCOSE, CAPILLARY
Glucose-Capillary: 154 mg/dL — ABNORMAL HIGH (ref 70–99)
Glucose-Capillary: 106 mg/dL — ABNORMAL HIGH (ref 70–99)
Glucose-Capillary: 122 mg/dL — ABNORMAL HIGH (ref 70–99)
Glucose-Capillary: 172 mg/dL — ABNORMAL HIGH (ref 70–99)
Glucose-Capillary: 156 mg/dL — ABNORMAL HIGH (ref 70–99)

## 2021-08-27 LAB — CBC
HCT: 38.7 % — ABNORMAL LOW (ref 39.0–52.0)
Hemoglobin: 13.4 g/dL (ref 13.0–17.0)
MCH: 26.6 pg (ref 26.0–34.0)
MCHC: 34.6 g/dL (ref 30.0–36.0)
MCV: 76.8 fL — ABNORMAL LOW (ref 80.0–100.0)
Platelets: 268 10*3/uL (ref 150–400)
RBC: 5.04 MIL/uL (ref 4.22–5.81)
RDW: 14.5 % (ref 11.5–15.5)
WBC: 9 10*3/uL (ref 4.0–10.5)
nRBC: 0 % (ref 0.0–0.2)

## 2021-08-27 LAB — BASIC METABOLIC PANEL
Anion gap: 6 (ref 5–15)
BUN: 22 mg/dL — ABNORMAL HIGH (ref 6–20)
CO2: 23 mmol/L (ref 22–32)
Calcium: 8 mg/dL — ABNORMAL LOW (ref 8.9–10.3)
Chloride: 108 mmol/L (ref 98–111)
Creatinine, Ser: 2.48 mg/dL — ABNORMAL HIGH (ref 0.61–1.24)
GFR, Estimated: 32 mL/min — ABNORMAL LOW (ref >60)
Glucose, Bld: 125 mg/dL — ABNORMAL HIGH (ref 70–99)
Potassium: 3.4 mmol/L — ABNORMAL LOW (ref 3.5–5.1)
Sodium: 137 mmol/L (ref 135–145)

## 2021-08-27 LAB — LIPID PANEL
Cholesterol: 234 mg/dL — ABNORMAL HIGH (ref 0–200)
HDL: 43 mg/dL (ref >40)
LDL Cholesterol: 169 mg/dL — ABNORMAL HIGH (ref 0–99)
Total CHOL/HDL Ratio: 5.4 RATIO
Triglycerides: 110 mg/dL (ref <150)
VLDL: 22 mg/dL (ref 0–40)

## 2021-08-27 LAB — HEMOGLOBIN A1C
Hgb A1c MFr Bld: 7.2 % — ABNORMAL HIGH (ref 4.8–5.6)
Mean Plasma Glucose: 159.94 mg/dL

## 2021-08-27 MED ORDER — INSULIN ASPART 100 UNIT/ML IJ SOLN
0.0000 [IU] | Freq: Three times a day (TID) | INTRAMUSCULAR | Status: DC
  Administered 2021-08-27: 1 [IU] via SUBCUTANEOUS
  Administered 2021-08-27: 2 [IU] via SUBCUTANEOUS
  Administered 2021-08-28 (×3): 1 [IU] via SUBCUTANEOUS
  Administered 2021-08-29: 2 [IU] via SUBCUTANEOUS
  Administered 2021-08-30: 1 [IU] via SUBCUTANEOUS
  Administered 2021-08-30 (×2): 2 [IU] via SUBCUTANEOUS
  Administered 2021-08-31: 3 [IU] via SUBCUTANEOUS
  Administered 2021-08-31: 2 [IU] via SUBCUTANEOUS

## 2021-08-27 MED ORDER — POTASSIUM CHLORIDE CRYS ER 20 MEQ PO TBCR
40.0000 meq | EXTENDED_RELEASE_TABLET | Freq: Once | ORAL | Status: AC
  Administered 2021-08-27: 40 meq via ORAL
  Filled 2021-08-27: qty 2

## 2021-08-27 MED ORDER — ATORVASTATIN CALCIUM 40 MG PO TABS
40.0000 mg | ORAL_TABLET | Freq: Every day | ORAL | Status: DC
Start: 2021-08-27 — End: 2021-08-31
  Administered 2021-08-27 – 2021-08-31 (×5): 40 mg via ORAL
  Filled 2021-08-27 (×5): qty 1

## 2021-08-27 MED ORDER — INSULIN ASPART 100 UNIT/ML IJ SOLN: 0.0000 [IU] | Freq: Every day | INTRAMUSCULAR | Status: DC

## 2021-08-27 MED ORDER — INSULIN DETEMIR 100 UNIT/ML ~~LOC~~ SOLN
10.0000 [IU] | Freq: Every day | SUBCUTANEOUS | Status: DC
  Administered 2021-08-28 – 2021-08-30 (×4): 10 [IU] via SUBCUTANEOUS
  Filled 2021-08-27 (×6): qty 0.1

## 2021-08-27 MED ORDER — DILTIAZEM HCL 60 MG PO TABS
60.0000 mg | ORAL_TABLET | Freq: Three times a day (TID) | ORAL | Status: DC
  Administered 2021-08-27 – 2021-08-31 (×14): 60 mg via ORAL
  Filled 2021-08-27 (×2): qty 1
  Filled 2021-08-27: qty 2
  Filled 2021-08-27 (×8): qty 1
  Filled 2021-08-27: qty 2
  Filled 2021-08-27 (×2): qty 1

## 2021-08-27 MED ORDER — CITALOPRAM HYDROBROMIDE 40 MG PO TABS
40.0000 mg | ORAL_TABLET | Freq: Every day | ORAL | Status: DC
  Administered 2021-08-27 – 2021-08-31 (×5): 40 mg via ORAL
  Filled 2021-08-27 (×5): qty 1

## 2021-08-27 NOTE — Progress Notes (Addendum)
? ?NAME:  Victor Bryan, MRN:  [id number], DOB:  1980/06/19, LOS: 3 ?ADMISSION DATE:  08/24/2021, CONSULTATION DATE:  08/24/21 ?REFERRING MD:  Roslynn Amble CHIEF COMPLAINT:  AMS, agitation, HTN  ? ?History of Present Illness:  ?Victor Bryan is a 41 y.o. male who has a PMH as below including but not limited to DM, HTN, A.Fib, CKD. He presented to [facility] ED 5/11 from his work with [employer]. He apparently noticed that he was getting altered on his own and told coworkers who then called EMS.  On EMS arrival, he became combative and was not following any instructions/directions.  ?On ED arrival, he remained altered and combative despite 2mg  Ativan. He then received 20mg  Geodon with improvement in combativeness; however, SBP remained in the 200's.  He was given 10mg  Labetalol x 2 as well as 20mg  Hydralazine with no improvement. He was subsequently started on Cleviprex infusion. CT head was negative for any acute process. Highest documented SBP was 234 at 1300. ? ?Pt's fiance informed ED staff that pt had been in his USOH earlier that morning.  He had complained of headache for the last few days but otherwise was acting normally and had no other complaints. ? ?Pertinent  Medical History:  ?has Stroke [facility]); AKI (acute kidney injury) ([place]); Elevated troponin; Weakness; Paroxysmal atrial fibrillation ([place]); HTN (hypertension); Type 2 diabetes mellitus with complication, with long-term current use of insulin ([place]); OSA (obstructive sleep apnea); Hypertensive urgency; Hypertensive emergency; Deviation of downward gaze; and Hypertensive encephalopathy on their problem list. ? ?Significant Hospital Events: ?Including procedures, antibiotic start and stop dates in addition to other pertinent events   ?5/11 admit, MRI with new subacute ischemic infarct ?5/12 agitated overnight, requiring ativan, remains on cleviprex, SBP goal 150-170 ?5/13 off cleviprex, LTM d/c'd> neg, heparin gtt> eliquis, neurology signed off  ? ?Interim History /  Subjective:  ?Still c/o of headache and feeling "woozy" at times, some blurred vision earlier now resolved.  Frequently ask "am I going to dye?" ?Remains off cleviprex with SBP 140- 180s ? ?Objective:  ?Blood pressure (!) 154/99, pulse (!) 102, temperature (!) 97 ?F (36.1 ?C), temperature source Axillary, resp. rate (!) 22, height 5\' 9"  (1.753 m), weight 96.9 kg, SpO2 98 %. ?   ?   ? ?Intake/Output Summary (Last 24 hours) at 08/27/2021 0839 ?Last data filed at 08/27/2021 0800 ?Gross per 24 hour  ?Intake 450.01 ml  ?Output 1975 ml  ?Net -1524.99 ml  ? ?Filed Weights  ? 08/24/21 1100 08/25/21 0500  ?Weight: 117.9 kg 96.9 kg  ? ?Examination: ?General:  Adult male lying in bed in NAD ?HEENT: MM pink/moist, pupils L 4/sluggish, R 3/ sluggish, no facial asymmetry, speech soft and clear  ?Neuro:  resting, easily awakens, oriented (except thinks its April), MAE- no focal weakness ?CV: rr, NSR, no murmur ?PULM:  non labored, CTA, room air ?GI: soft, bs+, NT, voids  ?Extremities: warm/dry, no LE edema  ?Skin: no rashes  ? ?Labs: K 3.4, sCr 2.78> 2.48, cholesterol 234, LDL 169, MCV 76.8 ?BG: 106> 154 ? ?Labs/imaging personally reviewed:  ?CT head 5/11 > negative. ?MRI brain 5/11 > subacute ischemic infarct ? ?Assessment & Plan:  ? ?Acute hypertensive encephalopathy ?Hypertensive Emergency ?HTN ?Patient presented with severely elevated BP along with signs of end-organ damage (AMS, AKI). Placed on cardene for SBP goal 150-170, off 5/13.  Ischemic infarct noted on MRI appears to be subacute so less likely to have caused patient's abrupt presentation. Brain imaging not consistent with PRES.  Encephalopathy  should continue to slowly continue to resolve adequate BP control and as sedative medications completely wear off.   ?- home coreg 12.5mg  BID, diltiazem 180mg  daily, lasix 20mg , hydralazine 50mg  TID ?P:  ?- Neurology signed off 5/13.  cLTM stopped 5/13> no evidence of seizures  ?- continue BP control.  Cont hydralazine, coreg, and  adding back diltiazem (60mg  TID instead of CR), holding lasix as net -2.2L  ?- serial neuro exams  ?- headache 2/2 to hypertension will take awhile to resolve.  Continue tylenol prn and prn oxy ?- PT consult to improve mobility  ?- fall precautions ?- long discussion held with patient about medication compliance.  Patient has anxiety about events.  Restarting home celexa.  Qtc 44 ? ?Subacute Ischemic Infarct ?Hx of TIA ? (Feb 2023, MRI imaging during admission showing mid-R frontal lobe infarct) ?HLD ?Suspected relaying to underlying Afib, suspected medication non-compliance, and uncontrolled hypertension ?- neuro following, appreciate assistance ?- continue eliquis and changing to high intensity statin, lipitor> cholesterol 234, LDL 169 ? ?Atrial Fibrillation, on eliquis at home ?Unclear if patient is fully compliant with eliquis, family reports that he takes most doses (misses ~3-4 doses/week). Patient may only be partially anticoagulated, raising suspicion for AF-induced embolic infarct.  ?- remains in NSR ?- continue eliquis  ? 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 ?T2DM with hyperglycemia ?- recheck A1c 7.2 ?- restart lower home dose levemir 10 units and sensitive SSI w/meals  ? ?Best practice (evaluated daily):  ?Diet/type: Regular consistency (see orders)- heart healthy/ carb modified ?DVT prophylaxis: DOAC  ?GI prophylaxis: N/A ?Lines: N/A ?Foley:  N/A ?Code Status:  full code ?Last date of multidisciplinary goals of care discussion:  ? ?Patient updated on plan of care.  He is hemodynamically and neurologically stable to transfer to telemetry floor.   ? ? ?Critical care time: n/a  ? ? ?Victor Bryan, ACNP ?[facility] ?08/27/2021, 8:39 AM ? ?See Amion for pager ?If no response to pager, please call PCCM consult pager ?After  7:00 pm call [place]   ? ? ?

## 2021-08-28 DIAGNOSIS — I161 Hypertensive emergency: Secondary | ICD-10-CM | POA: Diagnosis not present

## 2021-08-28 DIAGNOSIS — I674 Hypertensive encephalopathy: Secondary | ICD-10-CM | POA: Diagnosis not present

## 2021-08-28 LAB — CBC
HCT: 38.7 % — ABNORMAL LOW (ref 39.0–52.0)
Hemoglobin: 13.3 g/dL (ref 13.0–17.0)
MCH: 26.3 pg (ref 26.0–34.0)
MCHC: 34.4 g/dL (ref 30.0–36.0)
MCV: 76.6 fL — ABNORMAL LOW (ref 80.0–100.0)
Platelets: 286 10*3/uL (ref 150–400)
RBC: 5.05 MIL/uL (ref 4.22–5.81)
RDW: 14.3 % (ref 11.5–15.5)
WBC: 7.9 10*3/uL (ref 4.0–10.5)
nRBC: 0 % (ref 0.0–0.2)

## 2021-08-28 LAB — BASIC METABOLIC PANEL
Anion gap: 7 (ref 5–15)
BUN: 21 mg/dL — ABNORMAL HIGH (ref 6–20)
CO2: 22 mmol/L (ref 22–32)
Calcium: 8.1 mg/dL — ABNORMAL LOW (ref 8.9–10.3)
Chloride: 107 mmol/L (ref 98–111)
Creatinine, Ser: 2.47 mg/dL — ABNORMAL HIGH (ref 0.61–1.24)
GFR, Estimated: 33 mL/min — ABNORMAL LOW (ref >60)
Glucose, Bld: 191 mg/dL — ABNORMAL HIGH (ref 70–99)
Potassium: 3.7 mmol/L (ref 3.5–5.1)
Sodium: 136 mmol/L (ref 135–145)

## 2021-08-28 LAB — GLUCOSE, CAPILLARY
Glucose-Capillary: 136 mg/dL — ABNORMAL HIGH (ref 70–99)
Glucose-Capillary: 129 mg/dL — ABNORMAL HIGH (ref 70–99)
Glucose-Capillary: 136 mg/dL — ABNORMAL HIGH (ref 70–99)
Glucose-Capillary: 170 mg/dL — ABNORMAL HIGH (ref 70–99)

## 2021-08-28 MED ORDER — HYDRALAZINE HCL 50 MG PO TABS: 100.0000 mg | ORAL_TABLET | Freq: Three times a day (TID) | ORAL | Status: DC

## 2021-08-28 MED ORDER — ONDANSETRON 4 MG PO TBDP
4.0000 mg | ORAL_TABLET | Freq: Once | ORAL | Status: DC | PRN
Start: 2021-08-28 — End: 2021-08-31

## 2021-08-28 MED ORDER — BUTALBITAL-APAP-CAFFEINE 50-325-40 MG PO TABS
1.0000 | ORAL_TABLET | Freq: Three times a day (TID) | ORAL | Status: DC | PRN
  Administered 2021-08-28: 1 via ORAL
  Filled 2021-08-28 (×2): qty 1

## 2021-08-28 MED ORDER — HYDRALAZINE HCL 50 MG PO TABS
75.0000 mg | ORAL_TABLET | Freq: Three times a day (TID) | ORAL | Status: DC
  Administered 2021-08-28 – 2021-08-29 (×3): 75 mg via ORAL
  Filled 2021-08-28 (×3): qty 1

## 2021-08-28 MED ORDER — LORATADINE 10 MG PO TABS
10.0000 mg | ORAL_TABLET | Freq: Every day | ORAL | Status: DC
  Administered 2021-08-28 – 2021-08-31 (×4): 10 mg via ORAL
  Filled 2021-08-28 (×4): qty 1

## 2021-08-28 MED ORDER — FLUTICASONE PROPIONATE 50 MCG/ACT NA SUSP
1.0000 | Freq: Every day | NASAL | Status: DC
  Administered 2021-08-28 – 2021-08-30 (×3): 1 via NASAL
  Filled 2021-08-28: qty 16

## 2021-08-28 MED ORDER — MELATONIN 3 MG PO TABS
3.0000 mg | ORAL_TABLET | Freq: Every evening | ORAL | Status: DC | PRN
  Administered 2021-08-28 (×2): 3 mg via ORAL
  Filled 2021-08-28 (×2): qty 1

## 2021-08-28 NOTE — TOC Initial Note (Signed)
Transition of Care (TOC) - Initial/Assessment Note  ? ? ?Patient Details  ?Name: Victor Bryan ?MRN: [id number] ?Date of Birth: 07-02-1980 ? ?Transition of Care (TOC) CM/SW Contact:    ?[name], [name], RN ?Phone Number: ?08/28/2021, 4:51 PM ? ?Clinical Narrative:  Risk for readmission assessment completed. Case Manager did speak with the patient and he is agreeable to home health services- no preference on agency. Case Manager called [facility] and they can service the patient for home health PT- Orders will need to be maintained. Case Manager will discuss PCP needs with the patient on tomorrow. Shower chair ordered via [facility] and will be delivered to the room prior to discharge. No further needs from Case Manager at this time.            ? ? ?Expected Discharge Plan: [place] ?Barriers to Discharge: No Barriers Identified ? ? ?Patient Goals and CMS Choice ?Patient states their goals for this hospitalization and ongoing recovery are:: to return home with home health services. ?  ?Choice offered to / list presented to : Patient ? ?Expected Discharge Plan and Services ?Expected Discharge Plan: [place] ?In-house Referral: NA ?Discharge Planning Services: CM Consult ?Post Acute Care Choice: Home Health ?  ?                ?DME Arranged: Shower stool ?DME Agency: [facility] ?Date DME Agency Contacted: 08/28/21 ?Time DME Agency Contacted: 1640 ?Representative spoke with at DME Agency: Mardene Celeste ?HH Arranged: PT ?[place] Agency: [place]) ?Date HH Agency Contacted: 08/28/21 ?Time [place]: [phone] ?Representative spoke with at [place]: Corene Cornea ? ?Prior Living Arrangements/Services ?  ?Lives with:: Significant Other ?Patient language and need for interpreter reviewed:: Yes ?Do you feel safe going back to the place where you live?: Yes      ?Need for Family Participation in Patient Care: Yes (Comment) ?Care giver support system in place?: Yes  (comment) ?  ?Criminal Activity/Legal Involvement Pertinent to Current Situation/Hospitalization: No - Comment as needed ? ?Activities of Daily Living ?  ?  ? ?Permission Sought/Granted ?Permission sought to share information with : Facility Sport and exercise psychologist, Tourist information centre manager, Family Supports ?Permission granted to share information with : Yes, Verbal Permission Granted ?   ? Permission granted to share info w AGENCY: [facility], and [facility] ?   ?   ? ?Emotional Assessment ?Appearance:: Appears stated age ?Attitude/Demeanor/Rapport: Engaged ?Affect (typically observed): Appropriate ?Orientation: : Oriented to Place, Oriented to  Time, Oriented to Self, Oriented to Situation ?Alcohol / Substance Use: Not Applicable ?Psych Involvement: No (comment) ? ?Admission diagnosis:  Hypertensive urgency [I16.0] ?Hypertensive emergency [I16.1] ?Patient Active Problem List  ? Diagnosis Date Noted  ? Hypertensive encephalopathy   ? Hypertensive urgency 08/24/2021  ? Hypertensive emergency   ? Deviation of downward gaze   ? AKI (acute kidney injury) ([place]) 05/31/2021  ? Elevated troponin 05/31/2021  ? Weakness 05/31/2021  ? Paroxysmal atrial fibrillation ([place]) 05/31/2021  ? HTN (hypertension) 05/31/2021  ? Type 2 diabetes mellitus with complication, with long-term current use of insulin ([place]) 05/31/2021  ? OSA (obstructive sleep apnea) 05/31/2021  ? Stroke ([place]) 05/30/2021  ? ?PCP:  Pcp, No ?Pharmacy:   ?[facility] [address] ?[place] [address] ?Phone: [phone] Fax: [phone] ? ?Readmission Risk Interventions ? ?  08/28/2021  ?  4:43 PM  ?Readmission Risk  Prevention Plan  ?Transportation Screening Complete  ?PCP or Specialist Appt within 3-5 Days Complete  ?[place] or Home Care Consult Complete  ?Social Work Consult for [place] Planning/Counseling Complete  ?Palliative Care Screening Not Applicable  ?Medication Review  Press photographer) Complete  ? ? ? ?

## 2021-08-28 NOTE — Evaluation (Signed)
Occupational Therapy Evaluation Patient Details Name: Victor Bryan MRN: [id number] DOB: [birth year] Today's Date: 08/28/2021   History of Present Illness The pt is a 41 yo male presenting 5/11 with AMS, found to have HTN with SBP in 200s. MRI showed 11mm abnormality of R paramedian splenium, most consistent with evolving subacute ischemic infarct. PMH includes: R frontal CVA, afib, HLD, HTN, DM II, and OSA.   Clinical Impression   Pt currently min guard for most selfcare tasks sit to stand with min assist for functional transfers without the use of an assistive device.  Slight LOB to the right noted with initial mobility.  Slight generalized weakness in the LUE compared to the right, more distal than proximal.  Recommend acute OT level therapy to help increase overall independence back to at least a modified independent level for home.       Recommendations for follow up therapy are one component of a multi-disciplinary discharge planning process, led by the attending physician.  Recommendations may be updated based on patient status, additional functional criteria and insurance authorization.   Follow Up Recommendations  No OT follow up    Assistance Recommended at Discharge PRN  Patient can return home with the following A little help with bathing/dressing/bathroom;A little help with walking and/or transfers;Assistance with cooking/housework;Assist for transportation    Functional Status Assessment  Patient has had a recent decline in their functional status and demonstrates the ability to make significant improvements in function in a reasonable and predictable amount of time.  Equipment Recommendations  None recommended by OT       Precautions / Restrictions Precautions Precautions: Fall Precaution Comments: Monitor BP Restrictions Weight Bearing Restrictions: No      Mobility Bed Mobility Overal bed mobility: Needs Assistance Bed Mobility: Supine to Sit     Supine to  sit: Supervision          Transfers Overall transfer level: Needs assistance Equipment used: None Transfers: Sit to/from Stand, Bed to chair/wheelchair/BSC Sit to Stand: Min guard     Step pivot transfers: Min assist     General transfer comment: LOB to the right with initial stepping when completing toilet transfer.      Balance Overall balance assessment: Needs assistance Sitting-balance support: No upper extremity supported, Feet supported Sitting balance-Leahy Scale: Good     Standing balance support: No upper extremity supported Standing balance-Leahy Scale: Fair Standing balance comment: LOB to the right when attempting to ambulate to the bathroom without an assistive device.                           ADL either performed or assessed with clinical judgement   ADL Overall ADL's : Needs assistance/impaired Eating/Feeding: Independent;Sitting   Grooming: Wash/dry hands;Min guard;Standing   Upper Body Bathing: Set up;Sitting Upper Body Bathing Details (indicate cue type and reason): simulated Lower Body Bathing: Min guard;Sit to/from stand   Upper Body Dressing : Set up;Sitting   Lower Body Dressing: Sit to/from stand;Min guard   Toilet Transfer: Minimal assistance;Ambulation;Regular Teacher, adult education Details (indicate cue type and reason): no device Toileting- Clothing Manipulation and Hygiene: Sit to/from stand;Minimal assistance       Functional mobility during ADLs: Minimal assistance (No device) General ADL Comments: Pt with elevated BP in supine with HOB up to approximately 45 degrees. 175/114 and 133/101.  With transition to sitting he was 160/117 with HR at 84 BPM.  In standing BP was 161/114.  He was  able to ambulate to the bathroom and urinate at min assist and then wash his hands at the sink before returning to the EOB and then to supine.  BP again at 160/113 in supine with HOB elevated.  Nursing made aware.     Vision Baseline  Vision/History: 0 No visual deficits Ability to See in Adequate Light: 0 Adequate Vision Assessment?: No apparent visual deficits (will continue to assess in treatment)     Perception Perception Perception: Within Functional Limits   Praxis Praxis Praxis: Intact    Pertinent Vitals/Pain Pain Assessment Pain Assessment: Faces Pain Score: 0-No pain     Hand Dominance Right   Extremity/Trunk Assessment Upper Extremity Assessment Upper Extremity Assessment: Generalized weakness (grip strength 3+/5 bilaterally all other joints 4/5 throughout.  Will continue to eval further in treatment as necessary.)   Lower Extremity Assessment Lower Extremity Assessment: Defer to PT evaluation   Cervical / Trunk Assessment Cervical / Trunk Assessment: Normal   Communication Communication Communication: No difficulties   Cognition Arousal/Alertness: Awake/alert Behavior During Therapy: WFL for tasks assessed/performed Overall Cognitive Status: Within Functional Limits for tasks assessed                                 General Comments: Pt consistent following multi step commands during session.  Pt's family in as well for encouragement when he initially didn't want to get up.                Home Living Family/patient expects to be discharged to:: Private residence Living Arrangements: Spouse/significant other Available Help at Discharge: Available 24 hours/day;Family Type of Home: House Home Access: Level entry;Stairs to enter Entrance Stairs-Number of Steps: 1   Home Layout: Two level;1/2 bath on main level Alternate Level Stairs-Number of Steps: flight Alternate Level Stairs-Rails: Left Bathroom Shower/Tub: Producer, television/film/video: Standard     Home Equipment: Agricultural consultant (2 wheels)   Additional Comments: pt drousy and giving slightly different information than previously charted, no family present to confirm      Prior Functioning/Environment  Prior Level of Function : Independent/Modified Independent;Working/employed;Driving             Mobility Comments: pt reports returned to independence, working as Insurance account manager ADLs Comments: independent, drives; 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 OT Goal Formulation: With patient/family Time For Goal Achievement: 09/11/21 Potential to Achieve Goals: Good  OT Frequency: Min 2X/week       AM-PAC OT "6 Clicks" Daily Activity     Outcome Measure Help from another person eating meals?: None Help from another person taking care of personal grooming?: A Little Help from another person toileting, which includes using toliet, bedpan, or urinal?: A Little Help from another person bathing (including washing, rinsing, drying)?: A Little Help from another person to put on and taking off regular upper body clothing?: A Little Help from another person to put on and taking off regular lower  body clothing?: A Little 6 Click Score: 19   End of Session Nurse Communication: Mobility status (BP)  Activity Tolerance: Patient tolerated treatment well Patient left: in bed;with call bell/phone within reach;with bed alarm set;with family/visitor present  OT Visit Diagnosis: Unsteadiness on feet (R26.81);Muscle weakness (generalized) (M62.81);Hemiplegia and hemiparesis Hemiplegia - Right/Left: Left Hemiplegia - dominant/non-dominant:  Non-Dominant Hemiplegia - caused by: Cerebral infarction                Time: 1331-1353 OT Time Calculation (min): 22 min Charges:  OT General Charges $OT Visit: 1 Visit OT Evaluation $OT Eval Moderate Complexity: 1 Mod Fernando Stoiber OTR/L 08/28/2021, 2:45 PM

## 2021-08-28 NOTE — Progress Notes (Signed)
?Progress Note ? ?Patient: Victor Bryan MHD:[id number] DOB: 1981-02-26  ?DOA: 08/24/2021  DOS: 08/28/2021  ?  ?Brief hospital course: ?Victor Bryan is a 41 y.o. male with a history of HTN, T2DM, PAF, and stroke in Feb 2023 who presented to the ED with confusion related to hypertensive emergency. He was admitted to the ICU on 5/11 with clevidipine infusion with subsequent improvement in mentation as blood pressure came under control. Home oral medications were restarted and his mental status continues to normalize. Neurology was consulted. EEG monitoring detected no electrographic seizures. MRI brain on admission showed 67mm focus of mild diffusion abnormality in the right paramedian splenium which neurology suspects is unrelated to his current presentation and, though new from prior imaging, felt most likely to be chronic ? ?Assessment and Plan: ?Acute hypertensive encephalopathy: Improving ? ?Hypertensive emergency:  ?- Continue home medications. BP has stabilized. Does have some orthostatic changes, so will only modestly augment hydralazine to 75mg  dosing and monitor an additional day.  ? ?History of stroke with new diffusion abnormality consistent with interval subacute stroke. ?- Ambulatory referral to neurology ?- Continue eliquis, statin, HTN management. ? ?PAF: Currently NSR. ?- Continue coreg, diltiazem and eliquis ? ?T2DM: HbA1c 7.2%.  ?- Continue levemir 10u + sensitive novolog SSI (lower than home dose). At inpatient goal. ? ?HLD:  ?- Continue statin. ? ?Headache, history of migraine:  ?- Restart home fioricet  ?- Doubt this is caused by HTN since that is improved and symptom ongoing. ? ?Hypokalemia: Resolved. ? ?Mild AKI on stage IIIb CKD: Cr 3 on admission. Improving to 2.47 with normal UOP.  ?- Would benefit from nephrology following as outpatient.  ? ?Fall in hospital: Reportedly a couple days ago.  ?- PT evaluation > recommending home health which will be arranged prior to discharge. ? ?Allergic  rhinitis:  ?- Flonase, claritin ? ?OSA:  ?- Not on CPAP ? ?Obesity: Estimated body mass index is 31.76 kg/m? as calculated from the following: ?  Height as of this encounter: 5\' 9"  (1.753 m). ?  Weight as of this encounter: 97.6 kg. ? ?Subjective: Diffuse headache also having a runny nose. Headache is constant without weakness or numbness. Feels much less weak than previously, thinking more clearly. Says recently moved here and has limited follow up arranged. ? ?Objective: ?Vitals:  ? 08/28/21 2119 08/28/21 0641 08/28/21 0735 08/28/21 1127  ?BP: (!) 147/97  (!) 145/85 137/89  ?Pulse:   87 82  ?Resp:   19 19  ?Temp:   98.1 ?F (36.7 ?C) 98.1 ?F (36.7 ?C)  ?TempSrc:   Oral Oral  ?SpO2:    97%  ?Weight:  97.6 kg    ?Height:      ? ?Gen: Nontoxic 41 y.o. male in no distress ?Pulm: Nonlabored breathing room air. Clear ?CV: Regular rate and rhythm. No murmur, rub, or gallop. No JVD, no pitting dependent edema. ?GI: Abdomen soft, non-tender, non-distended, with normoactive bowel sounds.  ?Ext: Warm, no deformities ?Skin: No rashes, lesions or ulcers on visualized skin. ?Neuro: Alert and oriented. No focal neurological deficits. ?Psych: Judgement and insight appear fair. Mood euthymic & affect congruent. Behavior is appropriate.   ? ?Data Personally reviewed: ? ?CBC: ?Recent Labs  ?Lab 08/24/21 ?1127 08/24/21 ?1203 08/24/21 ?1204 08/25/21 ?0455 08/27/21 ?4174 08/28/21 ?0051  ?WBC 9.3  --   --  11.3* 9.0 7.9  ?NEUTROABS 5.1  --   --   --   --   --   ?HGB 15.0 15.3 15.0 13.5  13.4 13.3  ?HCT 44.7 45.0 44.0 39.0 38.7* 38.7*  ?MCV 76.9*  --   --  76.0* 76.8* 76.6*  ?PLT 309  --   --  136* 268 286  ? ?Basic Metabolic Panel: ?Recent Labs  ?Lab 08/24/21 ?1127 08/24/21 ?1203 08/24/21 ?1204 08/25/21 ?0455 08/26/21 ?8341 08/27/21 ?9622 08/28/21 ?0051  ?NA 140   < > 140 138 137 137 136  ?K 3.4*   < > 3.5 4.6 3.6 3.4* 3.7  ?CL 107  --  105 106 109 108 107  ?CO2 23  --   --  22 22 23 22   ?GLUCOSE 182*  --  187* 110* 85 125* 191*  ?BUN 19   --  23* 23* 29* 22* 21*  ?CREATININE 2.84*  --  3.00* 2.75* 2.78* 2.48* 2.47*  ?CALCIUM 9.5  --   --  8.7* 8.3* 8.0* 8.1*  ?MG  --   --   --  1.9  --   --   --   ?PHOS  --   --   --  4.1  --   --   --   ? < > = values in this interval not displayed.  ? ?GFR: ?Estimated Creatinine Clearance: 44.9 mL/min (A) (by C-G formula based on SCr of 2.47 mg/dL (H)). ?Liver Function Tests: ?Recent Labs  ?Lab 08/24/21 ?1127  ?AST 30  ?ALT 19  ?ALKPHOS 74  ?BILITOT 0.8  ?PROT 6.2*  ?ALBUMIN 2.5*  ? ?No results for input(s): LIPASE, AMYLASE in the last 168 hours. ?No results for input(s): AMMONIA in the last 168 hours. ?Coagulation Profile: ?No results for input(s): INR, PROTIME in the last 168 hours. ?Cardiac Enzymes: ?No results for input(s): CKTOTAL, CKMB, CKMBINDEX, TROPONINI in the last 168 hours. ?BNP (last 3 results) ?No results for input(s): PROBNP in the last 8760 hours. ?HbA1C: ?Recent Labs  ?  08/27/21 ?0152  ?HGBA1C 7.2*  ? ?CBG: ?Recent Labs  ?Lab 08/27/21 ?1117 08/27/21 ?1550 08/27/21 ?2122 08/28/21 ?0734 08/28/21 ?1126  ?GLUCAP 122* 172* 156* 136* 129*  ? ?Lipid Profile: ?Recent Labs  ?  08/26/21 ?0605 08/27/21 ?0152  ?CHOL  --  234*  ?HDL  --  43  ?LDLCALC  --  169*  ?TRIG 56 110  ?CHOLHDL  --  5.4  ? ?Thyroid Function Tests: ?No results for input(s): TSH, T4TOTAL, FREET4, T3FREE, THYROIDAB in the last 72 hours. ?Anemia Panel: ?No results for input(s): VITAMINB12, FOLATE, FERRITIN, TIBC, IRON, RETICCTPCT in the last 72 hours. ?Urine analysis: ?   ?Component Value Date/Time  ? COLORURINE YELLOW 08/24/2021 1330  ? APPEARANCEUR CLEAR 08/24/2021 1330  ? LABSPEC 1.010 08/24/2021 1330  ? PHURINE 7.0 08/24/2021 1330  ? GLUCOSEU 150 (A) 08/24/2021 1330  ? HGBUR SMALL (A) 08/24/2021 1330  ? BILIRUBINUR NEGATIVE 08/24/2021 1330  ? KETONESUR NEGATIVE 08/24/2021 1330  ? PROTEINUR 100 (A) 08/24/2021 1330  ? NITRITE NEGATIVE 08/24/2021 1330  ? LEUKOCYTESUR NEGATIVE 08/24/2021 1330  ? ?Recent Results (from the past 240 hour(s))   ?MRSA Next Gen by PCR, Nasal     Status: None  ? Collection Time: 08/24/21  6:21 PM  ? Specimen: Nasal Mucosa; Nasal Swab  ?Result Value Ref Range Status  ? MRSA by PCR Next Gen NOT DETECTED NOT DETECTED Final  ?  Comment: (NOTE) ?The GeneXpert MRSA Assay (FDA approved for NASAL specimens only), ?is one component of a comprehensive MRSA colonization surveillance ?program. It is not intended to diagnose MRSA infection nor to guide ?or monitor treatment  for MRSA infections. ?Test performance is not FDA approved in patients less than 2 years ?old. ?Performed at [facility], [place] [address], [place], [place] ?[address] ?  ?Culture, blood (Routine X 2) w Reflex to ID Panel     Status: None (Preliminary result)  ? Collection Time: 08/24/21  6:47 PM  ? Specimen: BLOOD RIGHT HAND  ?Result Value Ref Range Status  ? Specimen Description BLOOD RIGHT HAND  Final  ? Special Requests   Final  ?  BOTTLES DRAWN AEROBIC AND ANAEROBIC Blood Culture adequate volume  ? Culture   Final  ?  NO GROWTH 4 DAYS ?Performed at [facility], [place] [address] [place], [place] [address] ?  ? Report Status PENDING  Incomplete  ?Culture, blood (Routine X 2) w Reflex to ID Panel     Status: None (Preliminary result)  ? Collection Time: 08/24/21  6:52 PM  ? Specimen: BLOOD LEFT ARM  ?Result Value Ref Range Status  ? Specimen Description BLOOD LEFT ARM  Final  ? Special Requests   Final  ?  BOTTLES DRAWN AEROBIC AND ANAEROBIC Blood Culture adequate volume  ? Culture   Final  ?  NO GROWTH 4 DAYS ?Performed at [facility], [place] [address], [place], [place] [address] ?  ? Report Status PENDING  Incomplete  ?   ? ?Family Communication: None at bedside ? ?Disposition: ?Status is: Inpatient ?Remains inpatient appropriate because: Continue HTN management complicated by orthostatic hypotension. ?Planned Discharge Destination: Home ? ? ? ? ? ?Patrecia Pour, MD ?08/28/2021 12:04 PM ?Page by [email or web address]  ?

## 2021-08-28 NOTE — Care Management Important Message (Signed)
Important Message ? ?Patient Details  ?Name: Victor Bryan ?MRN: [id number] ?Date of Birth: 08-05-1980 ? ? ?Medicare Important Message Given:  Yes ? ? ? ? ?[name] ?08/28/2021, 10:20 AM ?

## 2021-08-28 NOTE — Progress Notes (Signed)
eLink Physician-Brief Progress Note ?Patient Name: Victor Bryan ?DOB: [birth year] ?MRN: [id number] ? ? ?Date of Service ? 08/28/2021  ?HPI/Events of Note ? Received request for sleep aide.   ? ?Pt is a 40/M with hypertensive emergency.   ?eICU Interventions ? Melatonin ordered.   ? ? ? ?Intervention Category ?Minor Interventions: Other: ? ?Elsie Lincoln ?08/28/2021, 1:44 AM ?

## 2021-08-28 NOTE — Progress Notes (Signed)
? 08/28/21 1545  ?Clinical Encounter Type  ?Visited With Patient;Family ?[facility]?: [place] [phone])  ?Visit Type Initial ?(Advance Directive Education)  ?Referral From Nurse  ?Consult/Referral To Chaplain ?Albertina Parr [place])  ? ?Chaplain responded to spiritual care consult request for Advance Directive education with [name]. Chaplain met with Victor Bryan and his fianc? Victor Bryan at patient's bedside. ? ? Victor Bryan indicated that he intends to list the following as his [place] of Attorney Agents: ?Mother: Victor Bryan [phone] and  ?Fianc?: Victor Bryan [phone] ? ?Chaplain provided the Advance Directive packet as well as education on Advance Directives-documents an individual completes to communicate their health care directions in advance of a time when they may need them. Chaplain informed Victor Bryan the documents which may be completed here in the hospital are the Living Will and [place]. ? ?Chaplain informed patient that the [place] is a legal document in which an individual names another person, as their [place], to make health care decisions when the individual is not able to make them for themselves. The Health Care Agent's function can be temporary or permanent depending on his ability to make and communicate those decisions independently.  ? ?Chaplain informed Victor Bryan in the absence of a [place], the state of [place] directs health care providers to look to the following individuals in the order listed: legal guardian; an attorney-in-fact under a general power of attorney (POA) if that POA includes the right to make health care decisions; person's spouse; a 30 of his children; a 36 of his adult brothers and sisters; or an individual who has an established relationship with you, who is acting in good faith and who can convey your  wishes.  If none of these persons are available or willing to make medical decisions on a patient's behalf, the law allows the patient's doctor to make decisions for them as long as another doctor agrees with those decisions.   ? ?Chaplain also informed the patient that the Health Care agent has no decision-making authority over any affairs other than those related to his medical care. ?  ?The chaplain further educated Victor Bryan that a Living Will is a legal document that allows his desire not to receive life-prolonging measures in the event that they have a condition that is incurable and will result in his death in a short period of time; they are unconscious, and doctors are confident that they will not regain consciousness; and/or they have advanced dementia or other substantial and irreversible loss of mental function.  ? ?The chaplain informed Victor Bryan that life-prolonging measures are medical treatments that would only serve to postpone death, including breathing machines, kidney dialysis, antibiotics, artificial nutrition and hydration (tube feeding), and similar forms of treatment and that if an individual is able to express their wishes, they may also make them known without the use of a Living Will, but in the event that he is not able to express  wishes, a Living Will allows medical providers and his family and friends to ensure that they are not making decisions on the his behalf, but rather serving as his voice to convey decisions that he  has already made. ?  ?Victor Bryan is aware that the decision to create an advance directive is his alone and he may choose not to complete the documents or may choose to complete one portion or both.  Victor Bryan was informed that he can revoke the documents at any time by striking through them and writing void or by completing new documents, but that it is also advisable that the individual verbally notify interested parties that their wishes have  changed. ?  ?Victor Bryan is also aware that the document must be signed in the presence of a notary public and two witnesses and that this can be done while the patient is still admitted to the hospital or after discharge in the community. If they decide to complete Advance Directives after being discharged from the hospital, they have been advised to notify all interested parties and to provide those documents to their physicians and loved ones in addition to bringing them to the hospital in the event of another hospitalization. ?  ?The chaplain informed Victor Bryan that if he desires to proceed with completing Advance Directive Documentation while he is still admitted, notary services are typically available at [facility] between the hours of 1:00 and 3:30 Monday-Thursday. ?  ?When the patient is ready to have these documents completed, the patient should request that their nurse place a spiritual care consult and indicate that the patient is ready to have their advance directives notarized so that arrangements for witnesses and notary public can be made. ?  ?Please page spiritual care if the patient desires further education or has questions. ?  ?Orest Dikes, Ivin Poot., [phone]. ?

## 2021-08-28 NOTE — Evaluation (Signed)
Physical Therapy Evaluation ?Patient Details ?Name: Victor Bryan ?MRN: [id number] ?DOB: 1980-11-11 ?Today's Date: 08/28/2021 ? ?History of Present Illness ? The pt is a 41 yo male presenting 5/11 with AMS, found to have HTN with SBP in 200s. MRI showed 26mm abnormality of R paramedian splenium, most consistent with evolving subacute ischemic infarct. PMH includes: R frontal CVA, afib, HLD, HTN, DM II, and OSA. ?  ?Clinical Impression ? Pt in bed upon arrival of PT, agreeable to evaluation at this time. 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 The pt was asleep upon my arrival, but agreeable to session despite fatigue. He was able to complete transitions with supervision for safety, but with transition to standing his BP dropped from 135/90 (104) to low of 93/69 (78) with pt reporting dizziness and fatigue. Pt then returned to supine, will need to further assess ambulation and stairs in future sessions. Given the pt's current limitations in functional mobility, activity tolerance, and stability due to above dx, he will continue to benefit from skilled PT to address these deficits and facilitate return to prior level of independence. Will further assess appropriate follow-up therapies as able to progress ambulation and OOB mobility.  ?   ?   ? ?Recommendations for follow up therapy are one component of a multi-disciplinary discharge planning process, led by the attending physician.  Recommendations may be updated based on patient status, additional functional criteria and insurance authorization. ? ?Follow Up Recommendations Home health PT (pending progression) ? ?  ?Assistance Recommended at Discharge Frequent or constant Supervision/Assistance  ?Patient can return home with the following ? A little help with walking and/or transfers;A little help with bathing/dressing/bathroom;Assistance with  cooking/housework;Assist for transportation;Help with stairs or ramp for entrance ? ?  ?Equipment Recommendations  (shower seat)  ?Recommendations for Other Services ? OT consult  ?  ?Functional Status Assessment Patient has had a recent decline in their functional status and demonstrates the ability to make significant improvements in function in a reasonable and predictable amount of time.  ? ?  ?Precautions / Restrictions Precautions ?Precautions: Fall ?Precaution Comments: watch bP ?Restrictions ?Weight Bearing Restrictions: No  ? ?  ? ?Mobility ? Bed Mobility ?Overal bed mobility: Independent ?  ?  ?  ?  ?  ?  ?General bed mobility comments: increased time due to fatigue ?  ? ?Transfers ?Overall transfer level: Needs assistance ?Equipment used: None ?Transfers: Sit to/from Stand ?Sit to Stand: Supervision ?  ?  ?  ?  ?  ?General transfer comment: able to stand x2 from EOB without assist or use of UE. pt with reports of dizziness in standing with BP from 135/90 (104) to 93/69 (78) ?  ? ?Ambulation/Gait ?  ?  ?  ?  ?  ?  ?  ?General Gait Details: deferred more than steps along EOB due to pt dizziness with soft BP. pt requesting to return to sitting ? ? ?Modified Rankin (Stroke Patients Only) ?Modified Rankin (Stroke Patients Only) ?Pre-Morbid Rankin Score: No symptoms ?Modified Rankin: Moderately severe disability ? ?  ? ?Balance Overall balance assessment: Needs assistance ?Sitting-balance support: No upper extremity supported, Feet supported ?Sitting balance-Leahy Scale: Good ?  ?  ?Standing balance support: No upper extremity supported ?Standing balance-Leahy Scale: Fair ?Standing balance comment: static stance without UE support, mild instability noted with smal lateral steps along EOB but no overt LOB. limited assessment by soft  BP ?  ?  ?  ?  ?  ?  ?  ?  ?  ?  ?  ?   ? ? ? ?Pertinent Vitals/Pain Pain Assessment ?Pain Assessment: Faces ?Faces Pain Scale: Hurts little more ?Pain Location: headache ?Pain  Descriptors / Indicators: Discomfort, Headache ?Pain Intervention(s): Limited activity within patient's tolerance, Monitored during session, Repositioned, Patient requesting pain meds-RN notified  ? ? ?Home Living Family/patient expects to be discharged to:: Private residence ?Living Arrangements: Spouse/significant other ?Available Help at Discharge: Available 24 hours/day;Family ?Type of Home: House ?Home Access: Level entry;Stairs to enter ?  ?Entrance Stairs-Number of Steps: 1 ?Alternate Level Stairs-Number of Steps: flight ?Home Layout: Two level;1/2 bath on main level ?Home Equipment: Conservation officer, nature (2 wheels) ?Additional Comments: pt drousy and giving slightly different information than previously charted, no family present to confirm  ?  ?Prior Function Prior Level of Function : Independent/Modified Independent;Working/employed;Driving ?  ?  ?  ?  ?  ?  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  ? ? ?Hand Dominance  ? Dominant Hand: Right ? ?  ?Extremity/Trunk Assessment  ? Upper Extremity Assessment ?Upper Extremity Assessment: Overall WFL for tasks assessed (pt reports no changes in sensation, mild coordination deficits in LUE) ?  ? ?Lower Extremity Assessment ?Lower Extremity Assessment: LLE deficits/detail (grossly functional strength, 4/5 at ankle, knee, and hip. pt with mild coordination deficits in LLE) ?LLE Deficits / Details: grossly 4/5, mild coordination deficits ?LLE Coordination: decreased fine motor ?  ? ?Cervical / Trunk Assessment ?Cervical / Trunk Assessment: Normal  ?Communication  ? Communication: No difficulties  ?Cognition Arousal/Alertness: Lethargic ?Behavior During Therapy: Flat affect, WFL for tasks assessed/performed ?Overall Cognitive Status: No family/caregiver present to determine baseline cognitive functioning ?  ?  ?  ?  ?  ?  ?  ?  ?  ?  ?  ?  ?   ?  ?  ?  ?General Comments: difficult to assess due to pt being drowsy, he gave slightly inconsistent answers compared to prior chart information, but was otherwise able to follow all cues and instructions. ?  ?  ? ?  ?General Comments General comments (skin integrity, edema, etc.): BP from 135/90 (104) in sitting to 119/80 (91) in sitting to 93/69 (78) in standing with pt requesting return to bed. 127/88 (99) in supine ? ?  ?   ? ?Assessment/Plan  ?  ?PT Assessment Patient needs continued PT services  ?PT Problem List Decreased balance;Decreased activity tolerance;Decreased mobility;Decreased safety awareness;Cardiopulmonary status limiting activity ? ?   ?  ?PT Treatment Interventions DME instruction;Gait training;Functional mobility training;Stair training;Therapeutic activities;Therapeutic exercise;Balance training;Patient/family education   ? ?PT Goals (Current goals can be found in the Care Plan section)  ?Acute Rehab PT Goals ?Patient Stated Goal: get some rest and feel better ?PT Goal Formulation: With patient ?Time For Goal Achievement: 09/11/21 ?Potential to Achieve Goals: Good ? ?  ?Frequency Min 3X/week ?  ? ? ?   ?AM-PAC PT "6 Clicks" Mobility  ?Outcome Measure Help needed turning from your back to your side while in a flat bed without using bedrails?: A Little ?Help needed moving from lying on your back to sitting on the side of a flat bed without using bedrails?: A Little ?Help needed moving to and from a bed to a chair (including a wheelchair)?: A Little ?Help needed standing up from a  chair using your arms (e.g., wheelchair or bedside chair)?: A Little ?Help needed to walk in hospital room?: Total ?Help needed climbing 3-5 steps with a railing? : Total ?6 Click Score: 14 ? ?  ?End of Session Equipment Utilized During Treatment: Gait belt ?Activity Tolerance: Patient limited by fatigue;Other (comment) (soft BP and dizziness) ?Patient left: in bed;with call bell/phone within reach ?Nurse  Communication: Mobility status;Patient requests pain meds ?PT Visit Diagnosis: Unsteadiness on feet (R26.81);Other abnormalities of gait and mobility (R26.89) ?  ? ?Time: 1000-1024 ?PT Time Calculation (min) (ACUTE ONL

## 2021-08-28 NOTE — TOC CAGE-AID Note (Signed)
Transition of Care (TOC) - CAGE-AID Screening ? ? ?Patient Details  ?Name: Victor Bryan ?MRN: [id number] ?Date of Birth: [birth year] ? ?Transition of Care (TOC) CM/SW Contact:    ?Victor Bryan, LCSWA ?Phone Number: ?08/28/2021, 2:08 PM ? ? ?Clinical Narrative: ?Pt participated in [place].  Pt stated he does use substance (marijuana).  Pt was offered resources, due to usage of substance.    ? ?Passenger transport manager, MSW, LCSW-A ?Pronouns:  She/Her/Hers ?[facility] ?Clinical Social Worker ?Direct Number:  [phone] ?[email or web address] ? ?CAGE-AID Screening: ?  ? ?Have You Ever Felt You Ought to Cut Down on Your Drinking or Drug Use?: No ?Have People Annoyed You By Critizing Your Drinking Or Drug Use?: No ?Have You Felt Bad Or Guilty About Your Drinking Or Drug Use?: No ?Have You Ever Had a Drink or Used Drugs First Thing In The Morning to Steady Your Nerves or to Get Rid of a Hangover?: No ?CAGE-AID Score: 0 ? ?Substance Abuse Education Offered: Yes ? ?Substance abuse interventions: Educational Materials ? ? ? ? ? ? ?

## 2021-08-29 ENCOUNTER — Other Ambulatory Visit (HOSPITAL_COMMUNITY): Payer: Self-pay

## 2021-08-29 DIAGNOSIS — I674 Hypertensive encephalopathy: Secondary | ICD-10-CM | POA: Diagnosis not present

## 2021-08-29 DIAGNOSIS — I161 Hypertensive emergency: Secondary | ICD-10-CM | POA: Diagnosis not present

## 2021-08-29 LAB — CULTURE, BLOOD (ROUTINE X 2)
Culture: NO GROWTH
Culture: NO GROWTH
Special Requests: ADEQUATE
Special Requests: ADEQUATE

## 2021-08-29 LAB — CBC
HCT: 40.1 % (ref 39.0–52.0)
Hemoglobin: 13.1 g/dL (ref 13.0–17.0)
MCH: 25.6 pg — ABNORMAL LOW (ref 26.0–34.0)
MCHC: 32.7 g/dL (ref 30.0–36.0)
MCV: 78.3 fL — ABNORMAL LOW (ref 80.0–100.0)
Platelets: 272 10*3/uL (ref 150–400)
RBC: 5.12 MIL/uL (ref 4.22–5.81)
RDW: 14.1 % (ref 11.5–15.5)
WBC: 7.2 10*3/uL (ref 4.0–10.5)
nRBC: 0 % (ref 0.0–0.2)

## 2021-08-29 LAB — GLUCOSE, CAPILLARY
Glucose-Capillary: 181 mg/dL — ABNORMAL HIGH (ref 70–99)
Glucose-Capillary: 121 mg/dL — ABNORMAL HIGH (ref 70–99)
Glucose-Capillary: 136 mg/dL — ABNORMAL HIGH (ref 70–99)
Glucose-Capillary: 197 mg/dL — ABNORMAL HIGH (ref 70–99)

## 2021-08-29 MED ORDER — HYDRALAZINE HCL 50 MG PO TABS
50.0000 mg | ORAL_TABLET | Freq: Three times a day (TID) | ORAL | Status: DC
Start: 2021-08-29 — End: 2021-08-30
  Administered 2021-08-29 – 2021-08-30 (×3): 50 mg via ORAL
  Filled 2021-08-29 (×3): qty 1

## 2021-08-29 MED ORDER — DIPHENHYDRAMINE HCL 25 MG PO CAPS
25.0000 mg | ORAL_CAPSULE | Freq: Once | ORAL | Status: DC | PRN
Start: 2021-08-29 — End: 2021-08-31

## 2021-08-29 NOTE — Progress Notes (Signed)
?Progress Note ? ?Patient: Victor Bryan WUJ:[id number] DOB: 15-May-1980  ?DOA: 08/24/2021  DOS: 08/29/2021  ?  ?Brief hospital course: ?Victor Bryan is a 41 y.o. male with a history of HTN, T2DM, PAF, and stroke in Feb 2023 who presented to the ED with confusion related to hypertensive emergency. He was admitted to the ICU on 5/11 with clevidipine infusion with subsequent improvement in mentation as blood pressure came under control. Home oral medications were restarted and his mental status continues to normalize. Neurology was consulted. EEG monitoring detected no electrographic seizures. MRI brain on admission showed 39mm focus of mild diffusion abnormality in the right paramedian splenium which neurology suspects is unrelated to his current presentation and, though new from prior imaging, felt most likely to be chronic ? ?Assessment and Plan: ?Acute hypertensive encephalopathy: Improving ? ?Hypertensive emergency:  ?- Continue home medications. BP improved from admission, now having significantly symptomatic orthostatic hypotension. For now may have to allow higher-than-goal SBP to avoid orthostasis. Ordered TED hose and deescalated hydralazine dosing. Will have to monitor an additional day.  ? ?History of stroke with new diffusion abnormality consistent with interval subacute stroke. ?- Ambulatory referral to neurology ?- Continue eliquis, statin, HTN management. ? ?PAF: Currently NSR. ?- Continue coreg, diltiazem and eliquis ? ?T2DM: HbA1c 7.2%.  ?- Continue levemir 10u + sensitive novolog SSI (lower than home dose). At inpatient goal. ? ?HLD:  ?- Continue statin. ? ?Headache, history of migraine:  ?- Restarted home fioricet, much improved. ? ?Hypokalemia: Resolved. ? ?Mild AKI on stage IIIb CKD: Cr 3 on admission. Improving to 2.47 with normal UOP.  ?- Would benefit from nephrology following as outpatient.  ?- Recheck BMP in AM. ? ?Fall in hospital: Reportedly a couple days ago.  ?- PT evaluation >  recommending home health which has been ordered and will be arranged prior to discharge. ? ?Allergic rhinitis:  ?- Flonase, claritin ? ?OSA:  ?- Not on CPAP ? ?Obesity: Estimated body mass index is 31.57 kg/m? as calculated from the following: ?  Height as of this encounter: 5\' 9"  (1.753 m). ?  Weight as of this encounter: 97 kg. ? ?Subjective: Headache much improved today, no chest pain or dyspnea. He feels fatigued, tired, and had significant lightheadedness when attempting to stand this morning. BP dropped precipitously. ? ?Objective: ?Vitals:  ? 08/29/21 0800 08/29/21 0900 08/29/21 0903 08/29/21 0915  ?BP: (!) 155/101 118/81 95/63 90/72   ?Pulse: 91     ?Resp: 17 18    ?Temp:      ?TempSrc:      ?SpO2: 100%     ?Weight:      ?Height:      ? ?Gen: 41 y.o. male in no distress ?Pulm: Nonlabored breathing room air. Clear. ?CV: Regular rate and rhythm. No murmur, rub, or gallop. No JVD, no dependent edema. ?GI: Abdomen soft, non-tender, non-distended, with normoactive bowel sounds.  ?Ext: Warm, no deformities ?Skin: No rashes, lesions or ulcers on visualized skin. ?Neuro: Alert and oriented. No focal neurological deficits. ?Psych: Judgement and insight appear fair. Mood euthymic & affect congruent. Behavior is appropriate.   ? ?Data Personally reviewed: ? ?CBC: ?Recent Labs  ?Lab 08/24/21 ?1127 08/24/21 ?1203 08/24/21 ?1204 08/25/21 ?0455 08/27/21 ?0152 08/28/21 ?0051 08/29/21 ?0424  ?WBC 9.3  --   --  11.3* 9.0 7.9 7.2  ?NEUTROABS 5.1  --   --   --   --   --   --   ?HGB 15.0   < > 15.0  13.5 13.4 13.3 13.1  ?HCT 44.7   < > 44.0 39.0 38.7* 38.7* 40.1  ?MCV 76.9*  --   --  76.0* 76.8* 76.6* 78.3*  ?PLT 309  --   --  136* 268 286 272  ? < > = values in this interval not displayed.  ? ?Basic Metabolic Panel: ?Recent Labs  ?Lab 08/24/21 ?1127 08/24/21 ?1203 08/24/21 ?1204 08/25/21 ?0455 08/26/21 ?3354 08/27/21 ?5625 08/28/21 ?0051  ?NA 140   < > 140 138 137 137 136  ?K 3.4*   < > 3.5 4.6 3.6 3.4* 3.7  ?CL 107  --  105 106  109 108 107  ?CO2 23  --   --  22 22 23 22   ?GLUCOSE 182*  --  187* 110* 85 125* 191*  ?BUN 19  --  23* 23* 29* 22* 21*  ?CREATININE 2.84*  --  3.00* 2.75* 2.78* 2.48* 2.47*  ?CALCIUM 9.5  --   --  8.7* 8.3* 8.0* 8.1*  ?MG  --   --   --  1.9  --   --   --   ?PHOS  --   --   --  4.1  --   --   --   ? < > = values in this interval not displayed.  ? ?GFR: ?Estimated Creatinine Clearance: 44.7 mL/min (A) (by C-G formula based on SCr of 2.47 mg/dL (H)). ?Liver Function Tests: ?Recent Labs  ?Lab 08/24/21 ?1127  ?AST 30  ?ALT 19  ?ALKPHOS 74  ?BILITOT 0.8  ?PROT 6.2*  ?ALBUMIN 2.5*  ? ?No results for input(s): LIPASE, AMYLASE in the last 168 hours. ?No results for input(s): AMMONIA in the last 168 hours. ?Coagulation Profile: ?No results for input(s): INR, PROTIME in the last 168 hours. ?Cardiac Enzymes: ?No results for input(s): CKTOTAL, CKMB, CKMBINDEX, TROPONINI in the last 168 hours. ?BNP (last 3 results) ?No results for input(s): PROBNP in the last 8760 hours. ?HbA1C: ?Recent Labs  ?  08/27/21 ?0152  ?HGBA1C 7.2*  ? ?CBG: ?Recent Labs  ?Lab 08/28/21 ?0734 08/28/21 ?1126 08/28/21 ?1616 08/28/21 ?2112 08/29/21 ?0809  ?GLUCAP 136* 129* 136* 170* 181*  ? ?Lipid Profile: ?Recent Labs  ?  08/27/21 ?0152  ?CHOL 234*  ?HDL 43  ?LDLCALC 169*  ?TRIG 110  ?CHOLHDL 5.4  ? ?Thyroid Function Tests: ?No results for input(s): TSH, T4TOTAL, FREET4, T3FREE, THYROIDAB in the last 72 hours. ?Anemia Panel: ?No results for input(s): VITAMINB12, FOLATE, FERRITIN, TIBC, IRON, RETICCTPCT in the last 72 hours. ?Urine analysis: ?   ?Component Value Date/Time  ? COLORURINE YELLOW 08/24/2021 1330  ? APPEARANCEUR CLEAR 08/24/2021 1330  ? LABSPEC 1.010 08/24/2021 1330  ? PHURINE 7.0 08/24/2021 1330  ? GLUCOSEU 150 (A) 08/24/2021 1330  ? HGBUR SMALL (A) 08/24/2021 1330  ? BILIRUBINUR NEGATIVE 08/24/2021 1330  ? KETONESUR NEGATIVE 08/24/2021 1330  ? PROTEINUR 100 (A) 08/24/2021 1330  ? NITRITE NEGATIVE 08/24/2021 1330  ? LEUKOCYTESUR NEGATIVE  08/24/2021 1330  ? ?Recent Results (from the past 240 hour(s))  ?MRSA Next Gen by PCR, Nasal     Status: None  ? Collection Time: 08/24/21  6:21 PM  ? Specimen: Nasal Mucosa; Nasal Swab  ?Result Value Ref Range Status  ? MRSA by PCR Next Gen NOT DETECTED NOT DETECTED Final  ?  Comment: (NOTE) ?The GeneXpert MRSA Assay (FDA approved for NASAL specimens only), ?is one component of a comprehensive MRSA colonization surveillance ?program. It is not intended to diagnose MRSA infection nor to  guide ?or monitor treatment for MRSA infections. ?Test performance is not FDA approved in patients less than 2 years ?old. ?Performed at [facility], [place] [address] [place], [place] ?[address] ?  ?Culture, blood (Routine X 2) w Reflex to ID Panel     Status: None  ? Collection Time: 08/24/21  6:47 PM  ? Specimen: BLOOD RIGHT HAND  ?Result Value Ref Range Status  ? Specimen Description BLOOD RIGHT HAND  Final  ? Special Requests   Final  ?  BOTTLES DRAWN AEROBIC AND ANAEROBIC Blood Culture adequate volume  ? Culture   Final  ?  NO GROWTH 5 DAYS ?Performed at [facility], [place] [address] [place], [place] [address] ?  ? Report Status 08/29/2021 FINAL  Final  ?Culture, blood (Routine X 2) w Reflex to ID Panel     Status: None  ? Collection Time: 08/24/21  6:52 PM  ? Specimen: BLOOD LEFT ARM  ?Result Value Ref Range Status  ? Specimen Description BLOOD LEFT ARM  Final  ? Special Requests   Final  ?  BOTTLES DRAWN AEROBIC AND ANAEROBIC Blood Culture adequate volume  ? Culture   Final  ?  NO GROWTH 5 DAYS ?Performed at [facility], [place] [address] [place], [place] [address] ?  ? Report Status 08/29/2021 FINAL  Final  ?   ? ?Family Communication: None at bedside ? ?Disposition: ?Status is: Inpatient ?Remains inpatient appropriate because: Continue HTN management complicated by orthostatic hypotension. Hopeful to DC home w/HH (ordered) 5/17. CM also working to have patient get appointment with local PCP. ?Planned  Discharge Destination: Home ? ? ? ?Patrecia Pour, MD ?08/29/2021 11:13 AM ?Page by [email or web address]  ?

## 2021-08-29 NOTE — Progress Notes (Signed)
PT Cancellation Note ? ?Patient Details ?Name: Victor Bryan ?MRN: [id number] ?DOB: 10-24-1980 ? ? ?Cancelled Treatment:    Reason Eval/Treat Not Completed: Medical issues which prohibited therapy (BP supine 182/125 and currently above set parameters) ? ? ?Helana Macbride B Darius Fillingim ?08/29/2021, 7:36 AM ?Temitayo Covalt P, PT ?Acute Rehabilitation Services ?Pager: [phone] ?Office: [phone] ? ?

## 2021-08-29 NOTE — TOC Progression Note (Signed)
Transition of Care (TOC) - Progression Note  ? ? ?Patient Details  ?Name: Victor Bryan ?MRN: [id number] ?Date of Birth: 1981/02/17 ? ?Transition of Care (TOC) CM/SW Contact  ?Bethena Roys, RN ?Phone Number: ?08/29/2021, 12:10 PM ? ?Clinical Narrative:  [facility] is trying to verify [employer] for home health PT services. Case Manager attempted to call the [facility] for PCP needs-office closed for lunch. Case Manager will call back to clinic at 1300. Appointment will be placed on the AVS.  ? ?Expected Discharge Plan: [place] ?Barriers to Discharge: No Barriers Identified ? ?Expected Discharge Plan and Services ?Expected Discharge Plan: [place] ?In-house Referral: NA ?Discharge Planning Services: CM Consult ?Post Acute Care Choice: Home Health ?  ?                ?DME Arranged: Shower stool ?DME Agency: [employer] ?Date DME Agency Contacted: 08/28/21 ?Time DME Agency Contacted: 1640 ?Representative spoke with at DME Agency: Mardene Celeste ?HH Arranged: PT ?[place] Agency: [place] ([place]) ?Date HH Agency Contacted: 08/28/21 ?Time [place]: 3734 ?Representative spoke with at [place]: Corene Cornea ? ?Readmission Risk Interventions ? ?  08/28/2021  ?  4:43 PM  ?Readmission Risk Prevention Plan  ?Transportation Screening Complete  ?PCP or Specialist Appt within 3-5 Days Complete  ?[place] or Home Care Consult Complete  ?Social Work Consult for [place] Planning/Counseling Complete  ?Palliative Care Screening Not Applicable  ?Medication Review Press photographer) Complete  ? ? ?

## 2021-08-29 NOTE — Progress Notes (Signed)
Physical Therapy Treatment ?Patient Details ?Name: Victor Bryan ?MRN: [id number] ?DOB: May 05, 1980 ?Today's Date: 08/29/2021 ? ? ?History of Present Illness 41 yo male adm 5/11 with AMS, HTN with SBP in 200s. MRI: 9mm abnormality of R paramedian splenium, most consistent with evolving subacute ischemic infarct. PMHx:R frontal CVA, afib, HLD, HTN, DM II, and OSA. ? ?  ?PT Comments  ? ? Pt with initial BP on first session attempt 182/125 and too high for mobility. On return to room pt in chair with BP 118/81 (91) with drop to 95/63 (74) with standing and pt reporting weakness and fatigue. Returned to sitting to perform HEP in hopes of elevating BP for functional mobility with return to sitting BP 113/78 (91) and 2nd stand 90/72 (79) with extreme fatigue and unable to tolerate further mobility with return to bed per pt request. Rn present and aware. Will continue to follow and progress mobility as medical stability allows.  ?   ?Recommendations for follow up therapy are one component of a multi-disciplinary discharge planning process, led by the attending physician.  Recommendations may be updated based on patient status, additional functional criteria and insurance authorization. ? ?Follow Up Recommendations ? Home health PT ?  ?  ?Assistance Recommended at Discharge Intermittent Supervision/Assistance  ?Patient can return home with the following A little help with walking and/or transfers;A little help with bathing/dressing/bathroom;Assistance with cooking/housework;Assist for transportation;Help with stairs or ramp for entrance ?  ?Equipment Recommendations ? None recommended by PT  ?  ?Recommendations for Other Services   ? ? ?  ?Precautions / Restrictions Precautions ?Precautions: Fall ?Precaution Comments: SBP 150-170, watch BP orthostatic  ?  ? ?Mobility ? Bed Mobility ?Overal bed mobility: Modified Independent ?  ?  ?  ?  ?  ?  ?General bed mobility comments: sit to supine ?  ? ?Transfers ?Overall transfer  level: Modified independent ?  ?  ?  ?Stand pivot transfers: Min guard ?  ?  ?  ?  ?General transfer comment: pt able to move and manage lines with guarding for safety with BP changes ?  ? ?Ambulation/Gait ?  ?  ?  ?  ?  ?  ?  ?General Gait Details: unable due to fatigue and bP drop ? ? ?Stairs ?  ?  ?  ?  ?  ? ? ?Wheelchair Mobility ?  ? ?Modified Rankin (Stroke Patients Only) ?  ? ? ?  ?Balance   ?  ?Sitting balance-Leahy Scale: Good ?  ?  ?  ?Standing balance-Leahy Scale: Good ?Standing balance comment: static standing without UE support ?  ?  ?  ?  ?  ?  ?  ?  ?  ?  ?  ?  ? ?  ?Cognition Arousal/Alertness: Awake/alert ?Behavior During Therapy: Flat affect ?Overall Cognitive Status: Within Functional Limits for tasks assessed ?  ?  ?  ?  ?  ?  ?  ?  ?  ?  ?  ?  ?  ?  ?  ?  ?  ?  ?  ? ?  ?Exercises General Exercises - Upper Extremity ?Shoulder Flexion: AROM, Both, Seated, 10 reps ?General Exercises - Lower Extremity ?Long Arc Quad: AROM, Both, Seated, 20 reps ?Hip Flexion/Marching: AROM, Both, 20 reps, Seated ? ?  ?General Comments   ?  ?  ? ?Pertinent Vitals/Pain Pain Assessment ?Pain Assessment: No/denies pain  ? ? ?Home Living   ?  ?  ?  ?  ?  ?  ?  ?  ?  ?   ?  ?  Prior Function    ?  ?  ?   ? ?PT Goals (current goals can now be found in the care plan section) Progress towards PT goals: Not progressing toward goals - comment (limited by BP) ? ?  ?Frequency ? ? ? Min 3X/week ? ? ? ?  ?PT Plan Current plan remains appropriate  ? ? ?Co-evaluation   ?  ?  ?  ?  ? ?  ?AM-PAC PT "6 Clicks" Mobility   ?Outcome Measure ? Help needed turning from your back to your side while in a flat bed without using bedrails?: None ?Help needed moving from lying on your back to sitting on the side of a flat bed without using bedrails?: None ?Help needed moving to and from a bed to a chair (including a wheelchair)?: A Little ?Help needed standing up from a chair using your arms (e.g., wheelchair or bedside chair)?: A Little ?Help  needed to walk in hospital room?: Total ?Help needed climbing 3-5 steps with a railing? : Total ?6 Click Score: 16 ? ?  ?End of Session   ?Activity Tolerance: Treatment limited secondary to medical complications (Comment) ?Patient left: in bed;with call bell/phone within reach;with bed alarm set ?Nurse Communication: Mobility status ?PT Visit Diagnosis: Unsteadiness on feet (R26.81);Other abnormalities of gait and mobility (R26.89) ?  ? ? ?Time: 8811-0315 ?PT Time Calculation (min) (ACUTE ONLY): 17 min ? ?Charges:  $Therapeutic Activity: 8-22 mins          ?          ? ?Raegen Tarpley P, PT ?Acute Rehabilitation Services ?Pager: [phone] ?Office: [phone] ? ? ? ?Abimbola Aki B Burnis Kaser ?08/29/2021, 9:18 AM ? ?

## 2021-08-29 NOTE — TOC Benefit Eligibility Note (Signed)
Patient Advocate Encounter ? ?Insurance verification completed.   ? ?The patient is currently admitted and upon discharge could be taking Eliquis 5 mg. ? ?The current 30 day co-pay is, $4.30.  ? ?The patient is insured through [place] [employer]  ? ? ? ?Lyndel Safe, CPhT ?Pharmacy Patient Advocate Specialist ?[facility] Patient Advocate Team ?Direct Number: [phone]  Fax: [phone] ? ? ? ? ? ?  ?

## 2021-08-29 NOTE — Progress Notes (Signed)
Pt reported at shift change that two days ago he was watching TV and he felt like he was having a difficult time processing. At shift change pt states that he has times that he feels foggy headed. Night shift nurse to follow up on this.  ?

## 2021-08-30 LAB — BASIC METABOLIC PANEL
Anion gap: 6 (ref 5–15)
BUN: 17 mg/dL (ref 6–20)
CO2: 23 mmol/L (ref 22–32)
Calcium: 8.5 mg/dL — ABNORMAL LOW (ref 8.9–10.3)
Chloride: 110 mmol/L (ref 98–111)
Creatinine, Ser: 2.16 mg/dL — ABNORMAL HIGH (ref 0.61–1.24)
GFR, Estimated: 38 mL/min — ABNORMAL LOW (ref >60)
Glucose, Bld: 131 mg/dL — ABNORMAL HIGH (ref 70–99)
Potassium: 4.1 mmol/L (ref 3.5–5.1)
Sodium: 139 mmol/L (ref 135–145)

## 2021-08-30 LAB — GLUCOSE, CAPILLARY
Glucose-Capillary: 183 mg/dL — ABNORMAL HIGH (ref 70–99)
Glucose-Capillary: 124 mg/dL — ABNORMAL HIGH (ref 70–99)
Glucose-Capillary: 156 mg/dL — ABNORMAL HIGH (ref 70–99)
Glucose-Capillary: 166 mg/dL — ABNORMAL HIGH (ref 70–99)

## 2021-08-30 MED ORDER — HYDRALAZINE HCL 20 MG/ML IJ SOLN
5.0000 mg | INTRAMUSCULAR | Status: DC | PRN
Start: 2021-08-30 — End: 2021-08-31

## 2021-08-30 MED ORDER — HYDRALAZINE HCL 25 MG PO TABS
25.0000 mg | ORAL_TABLET | Freq: Three times a day (TID) | ORAL | Status: DC
Start: 2021-08-30 — End: 2021-08-31
  Administered 2021-08-30 – 2021-08-31 (×4): 25 mg via ORAL
  Filled 2021-08-30 (×4): qty 1

## 2021-08-30 NOTE — TOC Progression Note (Addendum)
Transition of Care (TOC) - Progression Note  ? ? ?Patient Details  ?Name: Victor Bryan ?MRN: [id number] ?Date of Birth: 02-Sep-1980 ? ?Transition of Care (TOC) CM/SW Contact  ?Bethena Roys, RN ?Phone Number: ?08/30/2021, 10:08 AM ? ?Clinical Narrative:  Case Manager scheduled the hospital follow up appointment for the patient at the [facility]. [facility] is aware that the patient will discharge later this evening. No further needs from Case Manager at this time.  ? ?1119- Patient declined the shower chair from [employer].  ? ?Expected Discharge Plan: [place] ?Barriers to Discharge: No Barriers Identified ? ?Expected Discharge Plan and Services ?Expected Discharge Plan: [place] ?In-house Referral: NA ?Discharge Planning Services: CM Consult ?Post Acute Care Choice: Home Health ?  ?                ?DME Arranged: Shower stool ?DME Agency: [employer] ?Date DME Agency Contacted: 08/28/21 ?Time DME Agency Contacted: 1640 ?Representative spoke with at DME Agency: Mardene Celeste ?HH Arranged: PT ?[place] Agency: [facility] ([place]) ?Date HH Agency Contacted: 08/28/21 ?Time [place]: 6553 ?Representative spoke with at [place]: Corene Cornea ? ? ?Social Determinants of Health (SDOH) Interventions ?  ? ?Readmission Risk Interventions ? ?  08/28/2021  ?  4:43 PM  ?Readmission Risk Prevention Plan  ?Transportation Screening Complete  ?PCP or Specialist Appt within 3-5 Days Complete  ?[place] or Home Care Consult Complete  ?Social Work Consult for [place] Planning/Counseling Complete  ?Palliative Care Screening Not Applicable  ?Medication Review Press photographer) Complete  ? ? ?

## 2021-08-30 NOTE — Progress Notes (Signed)
At shift change, pt expressed concern that his processing seemed slow while watching TV 2 days ago. He says his head feels foggy, but is not painful. I did a full neuro assessment on the pt at 2050 and he seems to be intact with no noticeable deficits. He states he has not watched TV for the past 2 days but watches videos on his phone and says his head feels fine doing that. I paged Dr. Marlowe Sax to let her know of his concerns. Will continue to monitor.  ?

## 2021-08-30 NOTE — Progress Notes (Signed)
PT Cancellation Note ? ?Patient Details ?Name: Victor Bryan ?MRN: [id number] ?DOB: Mar 11, 1981 ? ? ?Cancelled Treatment:    Reason Eval/Treat Not Completed: (P) Medical issues which prohibited therapy (BP 184/119 above set parameters and per RN SBP dropped to 110 with standing trial when taken before attempt. Pt feeling unwell.) Will continue efforts per PT plan of care as schedule permits.  ? ? ?Leith Szafranski M Aneesha Holloran ?08/30/2021, 5:45 PM ? ? ?

## 2021-08-30 NOTE — Progress Notes (Signed)
?Progress Note ? ?Patient: Victor Bryan GUY:[id number] DOB: [birth year]  ?DOA: 08/24/2021  DOS: 08/30/2021  ?  ?Brief hospital course: ?Deaken Jurgens is a 41 y.o. male with a history of HTN, T2DM, PAF, and stroke in Feb 2023 who presented to the ED with confusion related to hypertensive emergency. He was admitted to the ICU on 5/11 with clevidipine infusion with subsequent improvement in mentation as blood pressure came under control. Home oral medications were restarted and his mental status continues to normalize. Neurology was consulted. EEG monitoring detected no electrographic seizures. MRI brain on admission showed 16mm focus of mild diffusion abnormality in the right paramedian splenium which neurology suspects is unrelated to his current presentation and, though new from prior imaging, felt most likely to be chronic ? ?Assessment and Plan: ? ?Acute hypertensive encephalopathy: Resolved ?Hypertensive emergency: Resolved ?Orthostatic hypotension ongoing ?- BP improved from admission on new regimen with carvedilol, hydralazine(decrease to 25 q8h) ?- Orthostatic symptoms improving - trial stockings and follow symptoms ? ?History of stroke with new diffusion abnormality consistent with interval subacute stroke. ?- Ambulatory referral to neurology ?- Continue eliquis, statin, HTN management as above ? ?PAF: Currently NSR. ?- Continue coreg, diltiazem and eliquis ? ?T2DM: HbA1c 7.2%, well controlled ?- Continue levemir 10u + sensitive novolog SSI (lower than home dose). At inpatient goal. ? ?HLD:  ?- Continue statin. ? ?Headache, history of migraine:  ?- Restarted home fioricet, much improved. ? ?Hypokalemia: Resolved. ? ?Mild AKI on stage IIIb CKD: Cr 3 on admission. Improving to 2.47 with normal UOP.  ?- Would benefit from nephrology following as outpatient.  ?- Recheck BMP in AM. ? ?Fall in hospital: ?- PT evaluation > recommending home health which has been ordered and will be arranged prior to  discharge. ? ?Allergic rhinitis:  ?- Flonase, claritin ? ?OSA:  ?- Not on CPAP ? ?Obesity: Estimated body mass index is 31.47 kg/m? as calculated from the following: ?  Height as of this encounter: 5\' 9"  (1.753 m). ?  Weight as of this encounter: 96.7 kg. ? ?Subjective: Symptoms resolved with rest, continues to complain of lightheadedness dizziness and unspecified vision changes with ambulation ? ?Objective: ?Vitals:  ? 08/30/21 0622 08/30/21 5638 08/30/21 0739 08/30/21 0936  ?BP: (!) 154/91 (!) 157/94 (!) 157/94 (!) 152/87  ?Pulse:  90  88  ?Resp:  17 (!) 21   ?Temp:  98.6 ?F (37 ?C)    ?TempSrc:  Oral    ?SpO2:  100%    ?Weight:      ?Height:      ? ?Gen: 41 y.o. male in no distress ?Pulm: Nonlabored breathing room air. Clear. ?CV: Regular rate and rhythm. No murmur, rub, or gallop. No JVD, no dependent edema. ?GI: Abdomen soft, non-tender, non-distended, with normoactive bowel sounds.  ?Ext: Warm, no deformities ?Skin: No rashes, lesions or ulcers on visualized skin. ?Neuro: Alert and oriented. No focal neurological deficits. ?Psych: Judgement and insight appear fair. Mood euthymic & affect congruent. Behavior is appropriate.   ? ?Data Personally reviewed: ? ?CBC: ?Recent Labs  ?Lab 08/24/21 ?1127 08/24/21 ?1203 08/24/21 ?1204 08/25/21 ?0455 08/27/21 ?0152 08/28/21 ?0051 08/29/21 ?0424  ?WBC 9.3  --   --  11.3* 9.0 7.9 7.2  ?NEUTROABS 5.1  --   --   --   --   --   --   ?HGB 15.0   < > 15.0 13.5 13.4 13.3 13.1  ?HCT 44.7   < > 44.0 39.0 38.7* 38.7* 40.1  ?MCV 76.9*  --   --  76.0* 76.8* 76.6* 78.3*  ?PLT 309  --   --  136* 268 286 272  ? < > = values in this interval not displayed.  ? ? ?Basic Metabolic Panel: ?Recent Labs  ?Lab 08/25/21 ?3557 08/26/21 ?3220 08/27/21 ?2542 08/28/21 ?0051 08/30/21 ?0330  ?NA 138 137 137 136 139  ?K 4.6 3.6 3.4* 3.7 4.1  ?CL 106 109 108 107 110  ?CO2 22 22 23 22 23   ?GLUCOSE 110* 85 125* 191* 131*  ?BUN 23* 29* 22* 21* 17  ?CREATININE 2.75* 2.78* 2.48* 2.47* 2.16*  ?CALCIUM 8.7*  8.3* 8.0* 8.1* 8.5*  ?MG 1.9  --   --   --   --   ?PHOS 4.1  --   --   --   --   ? ? ?GFR: ?Estimated Creatinine Clearance: 51.1 mL/min (A) (by C-G formula based on SCr of 2.16 mg/dL (H)). ?Liver Function Tests: ?Recent Labs  ?Lab 08/24/21 ?1127  ?AST 30  ?ALT 19  ?ALKPHOS 74  ?BILITOT 0.8  ?PROT 6.2*  ?ALBUMIN 2.5*  ? ? ?No results for input(s): LIPASE, AMYLASE in the last 168 hours. ?No results for input(s): AMMONIA in the last 168 hours. ?Coagulation Profile: ?No results for input(s): INR, PROTIME in the last 168 hours. ?Cardiac Enzymes: ?No results for input(s): CKTOTAL, CKMB, CKMBINDEX, TROPONINI in the last 168 hours. ?BNP (last 3 results) ?No results for input(s): PROBNP in the last 8760 hours. ?HbA1C: ?No results for input(s): HGBA1C in the last 72 hours. ? ?CBG: ?Recent Labs  ?Lab 08/29/21 ?1203 08/29/21 ?1610 08/29/21 ?2140 08/30/21 ?0732 08/30/21 ?1138  ?GLUCAP 121* 136* 197* 183* 124*  ? ? ?Lipid Profile: ?No results for input(s): CHOL, HDL, LDLCALC, TRIG, CHOLHDL, LDLDIRECT in the last 72 hours. ? ?Thyroid Function Tests: ?No results for input(s): TSH, T4TOTAL, FREET4, T3FREE, THYROIDAB in the last 72 hours. ?Anemia Panel: ?No results for input(s): VITAMINB12, FOLATE, FERRITIN, TIBC, IRON, RETICCTPCT in the last 72 hours. ?Urine analysis: ?   ?Component Value Date/Time  ? COLORURINE YELLOW 08/24/2021 1330  ? APPEARANCEUR CLEAR 08/24/2021 1330  ? LABSPEC 1.010 08/24/2021 1330  ? PHURINE 7.0 08/24/2021 1330  ? GLUCOSEU 150 (A) 08/24/2021 1330  ? HGBUR SMALL (A) 08/24/2021 1330  ? BILIRUBINUR NEGATIVE 08/24/2021 1330  ? KETONESUR NEGATIVE 08/24/2021 1330  ? PROTEINUR 100 (A) 08/24/2021 1330  ? NITRITE NEGATIVE 08/24/2021 1330  ? LEUKOCYTESUR NEGATIVE 08/24/2021 1330  ? ?Recent Results (from the past 240 hour(s))  ?MRSA Next Gen by PCR, Nasal     Status: None  ? Collection Time: 08/24/21  6:21 PM  ? Specimen: Nasal Mucosa; Nasal Swab  ?Result Value Ref Range Status  ? MRSA by PCR Next Gen NOT DETECTED NOT  DETECTED Final  ?  Comment: (NOTE) ?The GeneXpert MRSA Assay (FDA approved for NASAL specimens only), ?is one component of a comprehensive MRSA colonization surveillance ?program. It is not intended to diagnose MRSA infection nor to guide ?or monitor treatment for MRSA infections. ?Test performance is not FDA approved in patients less than 2 years ?old. ?Performed at [facility], [place] [address] [place], [place] ?[address] ?  ?Culture, blood (Routine X 2) w Reflex to ID Panel     Status: None  ? Collection Time: 08/24/21  6:47 PM  ? Specimen: BLOOD RIGHT HAND  ?Result Value Ref Range Status  ? Specimen Description BLOOD RIGHT HAND  Final  ? Special Requests   Final  ?  BOTTLES DRAWN AEROBIC AND ANAEROBIC  Blood Culture adequate volume  ? Culture   Final  ?  NO GROWTH 5 DAYS ?Performed at [facility], [place] [address], [place], [place] [address] ?  ? Report Status 08/29/2021 FINAL  Final  ?Culture, blood (Routine X 2) w Reflex to ID Panel     Status: None  ? Collection Time: 08/24/21  6:52 PM  ? Specimen: BLOOD LEFT ARM  ?Result Value Ref Range Status  ? Specimen Description BLOOD LEFT ARM  Final  ? Special Requests   Final  ?  BOTTLES DRAWN AEROBIC AND ANAEROBIC Blood Culture adequate volume  ? Culture   Final  ?  NO GROWTH 5 DAYS ?Performed at [facility], [place] [address], [place],  [address] ?  ? Report Status 08/29/2021 FINAL  Final  ?   ? ?Family Communication: None at bedside ? ?Disposition: ?Status is: Inpatient ?Remains inpatient appropriate because: Continue HTN management complicated by orthostatic hypotension. Hopeful to DC home w/HH (ordered) 5/17. CM also working to have patient get appointment with local PCP. ?Planned Discharge Destination: Home ? ? ? ?Holli Humbles DO ? ?08/30/2021 1:43 PM ?Page by secure chat ?

## 2021-08-30 NOTE — Progress Notes (Signed)
Blood pressure checking from sitting to standing. Please review flowsheet for result. BP had significant drop, while standing patient is shaky, lightheaded, and pt states that his head feels heavy. Dr. Avon Gully advised. PRN medications adjusted and advised to continue to monitor.  ?

## 2021-08-30 NOTE — Progress Notes (Signed)
Occupational Therapy Treatment ?Patient Details ?Name: Victor Bryan ?MRN: [id number] ?DOB: November 05, 1980 ?Today's Date: 08/30/2021 ? ? ?History of present illness 41 yo male adm 5/11 with AMS, HTN with SBP in 200s. MRI: 55mm abnormality of R paramedian splenium, most consistent with evolving subacute ischemic infarct. PMHx:R frontal CVA, afib, HLD, HTN, DM II, and OSA. ?  ?OT comments ? Pt able to complete bathing tasks sit to stand at the sink at supervision level as well as functional transfers.  BP in sitting at 130/99 and decreasing to 85/58 and 91/22 in standing with knee high TEDs in place.  Still continues to be limited in standing endurance secondary to hypotension.  Will continue to follow for acute care OT needs to progress to modified independent level.  ? ?Recommendations for follow up therapy are one component of a multi-disciplinary discharge planning process, led by the attending physician.  Recommendations may be updated based on patient status, additional functional criteria and insurance authorization. ?   ?Follow Up Recommendations ? No OT follow up  ?  ?Assistance Recommended at Discharge PRN  ?Patient can return home with the following ? Assist for transportation ?  ?Equipment Recommendations ? None recommended by OT  ?  ?   ?Precautions / Restrictions Precautions ?Precautions: Fall ?Precaution Comments: SBP 150-170, watch BP orthostatic ?Restrictions ?Weight Bearing Restrictions: No  ? ? ?  ? ?Mobility Bed Mobility ?Overal bed mobility: Modified Independent ?Bed Mobility: Supine to Sit ?  ?  ?  ?  ?  ?  ?  ? ?Transfers ?  ?Equipment used: Rolling walker (2 wheels) ?Transfers: Sit to/from Stand ?Sit to Stand: Supervision ?  ?  ?  ?  ?  ?  ?  ?  ?Balance Overall balance assessment: Needs assistance ?  ?Sitting balance-Leahy Scale: Normal ?  ?  ?Standing balance support: No upper extremity supported, During functional activity ?Standing balance-Leahy Scale: Good ?  ?  ?  ?  ?  ?  ?  ?  ?  ?  ?  ?  ?    ? ?ADL either performed or assessed with clinical judgement  ? ?ADL Overall ADL's : Needs assistance/impaired ?  ?  ?Grooming: Wash/dry hands;Standing;Oral care;Supervision/safety ?  ?Upper Body Bathing: Set up;Sitting ?  ?Lower Body Bathing: Supervison/ safety ?  ?Upper Body Dressing : Sitting;Set up ?Upper Body Dressing Details (indicate cue type and reason): hospital gown ?Lower Body Dressing: Supervision/safety ?  ?Toilet Transfer: Supervision/safety;Ambulation;Rolling walker (2 wheels) ?Toilet Transfer Details (indicate cue type and reason): simulated ?Toileting- Clothing Manipulation and Hygiene: Supervision/safety;Sit to/from stand ?Toileting - Clothing Manipulation Details (indicate cue type and reason): simulated ?  ?  ?  ?General ADL Comments: Pt with BP in sitting at 130/99 decreasing to 85/58 with standing and knee high TEDs in place.  HR remained stable in the low 80's.  Pt with some light headedness in standing requiring him to sit down.  He was able to complete bathing and dressing sit to stand at the sink with transfer to the recliner at end of session at supervision level.  BP taken again in standing at 91/72. ?  ? ? ?   ?   ?   ? ?Cognition Arousal/Alertness: Awake/alert ?Behavior During Therapy: Flat affect ?Overall Cognitive Status: Within Functional Limits for tasks assessed ?  ?  ?  ?  ?  ?  ?  ?  ?  ?  ?  ?  ?  ?  ?  ?  ?  ?  ?  ?   ?   ?   ?   ? ? ?  Pertinent Vitals/ Pain       Pain Assessment ?Pain Assessment: No/denies pain ? ?   ?   ? ?Frequency ? Min 2X/week  ? ? ? ? ?  ?Progress Toward Goals ? ?OT Goals(current goals can now be found in the care plan section) ? Progress towards OT goals: Progressing toward goals ? ?Acute Rehab OT Goals ?OT Goal Formulation: With patient/family ?Time For Goal Achievement: 09/11/21 ?Potential to Achieve Goals: Good  ?Plan Discharge plan remains appropriate   ? ?   ?AM-PAC OT "6 Clicks" Daily Activity     ?Outcome Measure ? ? Help from another person eating  meals?: None ?Help from another person taking care of personal grooming?: None ?Help from another person toileting, which includes using toliet, bedpan, or urinal?: A Little ?Help from another person bathing (including washing, rinsing, drying)?: A Little ?Help from another person to put on and taking off regular upper body clothing?: None ?Help from another person to put on and taking off regular lower body clothing?: A Little ?6 Click Score: 21 ? ?  ?End of Session   ? ?OT Visit Diagnosis: Unsteadiness on feet (R26.81);Muscle weakness (generalized) (M62.81) ?  ?Activity Tolerance Patient tolerated treatment well ?  ?Patient Left with call bell/phone within reach;with family/visitor present;in chair;with chair alarm set ?  ?Nurse Communication Mobility status ?  ? ?   ? ?Time: 6967-8938 ?OT Time Calculation (min): 59 min ? ?Charges: OT General Charges ?$OT Visit: 1 Visit ?OT Treatments ?$Self Care/Home Management : 53-67 mins ? ?Mayvis Agudelo OTR/L ?08/30/2021, 2:45 PM ?

## 2021-08-30 NOTE — Discharge Summary (Addendum)
Physician Discharge Summary  Victor Bryan MEQ:[id number] DOB: 1980/06/14 DOA: 08/24/2021  PCP: Pcp, No  Admit date: 08/24/2021 Discharge date: 08/31/2021  Admitted From: Home Disposition: Home  Recommendations for Outpatient Follow-up:  Follow up with PCP in 1-2 weeks  Discharge Condition: Stable CODE STATUS: Full Diet recommendation: Low-salt low-fat diet  Brief/Interim Summary: Victor Bryan is a 41 y.o. male with a history of HTN, T2DM, PAF, and stroke in Feb 2023 who presented to the ED with confusion related to hypertensive emergency. He was admitted to the ICU on 5/11 with clevidipine infusion with subsequent improvement in mentation as blood pressure came under control. Home oral medications were restarted and his mental status continues to normalize. Neurology was consulted. EEG monitoring detected no electrographic seizures. MRI brain on admission showed 84mm focus of mild diffusion abnormality in the right paramedian splenium which neurology suspects is unrelated to his current presentation and, though new from prior imaging, felt most likely to be chronic  Discharge Diagnoses:  Principal Problem:   Hypertensive urgency Active Problems:   Hypertensive emergency   Deviation of downward gaze   Hypertensive encephalopathy  Acute hypertensive encephalopathy: Resolved   Hypertensive emergency: Resolved - Continue home medications. BP improved from admission -Orthostatic hypotension resolving, patient still has profound drop in blood pressure readings but symptoms have been much more manageable over the past 24 hours.  Patient has been running 160/90 at rest, with ambulation today blood pressure dropping as low as 90/60 but again symptoms are markedly improving. -Will likely need closer monitoring in the outpatient setting and medication adjustment slowly over the next few weeks to months.   History of stroke with new diffusion abnormality consistent with interval subacute  stroke. - Ambulatory referral to neurology - Continue eliquis, statin   PAF: Currently NSR. - Continue coreg, diltiazem and eliquis   T2DM: HbA1c 7.2%.  - Continue levemir at discharge. Recommend diabetic diet   HLD:  - Continue statin.   Headache, history of migraine:  - Restarted home fioricet, resolved   Hypokalemia: Resolved.   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 Improving to 2.47 with normal UOP.  - Would benefit from nephrology following as outpatient.  - Recheck BMP in AM.   Fall in hospital: Reportedly a couple days ago.  - PT evaluation > recommending home health which has been ordered and will be arranged prior to discharge.   Allergic rhinitis:  - Flonase, claritin   OSA:  - Not on CPAP   Obesity: Estimated body mass index is 31.57 kg/m as calculated from the following:   Height as of this encounter: 5\' 9"  (1.753 m).   Weight as of this encounter: 97 kg.  Discharge Instructions  Discharge Instructions     Ambulatory referral to Neurology   Complete by: As directed    An appointment is requested in approximately: 6 wks   Discharge patient   Complete by: As directed    Discharge disposition: 01-Home or Self Care   Discharge patient date: 08/31/2021      Allergies as of 08/31/2021       Reactions   Morphine Itching        Medication List     STOP taking these medications    furosemide 20 MG tablet Commonly known as: LASIX   gabapentin 600 MG tablet Commonly known as: NEURONTIN   Levemir FlexTouch 100 UNIT/ML FlexPen Generic drug: insulin detemir Replaced by: insulin detemir 100 UNIT/ML injection   NovoLOG FlexPen 100  UNIT/ML FlexPen Generic drug: insulin aspart   pantoprazole 40 MG tablet Commonly known as: PROTONIX   pravastatin 20 MG tablet Commonly known as: PRAVACHOL   prochlorperazine 10 MG tablet Commonly known as: COMPAZINE       TAKE these medications    apixaban 5 MG Tabs tablet Commonly known as:  ELIQUIS Take 1 tablet (5 mg total) by mouth 2 (two) times daily.   atorvastatin 40 MG tablet Commonly known as: LIPITOR Take 1 tablet (40 mg total) by mouth daily. Start taking on: Sep 01, 2021   butalbital-acetaminophen-caffeine 50-325-40 MG tablet Commonly known as: FIORICET Take 1 tablet by mouth every 8 (eight) hours as needed for headache or migraine.   Bydureon BCise 2 MG/0.85ML Auij Generic drug: Exenatide ER Inject 2 mg into the skin once a week.   carvedilol 12.5 MG tablet Commonly known as: COREG Take 12.5 mg by mouth 2 (two) times daily.   citalopram 40 MG tablet Commonly known as: CELEXA Take 40 mg by mouth daily.   diltiazem 180 MG 24 hr capsule Commonly known as: TIAZAC Take 1 capsule (180 mg total) by mouth daily.   hydrALAZINE 25 MG tablet Commonly known as: APRESOLINE Take 1 tablet (25 mg total) by mouth every 8 (eight) hours. What changed:  medication strength how much to take when to take this   insulin detemir 100 UNIT/ML injection Commonly known as: LEVEMIR Inject 0.15 mLs (15 Units total) into the skin at bedtime. Replaces: Levemir FlexTouch 100 UNIT/ML FlexPen   omeprazole 40 MG capsule Commonly known as: PRILOSEC Take 40 mg by mouth daily.   potassium chloride 10 MEQ tablet Commonly known as: KLOR-CON Take 10 mEq by mouth 2 (two) times daily.   sildenafil 20 MG tablet Commonly known as: REVATIO Take 40-60 mg by mouth daily as needed (erectile dysfunction).               Durable Medical Equipment  (From admission, onward)           Start     Ordered   08/30/21 1113  For home use only DME Shower stool  Once        08/30/21 1113            Follow-up Information     [facility] Follow up.   Why: Physical Therapy-office to call with a visit time. If you need to contact the office please call [phone]               Allergies  Allergen Reactions   Morphine Itching    Procedures/Studies: CT  Head Wo Contrast  Result Date: 08/24/2021 CLINICAL DATA:  Altered mental status EXAM: CT HEAD WITHOUT CONTRAST TECHNIQUE: Contiguous axial images were obtained from the base of the skull through the vertex without intravenous contrast. RADIATION DOSE REDUCTION: This exam was performed according to the departmental dose-optimization program which includes automated exposure control, adjustment of the mA and/or kV according to patient size and/or use of iterative reconstruction technique. COMPARISON:  CT head 08/01/2021 FINDINGS: Brain: There is no acute intracranial hemorrhage, extra-axial fluid collection, or acute infarct. Parenchymal volume is normal. The ventricles are normal in size. Gray-white differentiation is preserved. There is no mass lesion.  There is no mass effect or midline shift. Vascular: No hyperdense vessel or unexpected calcification. Skull: Normal. Negative for fracture or focal lesion. Sinuses/Orbits: There are retention cysts in the right frontal and maxillary sinuses. The globes and orbits are unremarkable. Other: None. IMPRESSION: No  acute intracranial pathology. Electronically Signed   By: Valetta Mole M.D.   On: 08/24/2021 13:58   CT Head Wo Contrast  Result Date: 08/01/2021 CLINICAL DATA:  Sudden onset severe headache, migraine. EXAM: CT HEAD WITHOUT CONTRAST TECHNIQUE: Contiguous axial images were obtained from the base of the skull through the vertex without intravenous contrast. RADIATION DOSE REDUCTION: This exam was performed according to the departmental dose-optimization program which includes automated exposure control, adjustment of the mA and/or kV according to patient size and/or use of iterative reconstruction technique. COMPARISON:  Head CT May 30, 2021 FINDINGS: Brain: No evidence of acute infarction, hemorrhage, hydrocephalus, extra-axial collection or mass lesion/mass effect. Vascular: No hyperdense vessel or unexpected calcification. Skull: Normal. Negative for  fracture or focal lesion. Sinuses/Orbits: Mucous retention cyst in the right maxillary and right frontal sinus. Other: Mastoid air cells are predominantly clear. IMPRESSION: 1. No acute intracranial abnormality. 2. Mucous retention cysts in the right maxillary and right frontal sinus. Electronically Signed   By: Dahlia Bailiff M.D.   On: 08/01/2021 17:51   MR ANGIO HEAD WO CONTRAST  Result Date: 08/25/2021 CLINICAL DATA:  Initial evaluation for delirium. EXAM: MRI HEAD WITHOUT CONTRAST MRA HEAD WITHOUT CONTRAST TECHNIQUE: Multiplanar, multi-echo pulse sequences of the brain and surrounding structures were acquired without intravenous contrast. Angiographic images of the Circle of Willis were acquired using MRA technique without intravenous contrast. COMPARISON:  Prior head CT from 08/24/2021 and MRI from 05/30/2021. FINDINGS: MRI HEAD FINDINGS Brain: Examination degraded by motion artifact. Cerebral volume within normal limits. Mild scattered T2/FLAIR signal abnormality involving the supra tori cerebral white matter, grossly stable from prior, mild in nature. 11 mm focus of mild diffusion abnormality involving the right paramedian splenium (series 5, image 85). Associated T2/FLAIR hyperintensity without ADC correlate. This likely reflects an evolving subacute ischemic infarct. No significant associated blood products or mass effect. No other evidence for acute or subacute ischemia. No parenchymal changes of acute seizure. No mass lesion, midline shift or mass effect. No hydrocephalus or extra-axial fluid collection. Pituitary gland suprasellar region within normal limits. Midline structures intact and normally formed. No intrinsic temporal lobe abnormality. Vascular: Major intracranial vascular flow voids are maintained. Skull and upper cervical spine: Craniocervical junction within normal limits. Diffusely decreased T1 signal intensity seen throughout the bone marrow of the upper cervical spine, nonspecific,  but most commonly related to anemia, smoking, or obesity. No focal marrow replacing lesion. No scalp soft tissue abnormality. Sinuses/Orbits: Globes orbital soft tissues demonstrate no acute finding. Few scattered retention cyst noted within the paranasal sinuses. Trace bilateral mastoid effusions, of doubtful significance. Other: None. MRA HEAD FINDINGS Anterior circulation: Examination degraded by motion artifact. Visualized distal cervical segments of the internal carotid arteries are patent with antegrade flow. Petrous, cavernous, and supraclinoid segments remain widely patent without significant stenosis. A1 segments, anterior knee artery complex common anterior cerebral arteries patent without stenosis. Normal in stenosis or occlusion. Normal MCA bifurcations. No proximal MCA branch occlusion. Distal MCA branches well perfused and symmetric. Posterior circulation: Both vertebral arteries widely patent to the vertebrobasilar junction. Partially visualized PICA patent bilaterally. Basilar patent to its distal aspect without stenosis. Superior cerebellar and posterior cerebral arteries patent bilaterally. Prominent left posterior communicating artery noted. Anatomic variants: None significant.  No intracranial aneurysm. IMPRESSION: MRI HEAD IMPRESSION: 1. Motion degraded exam. 2. 11 mm focus of mild diffusion abnormality involving the right paramedian splenium, most consistent with an evolving subacute ischemic infarct. No associated hemorrhage or mass effect. 3. Mild  cerebral white matter disease, nonspecific, but most commonly related to chronic microvascular ischemic disease, stable. MRA HEAD IMPRESSION: Negative intracranial MRA. No large vessel occlusion, hemodynamically significant stenosis, or other acute vascular abnormality. Electronically Signed   By: Jeannine Boga M.D.   On: 08/25/2021 01:41   MR BRAIN WO CONTRAST  Result Date: 08/25/2021 CLINICAL DATA:  Initial evaluation for delirium.  EXAM: MRI HEAD WITHOUT CONTRAST MRA HEAD WITHOUT CONTRAST TECHNIQUE: Multiplanar, multi-echo pulse sequences of the brain and surrounding structures were acquired without intravenous contrast. Angiographic images of the Circle of Willis were acquired using MRA technique without intravenous contrast. COMPARISON:  Prior head CT from 08/24/2021 and MRI from 05/30/2021. FINDINGS: MRI HEAD FINDINGS Brain: Examination degraded by motion artifact. Cerebral volume within normal limits. Mild scattered T2/FLAIR signal abnormality involving the supra tori cerebral white matter, grossly stable from prior, mild in nature. 11 mm focus of mild diffusion abnormality involving the right paramedian splenium (series 5, image 85). Associated T2/FLAIR hyperintensity without ADC correlate. This likely reflects an evolving subacute ischemic infarct. No significant associated blood products or mass effect. No other evidence for acute or subacute ischemia. No parenchymal changes of acute seizure. No mass lesion, midline shift or mass effect. No hydrocephalus or extra-axial fluid collection. Pituitary gland suprasellar region within normal limits. Midline structures intact and normally formed. No intrinsic temporal lobe abnormality. Vascular: Major intracranial vascular flow voids are maintained. Skull and upper cervical spine: Craniocervical junction within normal limits. Diffusely decreased T1 signal intensity seen throughout the bone marrow of the upper cervical spine, nonspecific, but most commonly related to anemia, smoking, or obesity. No focal marrow replacing lesion. No scalp soft tissue abnormality. Sinuses/Orbits: Globes orbital soft tissues demonstrate no acute finding. Few scattered retention cyst noted within the paranasal sinuses. Trace bilateral mastoid effusions, of doubtful significance. Other: None. MRA HEAD FINDINGS Anterior circulation: Examination degraded by motion artifact. Visualized distal cervical segments of the  internal carotid arteries are patent with antegrade flow. Petrous, cavernous, and supraclinoid segments remain widely patent without significant stenosis. A1 segments, anterior knee artery complex common anterior cerebral arteries patent without stenosis. Normal in stenosis or occlusion. Normal MCA bifurcations. No proximal MCA branch occlusion. Distal MCA branches well perfused and symmetric. Posterior circulation: Both vertebral arteries widely patent to the vertebrobasilar junction. Partially visualized PICA patent bilaterally. Basilar patent to its distal aspect without stenosis. Superior cerebellar and posterior cerebral arteries patent bilaterally. Prominent left posterior communicating artery noted. Anatomic variants: None significant.  No intracranial aneurysm. IMPRESSION: MRI HEAD IMPRESSION: 1. Motion degraded exam. 2. 11 mm focus of mild diffusion abnormality involving the right paramedian splenium, most consistent with an evolving subacute ischemic infarct. No associated hemorrhage or mass effect. 3. Mild cerebral white matter disease, nonspecific, but most commonly related to chronic microvascular ischemic disease, stable. MRA HEAD IMPRESSION: Negative intracranial MRA. No large vessel occlusion, hemodynamically significant stenosis, or other acute vascular abnormality. Electronically Signed   By: Jeannine Boga M.D.   On: 08/25/2021 01:41   DG Chest Portable 1 View  Result Date: 08/24/2021 CLINICAL DATA:  Chest pain. EXAM: PORTABLE CHEST 1 VIEW COMPARISON:  05/30/2021 FINDINGS: The cardiac silhouette, mediastinal and hilar contours are within normal limits given the AP projection and portable technique. Low lung volumes with vascular crowding and streaky atelectasis but no pulmonary infiltrates or pleural effusions. IMPRESSION: Low lung volumes with vascular crowding and streaky atelectasis. Electronically Signed   By: Marijo Sanes M.D.   On: 08/24/2021 18:06   EEG adult  Result Date:  08/24/2021 Lora Havens, MD     08/24/2021  7:10 PM Patient Name: Taiga Lupinacci MRN: [id number] Epilepsy Attending: Lora Havens Referring Physician/Provider: Julian Hy, DO Date: 08/24/2021 Duration: 20.52 mins Patient history: 41yo M patient presents with acute onset altered mental status. EEG to evaluate for seizure Level of alertness: lethargic AEDs during EEG study: None Technical aspects: This EEG study was done with scalp electrodes positioned according to the 10-20 International system of electrode placement. Electrical activity was acquired at a sampling rate of 500Hz  and reviewed with a high frequency filter of 70Hz  and a low frequency filter of 1Hz . EEG data were recorded continuously and digitally stored. Description: EEG showed continuous generalized 2-3Hz  delta slowing. Hyperventilation and photic stimulation were not performed.   ABNORMALITY - Continuous slow, generalized IMPRESSION: This study is suggestive of moderate to severe diffuse encephalopathy, nonspecific etiology. No seizures or epileptiform discharges were seen throughout the recording. Dr Quinn Axe was notified. Priyanka Barbra Sarks   Overnight EEG with video  Result Date: 08/25/2021 Lora Havens, MD     08/26/2021  9:49 AM Patient Name: Akram Kissick MRN: [id number] Epilepsy Attending: Lora Havens Referring Physician/Provider: Julian Hy, DO Duration: 08/24/2021 1830 to 08/25/2021 1830  Patient history: 41yo M patient presents with acute onset altered mental status. EEG to evaluate for seizure  Level of alertness: lethargic  AEDs during EEG study: Ativan  Technical aspects: This EEG study was done with scalp electrodes positioned according to the 10-20 International system of electrode placement. Electrical activity was acquired at a sampling rate of 500Hz  and reviewed with a high frequency filter of 70Hz  and a low frequency filter of 1Hz . EEG data were recorded continuously and digitally stored.  Description: EEG  showed continuous generalized 2-3Hz  delta slowing. Hyperventilation and photic stimulation were not performed. EKG artifact was seen during the study.    ABNORMALITY - Continuous slow, generalized  IMPRESSION: This study is suggestive of moderate to severe diffuse encephalopathy, nonspecific etiology. No seizures or epileptiform discharges were seen throughout the recording.  Priyanka O Yadav   VAS US CAROTID  Result Date: 08/25/2021 Carotid Arterial Duplex Study Patient Name:  AH BOTT  Date of Exam:   08/25/2021 Medical Rec #: [id number]       Accession #:    [id number] Date of Birth: Oct 18, 1980       Patient Gender: M Patient Age:   40 years Exam Location:  [facility] Procedure:      VAS US CAROTID Referring Phys: [facility] --------------------------------------------------------------------------------  Indications:       CVA. Risk Factors:      Hypertension, hyperlipidemia, Diabetes, no history of                    smoking. Limitations        Today's exam was limited due to the high bifurcation of the                    carotid. Comparison Study:  No prior study Performing Technologist: Maudry Mayhew MHA, RDMS, RVT, RDCS  Examination Guidelines: A complete evaluation includes B-mode imaging, spectral Doppler, color Doppler, and power Doppler as needed of all accessible portions of each vessel. Bilateral testing is considered an integral part of a complete examination. Limited examinations for reoccurring indications may be performed as noted.  Right Carotid Findings: +----------+--------+--------+--------+------------------+------------------+           PSV cm/sEDV cm/sStenosisPlaque DescriptionComments           +----------+--------+--------+--------+------------------+------------------+  CCA Prox  116     20                                                   +----------+--------+--------+--------+------------------+------------------+ CCA Distal116     24                                 intimal thickening +----------+--------+--------+--------+------------------+------------------+ ICA Prox  136     23                                                   +----------+--------+--------+--------+------------------+------------------+ ICA Distal43      15                                                   +----------+--------+--------+--------+------------------+------------------+ ECA       123     14                                                   +----------+--------+--------+--------+------------------+------------------+ +----------+--------+-------+----------------+-------------------+           PSV cm/sEDV cmsDescribe        Arm Pressure (mmHG) +----------+--------+-------+----------------+-------------------+ Subclavian               Multiphasic, WNL                    +----------+--------+-------+----------------+-------------------+ +---------+--------+--+--------+-+---------+ VertebralPSV cm/s42EDV cm/s9Antegrade +---------+--------+--+--------+-+---------+  Left Carotid Findings: +----------+--------+--------+--------+------------------+--------+           PSV cm/sEDV cm/sStenosisPlaque DescriptionComments +----------+--------+--------+--------+------------------+--------+ CCA Prox  169     25                                         +----------+--------+--------+--------+------------------+--------+ CCA Distal127     21                                         +----------+--------+--------+--------+------------------+--------+ ICA Prox  73      16                                         +----------+--------+--------+--------+------------------+--------+ ICA Distal96      25                                         +----------+--------+--------+--------+------------------+--------+ ECA       159     28                                          +----------+--------+--------+--------+------------------+--------+ +----------+--------+--------+--------------+-------------------+  PSV cm/sEDV cm/sDescribe      Arm Pressure (mmHG) +----------+--------+--------+--------------+-------------------+ Subclavian                Not identified                    +----------+--------+--------+--------------+-------------------+ +---------+--------+--+--------+--+---------+ VertebralPSV cm/s64EDV cm/s17Antegrade +---------+--------+--+--------+--+---------+   Summary: Right Carotid: The extracranial vessels were near-normal with only minimal wall                thickening or plaque. Left Carotid: The extracranial vessels were near-normal with only minimal wall               thickening or plaque. Vertebrals:  Bilateral vertebral arteries demonstrate antegrade flow. Subclavians: Left subclavian artery was not visualized. Normal flow hemodynamics              were seen in the right subclavian artery. *See table(s) above for measurements and observations.  Electronically signed by Antony Contras MD on 08/25/2021 at 4:33:43 PM.    Final      Subjective: No acute issues or events overnight   Discharge Exam: Vitals:   08/31/21 0615 08/31/21 1012  BP: (!) 158/105   Pulse: 94   Resp: 20   Temp: 98.5 F (36.9 C)   SpO2: 100% 100%   Vitals:   08/30/21 2116 08/30/21 2351 08/31/21 0615 08/31/21 1012  BP: (!) 177/102 (!) 154/101 (!) 158/105   Pulse:  90 94   Resp: 16 16 20    Temp:  98.5 F (36.9 C) 98.5 F (36.9 C)   TempSrc:  Oral Oral   SpO2:   100% 100%  Weight:   96.3 kg   Height:        General: Pt is alert, awake, not in acute distress Cardiovascular: RRR, S1/S2 +, no rubs, no gallops Respiratory: CTA bilaterally, no wheezing, no rhonchi Abdominal: Soft, NT, ND, bowel sounds + Extremities: no edema, no cyanosis    The results of significant diagnostics from this hospitalization (including imaging, microbiology,  ancillary and laboratory) are listed below for reference.     Microbiology: Recent Results (from the past 240 hour(s))  MRSA Next Gen by PCR, Nasal     Status: None   Collection Time: 08/24/21  6:21 PM   Specimen: Nasal Mucosa; Nasal Swab  Result Value Ref Range Status   MRSA by PCR Next Gen NOT DETECTED NOT DETECTED Final    Comment: (NOTE) The GeneXpert MRSA Assay (FDA approved for NASAL specimens only), is one component of a comprehensive MRSA colonization surveillance program. It is not intended to diagnose MRSA infection nor to guide or monitor treatment for MRSA infections. Test performance is not FDA approved in patients less than 61 years old. Performed at [facility], [place] [address] [place], [place] [address]   Culture, blood (Routine X 2) w Reflex to ID Panel     Status: None   Collection Time: 08/24/21  6:47 PM   Specimen: BLOOD RIGHT HAND  Result Value Ref Range Status   Specimen Description BLOOD RIGHT HAND  Final   Special Requests   Final    BOTTLES DRAWN AEROBIC AND ANAEROBIC Blood Culture adequate volume   Culture   Final    NO GROWTH 5 DAYS Performed at [facility], [place] [address] [place], [place] [address]    Report Status 08/29/2021 FINAL  Final  Culture, blood (Routine X 2) w Reflex to ID Panel     Status: None  Collection Time: 08/24/21  6:52 PM   Specimen: BLOOD LEFT ARM  Result Value Ref Range Status   Specimen Description BLOOD LEFT ARM  Final   Special Requests   Final    BOTTLES DRAWN AEROBIC AND ANAEROBIC Blood Culture adequate volume   Culture   Final    NO GROWTH 5 DAYS Performed at [facility], [address] [place], [place] [address]    Report Status 08/29/2021 FINAL  Final     Labs: BNP (last 3 results) No results for input(s): BNP in the last 8760 hours. Basic Metabolic Panel: Recent Labs  Lab 08/25/21 0455 08/26/21 0605 08/27/21 0152 08/28/21 0051 08/30/21 0330  NA 138 137 137 136 139  K 4.6 3.6 3.4*  3.7 4.1  CL 106 109 108 107 110  CO2 22 22 23 22 23   GLUCOSE 110* 85 125* 191* 131*  BUN 23* 29* 22* 21* 17  CREATININE 2.75* 2.78* 2.48* 2.47* 2.16*  CALCIUM 8.7* 8.3* 8.0* 8.1* 8.5*  MG 1.9  --   --   --   --   PHOS 4.1  --   --   --   --    Liver Function Tests: No results for input(s): AST, ALT, ALKPHOS, BILITOT, PROT, ALBUMIN in the last 168 hours.  No results for input(s): LIPASE, AMYLASE in the last 168 hours. No results for input(s): AMMONIA in the last 168 hours. CBC: Recent Labs  Lab 08/25/21 0455 08/27/21 0152 08/28/21 0051 08/29/21 0424  WBC 11.3* 9.0 7.9 7.2  HGB 13.5 13.4 13.3 13.1  HCT 39.0 38.7* 38.7* 40.1  MCV 76.0* 76.8* 76.6* 78.3*  PLT 136* 268 286 272   Cardiac Enzymes: No results for input(s): CKTOTAL, CKMB, CKMBINDEX, TROPONINI in the last 168 hours. BNP: Invalid input(s): POCBNP CBG: Recent Labs  Lab 08/30/21 1138 08/30/21 1650 08/30/21 2230 08/31/21 0739 08/31/21 1129  GLUCAP 124* 156* 166* 160* 211*   D-Dimer No results for input(s): DDIMER in the last 72 hours. Hgb A1c No results for input(s): HGBA1C in the last 72 hours. Lipid Profile No results for input(s): CHOL, HDL, LDLCALC, TRIG, CHOLHDL, LDLDIRECT in the last 72 hours. Thyroid function studies No results for input(s): TSH, T4TOTAL, T3FREE, THYROIDAB in the last 72 hours.  Invalid input(s): FREET3 Anemia work up No results for input(s): VITAMINB12, FOLATE, FERRITIN, TIBC, IRON, RETICCTPCT in the last 72 hours. Urinalysis    Component Value Date/Time   COLORURINE YELLOW 08/24/2021 1330   APPEARANCEUR CLEAR 08/24/2021 1330   LABSPEC 1.010 08/24/2021 1330   PHURINE 7.0 08/24/2021 1330   GLUCOSEU 150 (A) 08/24/2021 1330   HGBUR SMALL (A) 08/24/2021 1330   BILIRUBINUR NEGATIVE 08/24/2021 1330   KETONESUR NEGATIVE 08/24/2021 1330   PROTEINUR 100 (A) 08/24/2021 1330   NITRITE NEGATIVE 08/24/2021 1330   LEUKOCYTESUR NEGATIVE 08/24/2021 1330   Sepsis Labs Invalid input(s):  PROCALCITONIN,  WBC,  LACTICIDVEN Microbiology Recent Results (from the past 240 hour(s))  MRSA Next Gen by PCR, Nasal     Status: None   Collection Time: 08/24/21  6:21 PM   Specimen: Nasal Mucosa; Nasal Swab  Result Value Ref Range Status   MRSA by PCR Next Gen NOT DETECTED NOT DETECTED Final    Comment: (NOTE) The GeneXpert MRSA Assay (FDA approved for NASAL specimens only), is one component of a comprehensive MRSA colonization surveillance program. It is not intended to diagnose MRSA infection nor to guide or monitor treatment for MRSA infections. Test performance is not  FDA approved in patients less than 49 years old. Performed at [facility], [place] [address] [place], [place] [address]   Culture, blood (Routine X 2) w Reflex to ID Panel     Status: None   Collection Time: 08/24/21  6:47 PM   Specimen: BLOOD RIGHT HAND  Result Value Ref Range Status   Specimen Description BLOOD RIGHT HAND  Final   Special Requests   Final    BOTTLES DRAWN AEROBIC AND ANAEROBIC Blood Culture adequate volume   Culture   Final    NO GROWTH 5 DAYS Performed at [facility], [place] [address], [place], [place] [address]    Report Status 08/29/2021 FINAL  Final  Culture, blood (Routine X 2) w Reflex to ID Panel     Status: None   Collection Time: 08/24/21  6:52 PM   Specimen: BLOOD LEFT ARM  Result Value Ref Range Status   Specimen Description BLOOD LEFT ARM  Final   Special Requests   Final    BOTTLES DRAWN AEROBIC AND ANAEROBIC Blood Culture adequate volume   Culture   Final    NO GROWTH 5 DAYS Performed at [facility], [place] [address] [place], [place] [address]    Report Status 08/29/2021 FINAL  Final     Time coordinating discharge: Over 30 minutes  SIGNED:   Little Ishikawa, DO [facility] 08/31/2021, 12:52 PM Pager   If 7PM-7AM, please contact night-coverage [email or web address]

## 2021-08-31 LAB — GLUCOSE, CAPILLARY
Glucose-Capillary: 160 mg/dL — ABNORMAL HIGH (ref 70–99)
Glucose-Capillary: 211 mg/dL — ABNORMAL HIGH (ref 70–99)

## 2021-08-31 MED ORDER — INSULIN DETEMIR 100 UNIT/ML ~~LOC~~ SOLN: 15.0000 [IU] | Freq: Every day | SUBCUTANEOUS | 0 refills | Status: DC

## 2021-08-31 MED ORDER — HYDRALAZINE HCL 25 MG PO TABS: 25.0000 mg | ORAL_TABLET | Freq: Three times a day (TID) | ORAL | 0 refills | Status: AC

## 2021-08-31 MED ORDER — APIXABAN 5 MG PO TABS: 5.0000 mg | ORAL_TABLET | Freq: Two times a day (BID) | ORAL | 0 refills | Status: AC

## 2021-08-31 MED ORDER — DILTIAZEM HCL ER BEADS 180 MG PO CP24
180.0000 mg | ORAL_CAPSULE | Freq: Every day | ORAL | 0 refills | Status: AC
Start: 2021-08-31 — End: ?

## 2021-08-31 MED ORDER — ATORVASTATIN CALCIUM 40 MG PO TABS
40.0000 mg | ORAL_TABLET | Freq: Every day | ORAL | 0 refills | Status: AC
Start: 2021-09-01 — End: ?

## 2021-08-31 NOTE — Progress Notes (Signed)
Physical Therapy Treatment Patient Details Name: Victor Bryan MRN: [id number] DOB: 1980/11/04 Today's Date: 08/31/2021   History of Present Illness 41 yo male adm 5/11 with AMS, HTN with SBP in 200s. MRI: 73mm abnormality of R paramedian splenium, most consistent with evolving subacute ischemic infarct. PMHx:R frontal CVA, afib, HLD, HTN, DM II, and OSA.    PT Comments    Pt supine and reports limited activity acutely and still feeling fatigued throughout the day. Pt with continued drop in BP with activity but able to tolerate in room gait and functional mobility. Pt educated for need to mobilize throughout the day, remain sitting and continue to allow BP to rebound with mobility, wear of TED hose.   Supine 164/103 (122) HR 92 Sitting 135/88 (104) performed seated bil LE and UE HEP prior to standing Standing 102/66 (74) HR 101 Standing 3 min 88/52 (64)  Return to sitting after standing 125/87 (98) Gait after walking 30' in sitting 129/90 Gait after 44' with increased time to take 131/98   Recommendations for follow up therapy are one component of a multi-disciplinary discharge planning process, led by the attending physician.  Recommendations may be updated based on patient status, additional functional criteria and insurance authorization.  Follow Up Recommendations  Home health PT     Assistance Recommended at Discharge Intermittent Supervision/Assistance  Patient can return home with the following Assistance with cooking/housework;Assist for transportation;Help with stairs or ramp for entrance   Equipment Recommendations  None recommended by PT    Recommendations for Other Services       Precautions / Restrictions Precautions Precautions: Fall Precaution Comments: SBP 150-170, watch BP orthostatic     Mobility  Bed Mobility Overal bed mobility: Independent Bed Mobility: Supine to Sit, Sit to Supine     Supine to sit: Independent Sit to supine: Independent         Transfers Overall transfer level: Modified independent                 General transfer comment: pt able to perform transfers without assist, supervision to monitor BP    Ambulation/Gait Ambulation/Gait assistance: Supervision Gait Distance (Feet): 90 Feet Assistive device: None Gait Pattern/deviations: WFL(Within Functional Limits)   Gait velocity interpretation: >2.62 ft/sec, indicative of community ambulatory   General Gait Details: pt able to walk 30' and 90' without symptoms of lightheadedness. Initial BP after 30'  129/90. Required 4 attempts to read BP after 2nd gait of 90' with 131/98   Stairs             Wheelchair Mobility    Modified Rankin (Stroke Patients Only)       Balance Overall balance assessment: No apparent balance deficits (not formally assessed)                                          Cognition Arousal/Alertness: Awake/alert Behavior During Therapy: Flat affect Overall Cognitive Status: Within Functional Limits for tasks assessed                                          Exercises General Exercises - Upper Extremity Shoulder Flexion: AROM, Both, Seated, 20 reps General Exercises - Lower Extremity Long Arc Quad: AROM, Both, Seated, 20 reps Hip Flexion/Marching: AROM, Both, 20 reps, Seated  General Comments        Pertinent Vitals/Pain Pain Assessment Pain Assessment: No/denies pain    Home Living                          Prior Function            PT Goals (current goals can now be found in the care plan section) Progress towards PT goals: Progressing toward goals    Frequency    Min 3X/week      PT Plan Current plan remains appropriate    Co-evaluation              AM-PAC PT "6 Clicks" Mobility   Outcome Measure  Help needed turning from your back to your side while in a flat bed without using bedrails?: None Help needed moving from lying on your  back to sitting on the side of a flat bed without using bedrails?: None Help needed moving to and from a bed to a chair (including a wheelchair)?: None Help needed standing up from a chair using your arms (e.g., wheelchair or bedside chair)?: None Help needed to walk in hospital room?: A Little Help needed climbing 3-5 steps with a railing? : A Little 6 Click Score: 22    End of Session   Activity Tolerance: Patient tolerated treatment well Patient left: in chair;with call bell/phone within reach;with chair alarm set Nurse Communication: Mobility status PT Visit Diagnosis: Unsteadiness on feet (R26.81);Other abnormalities of gait and mobility (R26.89)     Time: 5726-2035 PT Time Calculation (min) (ACUTE ONLY): 29 min  Charges:  $Gait Training: 8-22 mins $Therapeutic Activity: 8-22 mins                     Philamena Kramar P, PT Acute Rehabilitation Services Pager: [phone] Office: [place] 08/31/2021, 10:17 AM

## 2021-08-31 NOTE — TOC Progression Note (Signed)
Transition of Care [facility]) - Progression Note    Patient Details  Name: Victor Bryan MRN: [id number] Date of Birth: 11-03-1980  Transition of [facility] CM/SW Contact  [name], [name], RN Phone Number: 08/31/2021, 11:54 AM  Clinical Narrative: Case Manager spoke with [facility] and the office will have to manage home health orders until the patients first appointment on 09-07-21 @ 1:15 pm. No further needs from Case Manager at this time.    Expected Discharge Plan: [place] Barriers to Discharge: No Barriers Identified  Expected Discharge Plan and Services Expected Discharge Plan: [place] In-house Referral: NA Discharge Planning Services: CM Consult Post Acute Care Choice: Home Health                   DME Arranged: Shower stool DME Agency: [employer] Date DME Agency Contacted: 08/28/21 Time DME Agency Contacted: 1640 Representative spoke with at DME Agency: Mardene Celeste HH Arranged: PT [place]: [place] ([place]) Date [place]: 08/28/21 Time [place]: [phone] Representative spoke with at [place]: Corene Cornea    Readmission Risk Interventions    08/28/2021    4:43 PM  Readmission Risk Prevention Plan  Transportation Screening Complete  PCP or Specialist Appt within 3-5 Days Complete  HRI or [place] Complete  Social Work Consult for [place] Planning/Counseling Complete  Palliative Care Screening Not Applicable  Medication Review Press photographer) Complete

## 2021-08-31 NOTE — Care Management Important Message (Signed)
Important Message  Patient Details  Name: Victor Bryan MRN: [id number] Date of Birth: 1980-06-06   Medicare Important Message Given:  Yes     [name] 08/31/2021, 9:30 AM

## 2021-08-31 NOTE — Progress Notes (Signed)
Went over discharge paper work with patient. All questions answered, PIV removed, all belongings at bedside.

## 2021-09-07 ENCOUNTER — Ambulatory Visit: Payer: Medicare (Managed Care) | Admitting: Internal Medicine

## 2021-09-07 ENCOUNTER — Telehealth: Payer: Self-pay | Admitting: *Deleted

## 2021-09-07 NOTE — Telephone Encounter (Signed)
Called patient left voice message for patient regarding the missed appointment/ patient needs to call the main number to reschedule [phone].

## 2021-09-29 ENCOUNTER — Other Ambulatory Visit: Payer: Self-pay

## 2021-09-29 ENCOUNTER — Inpatient Hospital Stay (HOSPITAL_COMMUNITY)
Admission: EM | Admit: 2021-09-29 | Discharge: 2021-10-03 | DRG: 445 | Disposition: A | Discharge status: Home / Self Care | Payer: Medicare (Managed Care) | Attending: Internal Medicine | Admitting: Internal Medicine

## 2021-09-29 ENCOUNTER — Encounter (HOSPITAL_COMMUNITY): Payer: Self-pay

## 2021-09-29 ENCOUNTER — Emergency Department (HOSPITAL_COMMUNITY): Payer: Medicare (Managed Care)

## 2021-09-29 DIAGNOSIS — E1122 Type 2 diabetes mellitus with diabetic chronic kidney disease: Secondary | ICD-10-CM | POA: Diagnosis present

## 2021-09-29 DIAGNOSIS — Z833 Family history of diabetes mellitus: Secondary | ICD-10-CM

## 2021-09-29 DIAGNOSIS — Z885 Allergy status to narcotic agent status: Secondary | ICD-10-CM | POA: Diagnosis not present

## 2021-09-29 DIAGNOSIS — N1832 Chronic kidney disease, stage 3b: Secondary | ICD-10-CM | POA: Diagnosis present

## 2021-09-29 DIAGNOSIS — I480 Paroxysmal atrial fibrillation: Secondary | ICD-10-CM | POA: Diagnosis present

## 2021-09-29 DIAGNOSIS — Z8673 Personal history of transient ischemic attack (TIA), and cerebral infarction without residual deficits: Secondary | ICD-10-CM | POA: Diagnosis not present

## 2021-09-29 DIAGNOSIS — R634 Abnormal weight loss: Secondary | ICD-10-CM | POA: Diagnosis present

## 2021-09-29 DIAGNOSIS — I129 Hypertensive chronic kidney disease with stage 1 through stage 4 chronic kidney disease, or unspecified chronic kidney disease: Secondary | ICD-10-CM | POA: Diagnosis present

## 2021-09-29 DIAGNOSIS — E872 Acidosis, unspecified: Secondary | ICD-10-CM | POA: Diagnosis present

## 2021-09-29 DIAGNOSIS — Z8249 Family history of ischemic heart disease and other diseases of the circulatory system: Secondary | ICD-10-CM | POA: Diagnosis not present

## 2021-09-29 DIAGNOSIS — K8020 Calculus of gallbladder without cholecystitis without obstruction: Secondary | ICD-10-CM | POA: Diagnosis present

## 2021-09-29 DIAGNOSIS — G4733 Obstructive sleep apnea (adult) (pediatric): Secondary | ICD-10-CM | POA: Diagnosis present

## 2021-09-29 DIAGNOSIS — E785 Hyperlipidemia, unspecified: Secondary | ICD-10-CM | POA: Diagnosis present

## 2021-09-29 DIAGNOSIS — I10 Essential (primary) hypertension: Secondary | ICD-10-CM | POA: Diagnosis not present

## 2021-09-29 DIAGNOSIS — K8050 Calculus of bile duct without cholangitis or cholecystitis without obstruction: Principal | ICD-10-CM

## 2021-09-29 DIAGNOSIS — K8063 Calculus of gallbladder and bile duct with acute cholecystitis with obstruction: Secondary | ICD-10-CM | POA: Diagnosis not present

## 2021-09-29 DIAGNOSIS — F32A Depression, unspecified: Secondary | ICD-10-CM | POA: Diagnosis present

## 2021-09-29 DIAGNOSIS — I160 Hypertensive urgency: Secondary | ICD-10-CM | POA: Diagnosis present

## 2021-09-29 DIAGNOSIS — G8929 Other chronic pain: Secondary | ICD-10-CM | POA: Diagnosis present

## 2021-09-29 DIAGNOSIS — E669 Obesity, unspecified: Secondary | ICD-10-CM | POA: Diagnosis present

## 2021-09-29 DIAGNOSIS — E118 Type 2 diabetes mellitus with unspecified complications: Secondary | ICD-10-CM | POA: Diagnosis not present

## 2021-09-29 DIAGNOSIS — Z6831 Body mass index (BMI) 31.0-31.9, adult: Secondary | ICD-10-CM

## 2021-09-29 DIAGNOSIS — Z794 Long term (current) use of insulin: Secondary | ICD-10-CM

## 2021-09-29 DIAGNOSIS — Z7901 Long term (current) use of anticoagulants: Secondary | ICD-10-CM

## 2021-09-29 DIAGNOSIS — Z79899 Other long term (current) drug therapy: Secondary | ICD-10-CM

## 2021-09-29 DIAGNOSIS — N179 Acute kidney failure, unspecified: Secondary | ICD-10-CM | POA: Diagnosis present

## 2021-09-29 HISTORY — DX: Cerebral infarction, unspecified: I63.9

## 2021-09-29 LAB — CBC WITH DIFFERENTIAL/PLATELET
Abs Immature Granulocytes: 0.03 K/uL (ref 0.00–0.07)
Basophils Absolute: 0.1 K/uL (ref 0.0–0.1)
Basophils Relative: 1 %
Eosinophils Absolute: 0.1 K/uL (ref 0.0–0.5)
Eosinophils Relative: 1 %
HCT: 41.4 % (ref 39.0–52.0)
Hemoglobin: 13.9 g/dL (ref 13.0–17.0)
Immature Granulocytes: 0 %
Lymphocytes Relative: 27 %
Lymphs Abs: 2.4 K/uL (ref 0.7–4.0)
MCH: 25.9 pg — ABNORMAL LOW (ref 26.0–34.0)
MCHC: 33.6 g/dL (ref 30.0–36.0)
MCV: 77.1 fL — ABNORMAL LOW (ref 80.0–100.0)
Monocytes Absolute: 0.6 K/uL (ref 0.1–1.0)
Monocytes Relative: 6 %
Neutro Abs: 5.8 K/uL (ref 1.7–7.7)
Neutrophils Relative %: 65 %
Platelets: 284 K/uL (ref 150–400)
RBC: 5.37 MIL/uL (ref 4.22–5.81)
RDW: 14 % (ref 11.5–15.5)
WBC: 9.1 K/uL (ref 4.0–10.5)
nRBC: 0 % (ref 0.0–0.2)

## 2021-09-29 LAB — URINALYSIS, ROUTINE W REFLEX MICROSCOPIC
Bacteria, UA: NONE SEEN
Bilirubin Urine: NEGATIVE
Glucose, UA: 150 mg/dL — AB
Hgb urine dipstick: NEGATIVE
Ketones, ur: NEGATIVE mg/dL
Leukocytes,Ua: NEGATIVE
Nitrite: NEGATIVE
Protein, ur: >=300 mg/dL — AB
Specific Gravity, Urine: 1.015 (ref 1.005–1.030)
pH: 6 (ref 5.0–8.0)

## 2021-09-29 LAB — COMPREHENSIVE METABOLIC PANEL WITH GFR
ALT: 14 U/L (ref 0–44)
AST: 17 U/L (ref 15–41)
Albumin: 2.7 g/dL — ABNORMAL LOW (ref 3.5–5.0)
Alkaline Phosphatase: 62 U/L (ref 38–126)
Anion gap: 6 (ref 5–15)
BUN: 21 mg/dL — ABNORMAL HIGH (ref 6–20)
CO2: 21 mmol/L — ABNORMAL LOW (ref 22–32)
Calcium: 8.9 mg/dL (ref 8.9–10.3)
Chloride: 111 mmol/L (ref 98–111)
Creatinine, Ser: 2.02 mg/dL — ABNORMAL HIGH (ref 0.61–1.24)
GFR, Estimated: 41 mL/min — ABNORMAL LOW (ref >60)
Glucose, Bld: 219 mg/dL — ABNORMAL HIGH (ref 70–99)
Potassium: 4.3 mmol/L (ref 3.5–5.1)
Sodium: 138 mmol/L (ref 135–145)
Total Bilirubin: 0.5 mg/dL (ref 0.3–1.2)
Total Protein: 6.1 g/dL — ABNORMAL LOW (ref 6.5–8.1)

## 2021-09-29 LAB — LIPASE, BLOOD: Lipase: 25 U/L (ref 11–51)

## 2021-09-29 IMAGING — US US ABDOMEN LIMITED
1 series · 15 of 25 positions shown · non-contrast
Comparison: None Available.

CLINICAL DATA: [83].  Right upper quadrant pain.

EXAM:
ULTRASOUND ABDOMEN LIMITED RIGHT UPPER QUADRANT

[Series 1: us abdomen limited ruq mc & wl · 15 of 46 slices shown]
[im 1/46]
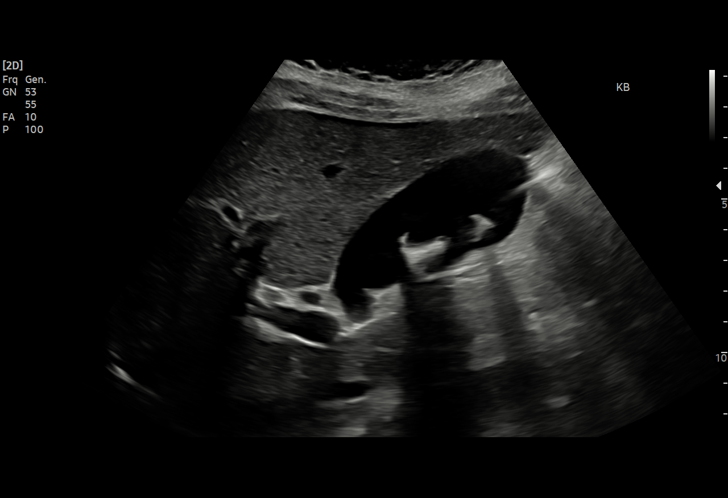
[im 4/46]
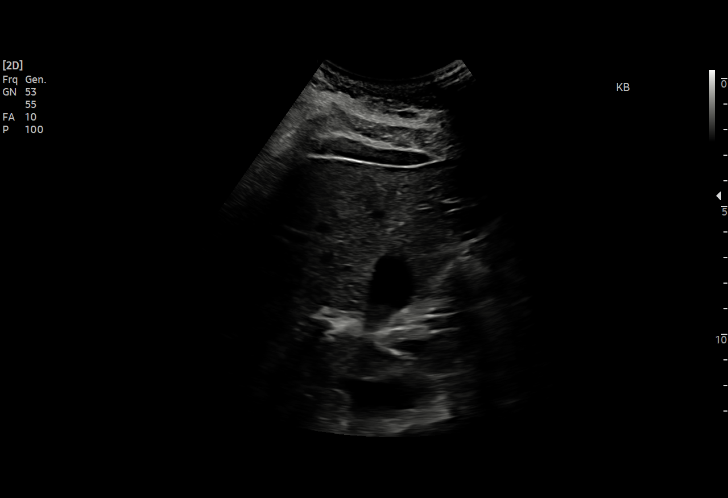
[im 8/46]
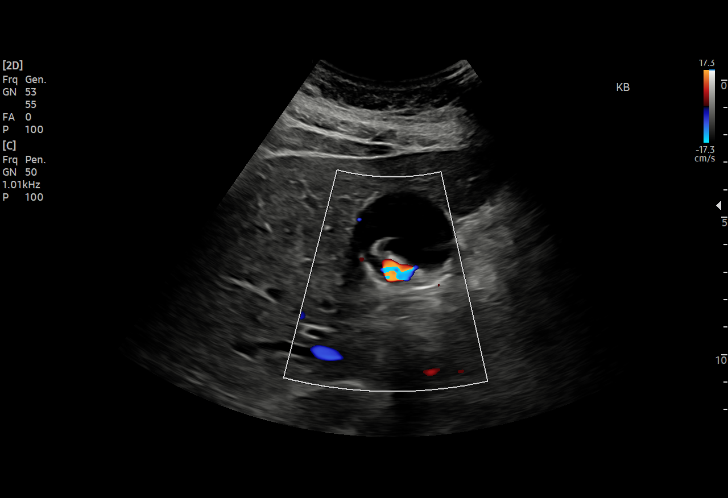
[im 10/46]
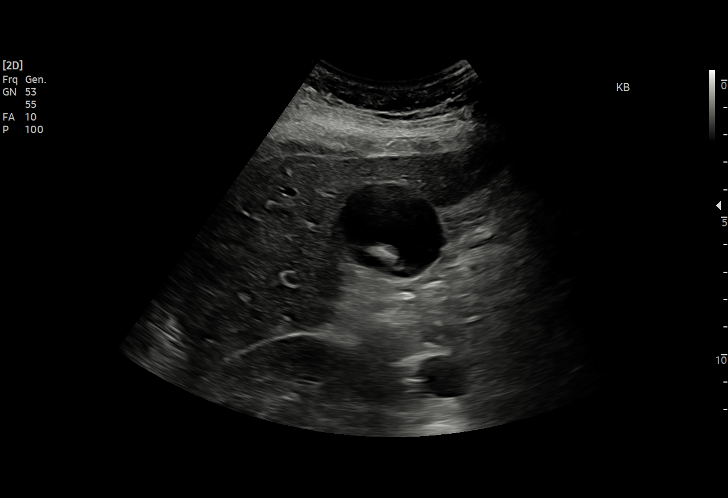
[im 14/46]
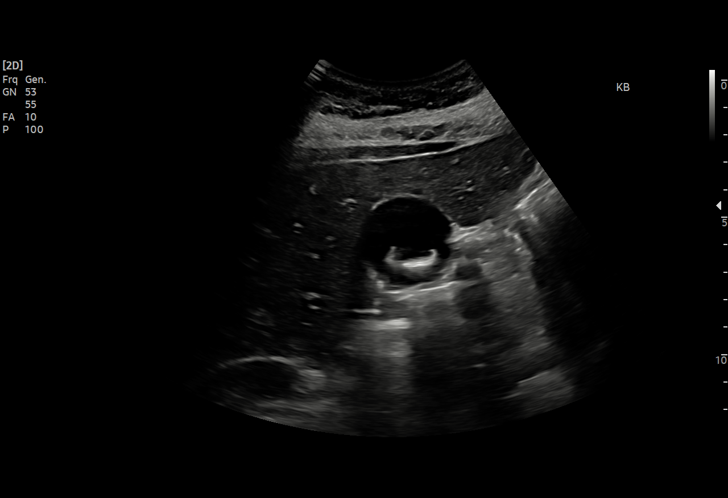
[im 17/46]
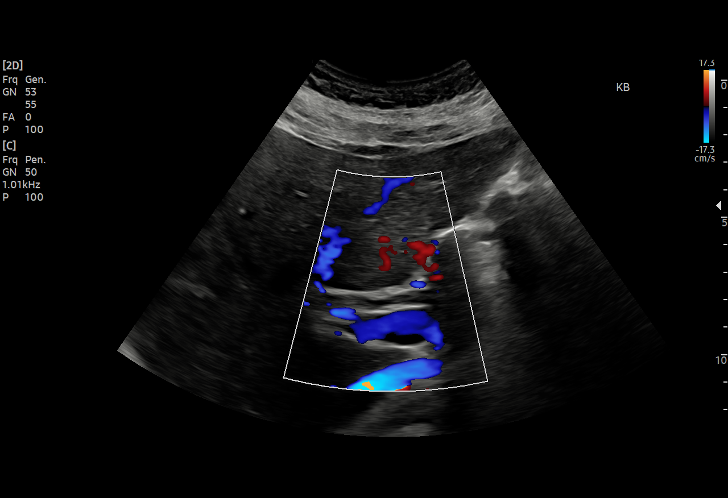
[im 19/46]
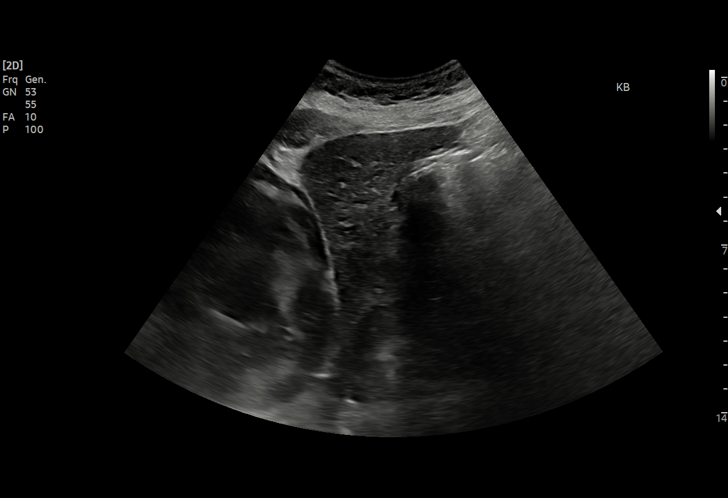
[im 23/46]
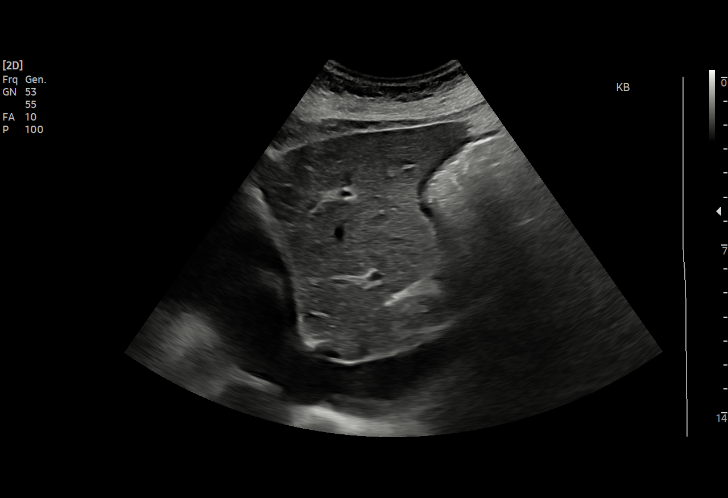
[im 27/46]
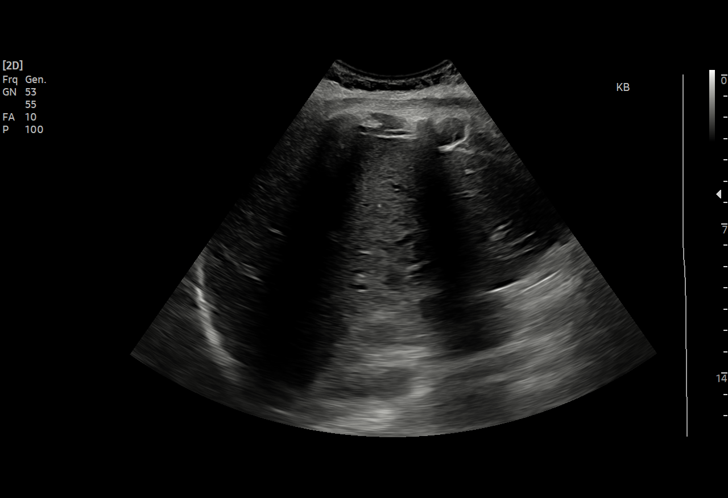
[im 29/46]
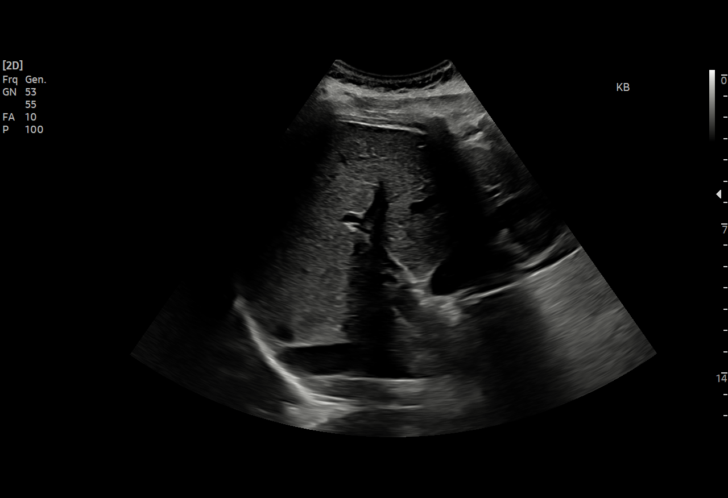
[im 32/46]
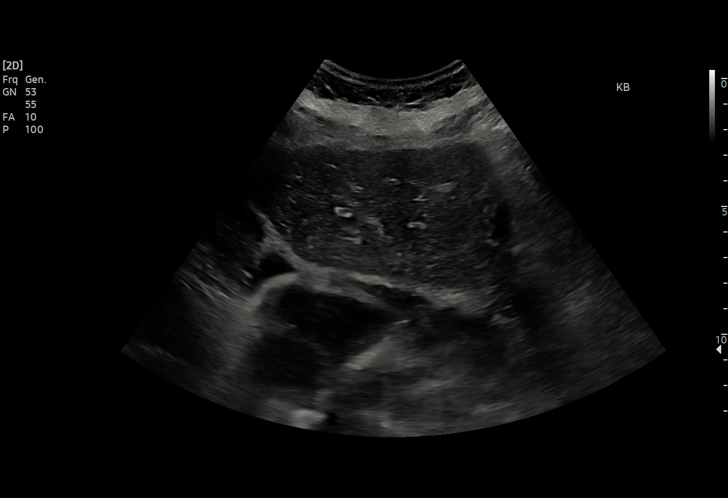
[im 36/46]
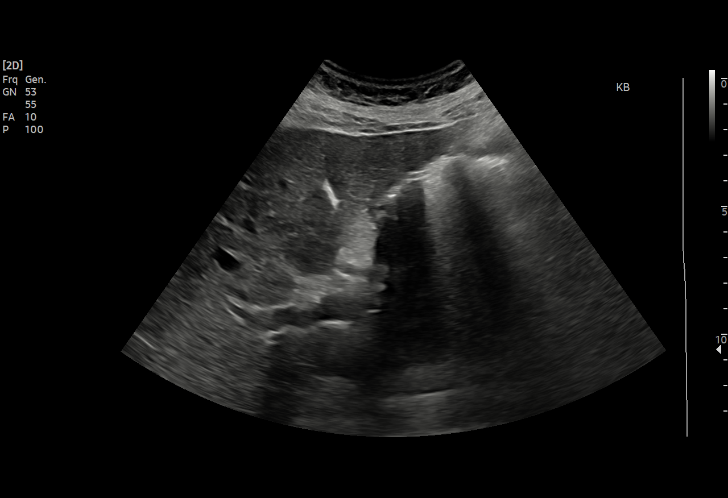
[im 38/46]
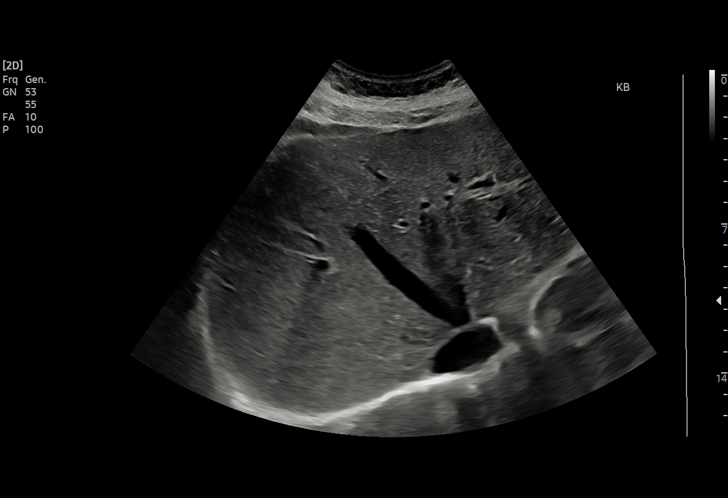
[im 42/46]
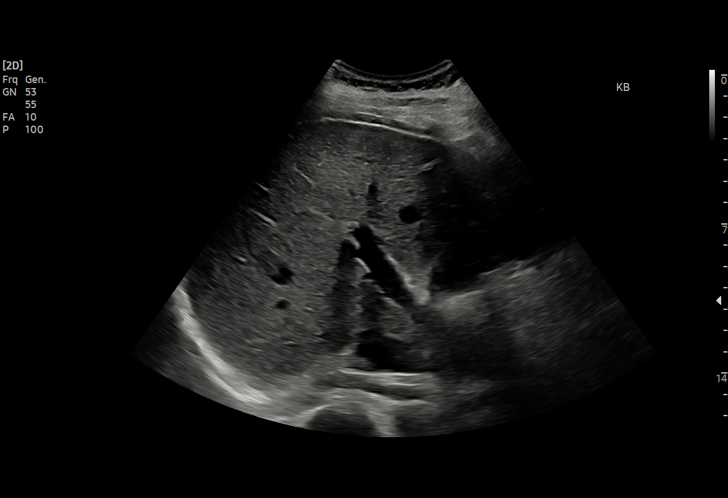
[im 46/46]
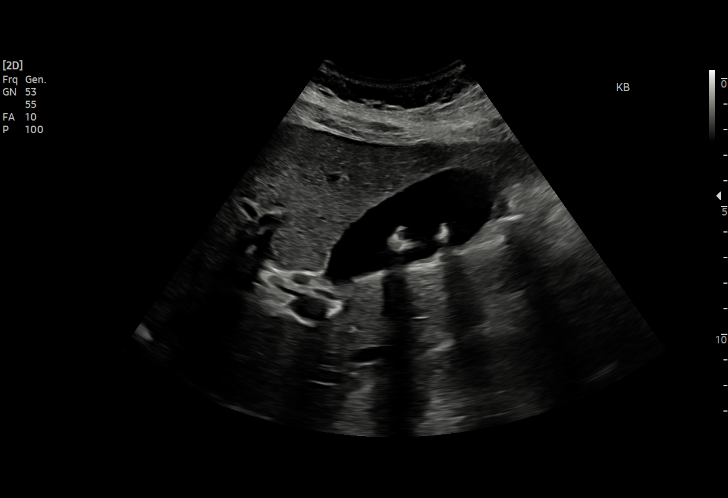

[15 of 25 positions shown; findings below may reference images not displayed]

FINDINGS: Gallbladder:

Multiple gallstones noted within the gallbladder lumen. No
gallbladder wall thickening visualized. No pericholecystic fluid.
Positive sonographic Murphy sign noted by sonographer.

Common bile duct:

Diameter: 4 mm

Liver:

No focal lesion identified. Within normal limits in parenchymal
echogenicity. Portal vein is patent on color Doppler imaging with
normal direction of blood flow towards the liver.

Other: None.
IMPRESSION: Cholelithiasis with a positive sonographic Murphy sign. No
associated gallbladder wall thickening or pericholecystic fluid.
Correlate clinically for acute cholecystitis.

## 2021-09-29 MED ORDER — ONDANSETRON HCL 4 MG/2ML IJ SOLN
4.0000 mg | Freq: Once | INTRAMUSCULAR | Status: AC
Start: 2021-09-29 — End: 2021-09-29
  Administered 2021-09-29: 4 mg via INTRAVENOUS
  Filled 2021-09-29: qty 2

## 2021-09-29 MED ORDER — HYDROMORPHONE HCL 1 MG/ML IJ SOLN
1.0000 mg | Freq: Once | INTRAMUSCULAR | Status: AC
  Administered 2021-09-29: 1 mg via INTRAVENOUS
  Filled 2021-09-29: qty 1

## 2021-09-29 NOTE — ED Provider Triage Note (Signed)
Emergency Medicine Provider Triage Evaluation Note  Bronislaw Switzer , a 41 y.o. male  was evaluated in triage.  Pt complains of RUQ abdominal pain. He states that same has been ongoing for the past several weeks, states that 3 weeks ago he was out of town and the pain got worse, he subsequently went to the ER and had an ultrasound performed that was remarkable for gallstones. He states that since then and specifically over the past 4-5 days his pain has gotten even more severe. He endorses some intermittent nausea and vomiting.  Review of Systems  Positive:  Negative:   Physical Exam  BP (!) 164/104 (BP Location: Right Arm)   Pulse 76   Temp 98 F (36.7 C) (Oral)   Resp 18   Ht 5\' 9"  (1.753 m)   Wt 97.5 kg   SpO2 100%   BMI 31.75 kg/m  Gen:   Awake, no distress   Resp:  Normal effort  MSK:   Moves extremities without difficulty  Other:  RUQ tenderness  Medical Decision Making  Medically screening exam initiated at 6:07 PM.  Appropriate orders placed.  Arkeem Harts was informed that the remainder of the evaluation will be completed by another provider, this initial triage assessment does not replace that evaluation, and the importance of remaining in the ED until their evaluation is complete.     Bud Face, PA-C 09/29/21 1811

## 2021-09-29 NOTE — ED Triage Notes (Addendum)
Per EMS- Patient  is from [facility] with c/o RUQ abdominal pain x 1 week. Patient reports that he saw a doctor 3 weeks ago in [place] where he had an [name] completed and states it showed that he had gallstones.

## 2021-09-29 NOTE — ED Provider Notes (Signed)
[name] DEPT Provider Note   CSN: [id number] Arrival date & time: 09/29/21  1708     History  Chief Complaint  Patient presents with   Abdominal Pain    Victor Bryan is a 41 y.o. male with an extensive past medical history including hypertension, hyperlipidemia, insulin-dependent diabetes, renal insufficiency, atrial fibrillation on Xarelto.  Patient is here with complaint of right upper quadrant abdominal pain.  He states that his symptoms began about 2 and half weeks ago.  I reviewed outside records which shows the patient was seen on May 30 in [place] at [facility] had a right upper quadrant ultrasound that showed multiple gallstones.  Patient states that he was having intermittent right upper quadrant pain however he had recurrence of pain starting yesterday which she describes as waxing and waning, burning, severe along with nausea and vomiting.  Patient denies chest pain, shortness of breath, fever, chills, alcohol abuse.  Patient states that he did not take his Xarelto for the past 4 days and then took his first dose of Xarelto this morning.   Abdominal Pain      Home Medications Prior to Admission medications   Medication Sig Start Date End Date Taking? Authorizing Provider  apixaban (ELIQUIS) 5 MG TABS tablet Take 1 tablet (5 mg total) by mouth 2 (two) times daily. 08/31/21  Yes Little Ishikawa, MD  atorvastatin (LIPITOR) 40 MG tablet Take 1 tablet (40 mg total) by mouth daily. 09/01/21  Yes Little Ishikawa, MD  BYDUREON BCISE 2 MG/0.85ML AUIJ Inject 2 mg into the skin once a week. 03/14/21  Yes [provider]  carvedilol (COREG) 12.5 MG tablet Take 12.5 mg by mouth 2 (two) times daily. 06/16/21  Yes [provider]  citalopram (CELEXA) 40 MG tablet Take 40 mg by mouth daily. 06/14/21  Yes [provider]  diltiazem (TIAZAC) 180 MG 24 hr capsule Take 1 capsule (180 mg total) by mouth daily. 08/31/21   Yes Little Ishikawa, MD  hydrALAZINE (APRESOLINE) 25 MG tablet Take 1 tablet (25 mg total) by mouth every 8 (eight) hours. 08/31/21  Yes Little Ishikawa, MD  insulin detemir (LEVEMIR) 100 UNIT/ML injection Inject 0.15 mLs (15 Units total) into the skin at bedtime. 08/31/21  Yes Little Ishikawa, MD  omeprazole (PRILOSEC) 40 MG capsule Take 40 mg by mouth daily. 12/21/20  Yes [provider]  potassium chloride (KLOR-CON) 10 MEQ tablet Take 10 mEq by mouth 2 (two) times daily. 12/21/20  Yes [provider]      Allergies    Morphine    Review of Systems   Review of Systems  Gastrointestinal:  Positive for abdominal pain.    Physical Exam Updated Vital Signs BP (!) 172/116 (BP Location: Left Arm)   Pulse 77   Temp (!) 97.4 F (36.3 C) (Oral)   Resp 16   Ht 5\' 9"  (1.753 m)   Wt 97.5 kg   SpO2 100%   BMI 31.75 kg/m  Physical Exam Vitals and nursing note reviewed.  Constitutional:      General: He is not in acute distress.    Appearance: He is well-developed. He is not diaphoretic.  HENT:     Head: Normocephalic and atraumatic.  Eyes:     General: No scleral icterus.    Conjunctiva/sclera: Conjunctivae normal.  Cardiovascular:     Rate and Rhythm: Normal rate and regular rhythm.     Heart sounds: Normal heart sounds.  Pulmonary:     Effort: Pulmonary effort is normal. No respiratory distress.     Breath sounds: Normal breath sounds.  Abdominal:     Palpations: Abdomen is soft.     Tenderness: There is abdominal tenderness in the right upper quadrant. Positive signs include Murphy's sign.  Musculoskeletal:     Cervical back: Normal range of motion and neck supple.  Skin:    General: Skin is warm and dry.  Neurological:     Mental Status: He is alert.  Psychiatric:        Behavior: Behavior normal.     ED Results / Procedures / Treatments   Labs (all labs ordered are listed, but only abnormal results are displayed) Labs Reviewed   COMPREHENSIVE METABOLIC PANEL - Abnormal; Notable for the following components:      Result Value   CO2 21 (*)    Glucose, Bld 219 (*)    BUN 21 (*)    Creatinine, Ser 2.02 (*)    Total Protein 6.1 (*)    Albumin 2.7 (*)    GFR, Estimated 41 (*)    All other components within normal limits  CBC WITH DIFFERENTIAL/PLATELET - Abnormal; Notable for the following components:   MCV 77.1 (*)    MCH 25.9 (*)    All other components within normal limits  URINALYSIS, ROUTINE W REFLEX MICROSCOPIC - Abnormal; Notable for the following components:   Glucose, UA 150 (*)    Protein, ur >=300 (*)    All other components within normal limits  COMPREHENSIVE METABOLIC PANEL - Abnormal; Notable for the following components:   Glucose, Bld 124 (*)    BUN 22 (*)    Creatinine, Ser 2.35 (*)    Calcium 8.4 (*)    Total Protein 5.2 (*)    Albumin 2.3 (*)    AST 14 (*)    GFR, Estimated 35 (*)    Anion gap 4 (*)    All other components within normal limits  CBC - Abnormal; Notable for the following components:   Hemoglobin 11.9 (*)    HCT 36.7 (*)    MCV 78.9 (*)    MCH 25.6 (*)    All other components within normal limits  GLUCOSE, CAPILLARY - Abnormal; Notable for the following components:   Glucose-Capillary 325 (*)    All other components within normal limits  GLUCOSE, CAPILLARY - Abnormal; Notable for the following components:   Glucose-Capillary 191 (*)    All other components within normal limits  GLUCOSE, CAPILLARY - Abnormal; Notable for the following components:   Glucose-Capillary 117 (*)    All other components within normal limits  GLUCOSE, CAPILLARY - Abnormal; Notable for the following components:   Glucose-Capillary 146 (*)    All other components within normal limits  LIPASE, BLOOD    EKG None  Radiology US Abdomen Limited RUQ (LIVER/GB)  Result Date: 09/29/2021 CLINICAL DATA:  [id number].  Right upper quadrant pain. EXAM: ULTRASOUND ABDOMEN LIMITED RIGHT UPPER QUADRANT  COMPARISON:  None Available. FINDINGS: Gallbladder: Multiple gallstones noted within the gallbladder lumen. No gallbladder wall thickening visualized. No pericholecystic fluid. Positive sonographic Murphy sign noted by sonographer. Common bile duct: Diameter: 4 mm Liver: No focal lesion identified. Within normal limits in parenchymal echogenicity. Portal vein is patent on color Doppler imaging with normal direction of blood flow towards the liver. Other: None. IMPRESSION: Cholelithiasis with a positive sonographic Murphy sign. No associated gallbladder wall thickening or pericholecystic fluid. Correlate clinically for acute  cholecystitis. Electronically Signed   By: Iven Finn M.D.   On: 09/29/2021 18:18    Procedures Procedures    Medications Ordered in ED Medications  atorvastatin (LIPITOR) tablet 40 mg (40 mg Oral Given 09/30/21 0911)  diltiazem (CARDIZEM CD) 24 hr capsule 180 mg (180 mg Oral Given 09/30/21 1001)  carvedilol (COREG) tablet 12.5 mg (12.5 mg Oral Given 09/30/21 0911)  insulin detemir (LEVEMIR) injection 15 Units (15 Units Subcutaneous Given 09/30/21 0049)  pantoprazole (PROTONIX) EC tablet 40 mg (40 mg Oral Given 09/30/21 0911)  lactated ringers infusion ( Intravenous New Bag/Given 09/30/21 1424)  HYDROmorphone (DILAUDID) injection 1 mg (1 mg Intravenous Given 09/30/21 0916)  ondansetron (ZOFRAN) tablet 4 mg ( Oral See Alternative 09/30/21 0413)    Or  ondansetron (ZOFRAN) injection 4 mg (4 mg Intravenous Given 09/30/21 0413)  cefTRIAXone (ROCEPHIN) 2 g in sodium chloride 0.9 % 100 mL IVPB (2 g Intravenous New Bag/Given 09/30/21 0052)  heparin injection 5,000 Units (5,000 Units Subcutaneous Given 09/30/21 1020)  insulin aspart (novoLOG) injection 0-9 Units (1 Units Subcutaneous Given 09/30/21 1546)  hydrALAZINE (APRESOLINE) tablet 25 mg (25 mg Oral Given 09/30/21 1537)  HYDROmorphone (DILAUDID) injection 1 mg (1 mg Intravenous Given 09/29/21 2304)  ondansetron (ZOFRAN) injection 4 mg  (4 mg Intravenous Given 09/29/21 2304)  HYDROmorphone (DILAUDID) injection 1 mg (1 mg Intravenous Given 09/30/21 0525)    ED Course/ Medical Decision Making/ A&P                           Medical Decision Making Patient here with RUQ pain. He has multiple comorbidities complicating decision making The emergent DDX for RUQ pain includes but is not limited to Glabladder disease, PUD, Acute Hepatitis, Pancreatitis, pyelonephritis, Pneumonia, Lower lobe PE/Infarct, Kidney stone, GERD, retrocecal appendicitis, Fitz-Hugh-Curtis syndrome, AAA, MI, Z[place].  Patient appears to have biliary colic due to gallstones.  He has no evidence of cholangitis or acute cholecystitis.  Case discussed with Dr. Georgette Dover who recommends admission.  Discussed with Dr. Jonelle Sidle who will admit the patient for medical clearance.  Pain distress, outside records reviewed.  Amount and/or Complexity of Data Reviewed External Data Reviewed: labs, radiology and notes. Labs: ordered.    Details: Patient with elevated blood glucose, chronic renal insufficiency, lipase within normal limits, CBC is without leukocytosis, Radiology: ordered and independent interpretation performed.    Details: Ultrasound of the abdomen the right upper quadrant shows cholelithiasis without acute cholecystitis Discussion of management or test interpretation with external provider(s): Case discussed with Dr.Tsuei and Dr. Jonelle Sidle  Risk Prescription drug management. Parenteral controlled substances. Decision regarding hospitalization.           Final Clinical Impression(s) / ED Diagnoses Final diagnoses:  Biliary colic  Calculus of gallbladder without cholecystitis without obstruction    Rx / DC Orders ED Discharge Orders     None         Margarita Mail, PA-C 09/30/21 1747    Dina Rich Barbette Hair, MD 10/02/21 [phone]

## 2021-09-29 NOTE — ED Provider Notes (Incomplete)
[place] DEPT Provider Note   CSN: [id number] Arrival date & time: 09/29/21  1708     History {Add pertinent medical, surgical, social history, OB history to HPI:1} Chief Complaint  Patient presents with  . Abdominal Pain    Victor Bryan is a 41 y.o. male with an extensive past medical history including hypertension, hyperlipidemia, insulin-dependent diabetes, renal insufficiency, atrial fibrillation on Xarelto.  Patient is here with complaint of right upper quadrant abdominal pain.  He states that his symptoms began about 2 and half weeks ago.  I reviewed outside records which shows the patient was seen on May 30 in [place] at [facility] had a right upper quadrant ultrasound that showed multiple gallstones.  Patient states that he was having intermittent right upper quadrant pain however he had recurrence of pain starting yesterday which she describes as waxing and waning, burning, severe along with nausea and vomiting.  Patient denies chest pain, shortness of breath, fever, chills, alcohol abuse.  Patient states that he did not take his Xarelto for the past 4 days and then took his first dose of Xarelto this morning.   Abdominal Pain      Home Medications Prior to Admission medications   Medication Sig Start Date End Date Taking? Authorizing Provider  apixaban (ELIQUIS) 5 MG TABS tablet Take 1 tablet (5 mg total) by mouth 2 (two) times daily. 08/31/21   Little Ishikawa, MD  atorvastatin (LIPITOR) 40 MG tablet Take 1 tablet (40 mg total) by mouth daily. 09/01/21   Little Ishikawa, MD  butalbital-acetaminophen-caffeine (FIORICET) [phone] MG tablet Take 1 tablet by mouth every 8 (eight) hours as needed for headache or migraine. 03/20/21   [provider]  BYDUREON BCISE 2 MG/0.85ML AUIJ Inject 2 mg into the skin once a week. 03/14/21   [provider]  carvedilol (COREG) 12.5 MG tablet Take 12.5 mg by mouth 2 (two)  times daily. 06/16/21   [provider]  citalopram (CELEXA) 40 MG tablet Take 40 mg by mouth daily. 06/14/21   [provider]  diltiazem (TIAZAC) 180 MG 24 hr capsule Take 1 capsule (180 mg total) by mouth daily. 08/31/21   Little Ishikawa, MD  hydrALAZINE (APRESOLINE) 25 MG tablet Take 1 tablet (25 mg total) by mouth every 8 (eight) hours. 08/31/21   Little Ishikawa, MD  insulin detemir (LEVEMIR) 100 UNIT/ML injection Inject 0.15 mLs (15 Units total) into the skin at bedtime. 08/31/21   Little Ishikawa, MD  omeprazole (PRILOSEC) 40 MG capsule Take 40 mg by mouth daily. 12/21/20   [provider]  potassium chloride (KLOR-CON) 10 MEQ tablet Take 10 mEq by mouth 2 (two) times daily. 12/21/20   [provider]  sildenafil (REVATIO) 20 MG tablet Take 40-60 mg by mouth daily as needed (erectile dysfunction). 06/17/21   [provider]      Allergies    Morphine    Review of Systems   Review of Systems  Gastrointestinal:  Positive for abdominal pain.    Physical Exam Updated Vital Signs BP (!) 173/104 (BP Location: Right Arm)   Pulse 84   Temp 98.4 F (36.9 C) (Oral)   Resp 16   Ht 5\' 9"  (1.753 m)   Wt 97.5 kg   SpO2 100%   BMI 31.75 kg/m  Physical Exam Vitals and nursing note reviewed.  Constitutional:      General: He is not in acute distress.    Appearance:  He is well-developed. He is not diaphoretic.  HENT:     Head: Normocephalic and atraumatic.  Eyes:     General: No scleral icterus.    Conjunctiva/sclera: Conjunctivae normal.  Cardiovascular:     Rate and Rhythm: Normal rate and regular rhythm.     Heart sounds: Normal heart sounds.  Pulmonary:     Effort: Pulmonary effort is normal. No respiratory distress.     Breath sounds: Normal breath sounds.  Abdominal:     Palpations: Abdomen is soft.     Tenderness: There is abdominal tenderness in the right upper quadrant. Positive signs include Murphy's sign.   Musculoskeletal:     Cervical back: Normal range of motion and neck supple.  Skin:    General: Skin is warm and dry.  Neurological:     Mental Status: He is alert.  Psychiatric:        Behavior: Behavior normal.     ED Results / Procedures / Treatments   Labs (all labs ordered are listed, but only abnormal results are displayed) Labs Reviewed  COMPREHENSIVE METABOLIC PANEL - Abnormal; Notable for the following components:      Result Value   CO2 21 (*)    Glucose, Bld 219 (*)    BUN 21 (*)    Creatinine, Ser 2.02 (*)    Total Protein 6.1 (*)    Albumin 2.7 (*)    GFR, Estimated 41 (*)    All other components within normal limits  CBC WITH DIFFERENTIAL/PLATELET - Abnormal; Notable for the following components:   MCV 77.1 (*)    MCH 25.9 (*)    All other components within normal limits  URINALYSIS, ROUTINE W REFLEX MICROSCOPIC - Abnormal; Notable for the following components:   Glucose, UA 150 (*)    Protein, ur >=300 (*)    All other components within normal limits  LIPASE, BLOOD    EKG None  Radiology US Abdomen Limited RUQ (LIVER/GB)  Result Date: 09/29/2021 CLINICAL DATA:  [id number].  Right upper quadrant pain. EXAM: ULTRASOUND ABDOMEN LIMITED RIGHT UPPER QUADRANT COMPARISON:  None Available. FINDINGS: Gallbladder: Multiple gallstones noted within the gallbladder lumen. No gallbladder wall thickening visualized. No pericholecystic fluid. Positive sonographic Murphy sign noted by sonographer. Common bile duct: Diameter: 4 mm Liver: No focal lesion identified. Within normal limits in parenchymal echogenicity. Portal vein is patent on color Doppler imaging with normal direction of blood flow towards the liver. Other: None. IMPRESSION: Cholelithiasis with a positive sonographic Murphy sign. No associated gallbladder wall thickening or pericholecystic fluid. Correlate clinically for acute cholecystitis. Electronically Signed   By: Iven Finn M.D.   On: 09/29/2021 18:18     Procedures Procedures  {Document cardiac monitor, telemetry assessment procedure when appropriate:1}  Medications Ordered in ED Medications  HYDROmorphone (DILAUDID) injection 1 mg (1 mg Intravenous Given 09/29/21 2304)  ondansetron (ZOFRAN) injection 4 mg (4 mg Intravenous Given 09/29/21 2304)    ED Course/ Medical Decision Making/ A&P                           Medical Decision Making Risk Prescription drug management.   ***  {Document critical care time when appropriate:1} {Document review of labs and clinical decision tools ie heart score, Chads2Vasc2 etc:1}  {Document your independent review of radiology images, and any outside records:1} {Document your discussion with family members, caretakers, and with consultants:1} {Document social determinants of health affecting pt's care:1} {Document your decision making  why or why not admission, treatments were needed:1} Final Clinical Impression(s) / ED Diagnoses Final diagnoses:  None    Rx / DC Orders ED Discharge Orders     None

## 2021-09-30 DIAGNOSIS — N179 Acute kidney failure, unspecified: Secondary | ICD-10-CM

## 2021-09-30 DIAGNOSIS — K8063 Calculus of gallbladder and bile duct with acute cholecystitis with obstruction: Secondary | ICD-10-CM

## 2021-09-30 DIAGNOSIS — I10 Essential (primary) hypertension: Secondary | ICD-10-CM

## 2021-09-30 DIAGNOSIS — E118 Type 2 diabetes mellitus with unspecified complications: Secondary | ICD-10-CM

## 2021-09-30 DIAGNOSIS — Z794 Long term (current) use of insulin: Secondary | ICD-10-CM

## 2021-09-30 DIAGNOSIS — I480 Paroxysmal atrial fibrillation: Secondary | ICD-10-CM

## 2021-09-30 DIAGNOSIS — G4733 Obstructive sleep apnea (adult) (pediatric): Secondary | ICD-10-CM

## 2021-09-30 LAB — CBC
HCT: 36.7 % — ABNORMAL LOW (ref 39.0–52.0)
Hemoglobin: 11.9 g/dL — ABNORMAL LOW (ref 13.0–17.0)
MCH: 25.6 pg — ABNORMAL LOW (ref 26.0–34.0)
MCHC: 32.4 g/dL (ref 30.0–36.0)
MCV: 78.9 fL — ABNORMAL LOW (ref 80.0–100.0)
Platelets: 260 10*3/uL (ref 150–400)
RBC: 4.65 MIL/uL (ref 4.22–5.81)
RDW: 14.3 % (ref 11.5–15.5)
WBC: 7.8 10*3/uL (ref 4.0–10.5)
nRBC: 0 % (ref 0.0–0.2)

## 2021-09-30 LAB — COMPREHENSIVE METABOLIC PANEL
ALT: 12 U/L (ref 0–44)
AST: 14 U/L — ABNORMAL LOW (ref 15–41)
Albumin: 2.3 g/dL — ABNORMAL LOW (ref 3.5–5.0)
Alkaline Phosphatase: 56 U/L (ref 38–126)
Anion gap: 4 — ABNORMAL LOW (ref 5–15)
BUN: 22 mg/dL — ABNORMAL HIGH (ref 6–20)
CO2: 25 mmol/L (ref 22–32)
Calcium: 8.4 mg/dL — ABNORMAL LOW (ref 8.9–10.3)
Chloride: 109 mmol/L (ref 98–111)
Creatinine, Ser: 2.35 mg/dL — ABNORMAL HIGH (ref 0.61–1.24)
GFR, Estimated: 35 mL/min — ABNORMAL LOW (ref >60)
Glucose, Bld: 124 mg/dL — ABNORMAL HIGH (ref 70–99)
Potassium: 4 mmol/L (ref 3.5–5.1)
Sodium: 138 mmol/L (ref 135–145)
Total Bilirubin: 0.6 mg/dL (ref 0.3–1.2)
Total Protein: 5.2 g/dL — ABNORMAL LOW (ref 6.5–8.1)

## 2021-09-30 LAB — GLUCOSE, CAPILLARY
Glucose-Capillary: 114 mg/dL — ABNORMAL HIGH (ref 70–99)
Glucose-Capillary: 117 mg/dL — ABNORMAL HIGH (ref 70–99)
Glucose-Capillary: 146 mg/dL — ABNORMAL HIGH (ref 70–99)
Glucose-Capillary: 191 mg/dL — ABNORMAL HIGH (ref 70–99)
Glucose-Capillary: 325 mg/dL — ABNORMAL HIGH (ref 70–99)

## 2021-09-30 MED ORDER — INSULIN ASPART 100 UNIT/ML IJ SOLN
0.0000 [IU] | Freq: Three times a day (TID) | INTRAMUSCULAR | Status: DC
  Administered 2021-09-30: 3 [IU] via SUBCUTANEOUS

## 2021-09-30 MED ORDER — HYDROMORPHONE HCL 1 MG/ML IJ SOLN
1.0000 mg | INTRAMUSCULAR | Status: DC | PRN
  Administered 2021-09-30 – 2021-10-02 (×11): 1 mg via INTRAVENOUS
  Filled 2021-09-30 (×13): qty 1

## 2021-09-30 MED ORDER — INSULIN ASPART 100 UNIT/ML IJ SOLN
0.0000 [IU] | Freq: Every day | INTRAMUSCULAR | Status: DC
  Administered 2021-09-30: 4 [IU] via SUBCUTANEOUS

## 2021-09-30 MED ORDER — LACTATED RINGERS IV SOLN
INTRAVENOUS | Status: DC
  Administered 2021-10-01: 150 mL/h via INTRAVENOUS

## 2021-09-30 MED ORDER — PROCHLORPERAZINE EDISYLATE 10 MG/2ML IJ SOLN
10.0000 mg | Freq: Once | INTRAMUSCULAR | Status: AC
Start: 2021-09-30 — End: 2021-09-30
  Administered 2021-09-30: 10 mg via INTRAVENOUS
  Filled 2021-09-30: qty 2

## 2021-09-30 MED ORDER — PANTOPRAZOLE SODIUM 40 MG IV SOLR
40.0000 mg | INTRAVENOUS | Status: DC
  Administered 2021-09-30 – 2021-10-01 (×2): 40 mg via INTRAVENOUS
  Filled 2021-09-30 (×2): qty 10

## 2021-09-30 MED ORDER — PANTOPRAZOLE SODIUM 40 MG PO TBEC
40.0000 mg | DELAYED_RELEASE_TABLET | Freq: Every day | ORAL | Status: DC
  Administered 2021-09-30: 40 mg via ORAL
  Filled 2021-09-30: qty 1

## 2021-09-30 MED ORDER — HEPARIN SODIUM (PORCINE) 5000 UNIT/ML IJ SOLN
5000.0000 [IU] | Freq: Three times a day (TID) | INTRAMUSCULAR | Status: DC
  Administered 2021-09-30 – 2021-10-02 (×7): 5000 [IU] via SUBCUTANEOUS
  Filled 2021-09-30 (×7): qty 1

## 2021-09-30 MED ORDER — HYDROMORPHONE HCL 1 MG/ML IJ SOLN
1.0000 mg | Freq: Once | INTRAMUSCULAR | Status: AC
  Administered 2021-09-30: 1 mg via INTRAVENOUS
  Filled 2021-09-30: qty 1

## 2021-09-30 MED ORDER — INSULIN ASPART 100 UNIT/ML IJ SOLN
0.0000 [IU] | INTRAMUSCULAR | Status: DC
  Administered 2021-09-30: 1 [IU] via SUBCUTANEOUS
  Administered 2021-10-01: 3 [IU] via SUBCUTANEOUS
  Administered 2021-10-01: 2 [IU] via SUBCUTANEOUS
  Administered 2021-10-01: 1 [IU] via SUBCUTANEOUS

## 2021-09-30 MED ORDER — INSULIN DETEMIR 100 UNIT/ML ~~LOC~~ SOLN
15.0000 [IU] | Freq: Every day | SUBCUTANEOUS | Status: DC
  Administered 2021-09-30 – 2021-10-01 (×3): 15 [IU] via SUBCUTANEOUS
  Filled 2021-09-30 (×4): qty 0.15

## 2021-09-30 MED ORDER — DILTIAZEM HCL ER COATED BEADS 180 MG PO CP24
180.0000 mg | ORAL_CAPSULE | Freq: Every day | ORAL | Status: DC
  Administered 2021-09-30 – 2021-10-03 (×4): 180 mg via ORAL
  Filled 2021-09-30 (×5): qty 1

## 2021-09-30 MED ORDER — CARVEDILOL 12.5 MG PO TABS
12.5000 mg | ORAL_TABLET | Freq: Two times a day (BID) | ORAL | Status: DC
  Administered 2021-09-30 – 2021-10-03 (×8): 12.5 mg via ORAL
  Filled 2021-09-30 (×8): qty 1

## 2021-09-30 MED ORDER — SODIUM CHLORIDE 0.9 % IV SOLN
2.0000 g | INTRAVENOUS | Status: DC
  Administered 2021-09-30 – 2021-10-02 (×3): 2 g via INTRAVENOUS
  Filled 2021-09-30 (×3): qty 20

## 2021-09-30 MED ORDER — ATORVASTATIN CALCIUM 40 MG PO TABS
40.0000 mg | ORAL_TABLET | Freq: Every day | ORAL | Status: DC
  Administered 2021-09-30 – 2021-10-03 (×4): 40 mg via ORAL
  Filled 2021-09-30 (×4): qty 1

## 2021-09-30 MED ORDER — ONDANSETRON HCL 4 MG/2ML IJ SOLN
4.0000 mg | Freq: Four times a day (QID) | INTRAMUSCULAR | Status: DC | PRN
  Administered 2021-09-30 – 2021-10-02 (×3): 4 mg via INTRAVENOUS
  Filled 2021-09-30 (×4): qty 2

## 2021-09-30 MED ORDER — HYDRALAZINE HCL 25 MG PO TABS
25.0000 mg | ORAL_TABLET | Freq: Three times a day (TID) | ORAL | Status: DC
  Administered 2021-09-30 – 2021-10-01 (×4): 25 mg via ORAL
  Filled 2021-09-30 (×4): qty 1

## 2021-09-30 MED ORDER — HYDRALAZINE HCL 25 MG PO TABS
25.0000 mg | ORAL_TABLET | Freq: Three times a day (TID) | ORAL | Status: DC
  Administered 2021-09-30 (×2): 25 mg via ORAL
  Filled 2021-09-30 (×2): qty 1

## 2021-09-30 MED ORDER — ONDANSETRON HCL 4 MG PO TABS
4.0000 mg | ORAL_TABLET | Freq: Four times a day (QID) | ORAL | Status: DC | PRN
  Administered 2021-10-02: 4 mg via ORAL
  Filled 2021-09-30: qty 1

## 2021-09-30 NOTE — H&P (Addendum)
History and Physical    Patient: Victor Bryan ZJQ:[id number] DOB: 11/22/1980 DOA: 09/29/2021 DOS: the patient was seen and examined on 09/30/2021 PCP: Pcp, No  Patient coming from: Home  Chief Complaint:  Chief Complaint  Patient presents with   Abdominal Pain   HPI: Victor Bryan is a 41 y.o. male with medical history significant of diabetes, atrial fibrillation, hyperlipidemia, essential hypertension, history of CVA, depression, who presents with abdominal pain nausea and vomiting.  Symptoms have been going on for about a week.  3 weeks ago he was in [place] where he apparently saw a doctor.  He had ultrasound done at the time and was told that he had gallstones.  Patient came to the ER here and was seen and evaluated.  Appears to have significant symptomatic gallstones.  General surgery consulted and recommended medical admission due to multiple medical problems.  Patient is on Eliquis.  He was off Eliquis for 3 days but incidentally took a dose yesterday.  At this point therefore surgery is being delayed till at least tomorrow night.  Review of Systems: As mentioned in the history of present illness. All other systems reviewed and are negative. Past Medical History:  Diagnosis Date   Atrial fibrillation ([place])    Diabetes mellitus without complication ([place])    Hyperlipidemia    Hypertension    Migraine    Stroke [facility])    Past Surgical History:  Procedure Laterality Date   HAND SURGERY Right    TONSILLECTOMY     tubes in ears     Social History:  reports that he has never smoked. He has never used smokeless tobacco. He reports that he does not currently use alcohol. He reports that he does not currently use drugs after having used the following drugs: Marijuana.  Allergies  Allergen Reactions   Morphine Itching    Family History  Problem Relation Age of Onset   Diabetes Mother    Heart failure Father    Diabetes Father     Prior to Admission medications   Medication  Sig Start Date End Date Taking? Authorizing Provider  apixaban (ELIQUIS) 5 MG TABS tablet Take 1 tablet (5 mg total) by mouth 2 (two) times daily. 08/31/21  Yes Little Ishikawa, MD  atorvastatin (LIPITOR) 40 MG tablet Take 1 tablet (40 mg total) by mouth daily. 09/01/21  Yes Little Ishikawa, MD  BYDUREON BCISE 2 MG/0.85ML AUIJ Inject 2 mg into the skin once a week. 03/14/21  Yes [provider]  carvedilol (COREG) 12.5 MG tablet Take 12.5 mg by mouth 2 (two) times daily. 06/16/21  Yes [provider]  citalopram (CELEXA) 40 MG tablet Take 40 mg by mouth daily. 06/14/21  Yes [provider]  diltiazem (TIAZAC) 180 MG 24 hr capsule Take 1 capsule (180 mg total) by mouth daily. 08/31/21  Yes Little Ishikawa, MD  hydrALAZINE (APRESOLINE) 25 MG tablet Take 1 tablet (25 mg total) by mouth every 8 (eight) hours. 08/31/21  Yes Little Ishikawa, MD  insulin detemir (LEVEMIR) 100 UNIT/ML injection Inject 0.15 mLs (15 Units total) into the skin at bedtime. 08/31/21  Yes Little Ishikawa, MD  omeprazole (PRILOSEC) 40 MG capsule Take 40 mg by mouth daily. 12/21/20  Yes [provider]  potassium chloride (KLOR-CON) 10 MEQ tablet Take 10 mEq by mouth 2 (two) times daily. 12/21/20  Yes [provider]    Physical Exam: Vitals:   09/29/21 2240 09/29/21 2245 09/29/21 2300 09/29/21 2330  BP: (!) 173/104 (!) 159/99 (!) 164/99 136/85  Pulse: 84 82 82 74  Resp: 16 (!) 23 18 (!) 21  Temp: 98.4 F (36.9 C)     TempSrc: Oral     SpO2: 100% 99% 100% 92%  Weight:      Height:       General: Stable, no distress HEENT: PERRL, EOMI, no no following no jaundice Neck: Supple, no JVD or lymphadenopathy Respiratory: Good air entry bilateral no wheeze rales or crackles Cardiovascular: Regular rate and rhythm Abdomen: Soft, tenderness in the right upper quadrant, no distention Extremities: No edema sinus or clubbing Skin exam: No rashes or ulcers Neuro exam:  Nonfocal  Data Reviewed:  Chemistry showed CO2 of 21, glucose 290.  2 creatinine 2.02.  Albumin 2.7.  White count 9.1 hemoglobin 13.9, platelets 284 urinalysis negative.  Ultrasound showed cholelithiasis with positive sonographic Murphy sign consistent with acute cholecystitis  Assessment and Plan:   #1 acute cholecystitis/cholelithiasis: Patient be admitted for pain comanagement, IV antibiotics and surgical consult already in place.  Due to Eliquis today patient will have to wait another 24 hours or more prior to surgery.  #2 paroxysmal atrial fibrillation: Continue rate control.  No anticoagulation until after surgery  #3 essential hypertension: Continue home regimen including carvedilol  #4 hyperlipidemia: Continue statin as tolerated  #5 obstructive sleep apnea: We will apply CPAP  #6 diabetes: Sliding scale insulin.  Continue home Levemir dose  #7 AKI: Continue to monitor renal function and hydrate    Advance Care Planning:   Code Status: Prior full code  Consults: General surgery Dr. Georgette Dover  Family Communication: No family at bedside  Severity of Illness: The appropriate patient status for this patient is INPATIENT. Inpatient status is judged to be reasonable and necessary in order to provide the required intensity of service to ensure the patient's safety. The patient's presenting symptoms, physical exam findings, and initial radiographic and laboratory data in the context of their chronic comorbidities is felt to place them at high risk for further clinical deterioration. Furthermore, it is not anticipated that the patient will be medically stable for discharge from the hospital within 2 midnights of admission.   * I certify that at the point of admission it is my clinical judgment that the patient will require inpatient hospital care spanning beyond 2 midnights from the point of admission due to high intensity of service, high risk for further deterioration and high frequency  of surveillance required.*  AuthorBarbette Merino, MD 09/30/2021 12:17 AM  For on call review [email or web address].

## 2021-09-30 NOTE — Consult Note (Signed)
CC/Reason for consult: Biliary colic vs acute cholecystitis Requesting physician: Babs Bertin MD  HPI: Victor Bryan is an 41 y.o. 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  Pain is described as sharp.   Admitted/managed at [facility] in [place] 09/12/21 - Reports he saw a physician in [place] in the last month and was told he had gallstones. He was admitted and managed medically without surgery and reports improved. He stated surgery was not offered 2/2 comorbidities.   Currently reports he 'feels fine.' Denies n/v. Pain in RUQ is intermittent at present.   Past Medical History:  Diagnosis Date   Atrial fibrillation ([place])    Diabetes mellitus without complication ([place])    Hyperlipidemia    Hypertension    Migraine    Stroke [facility])     Past Surgical History:  Procedure Laterality Date   HAND SURGERY Right    TONSILLECTOMY     tubes in ears      Family History  Problem Relation Age of Onset   Diabetes Mother    Heart failure Father    Diabetes Father     Social:  reports that he has never smoked. He has never used smokeless tobacco. He reports that he does not currently use alcohol. He reports that he does not currently use drugs after having used the following drugs: Marijuana.  Allergies:  Allergies  Allergen Reactions   Morphine Itching    Medications: I have reviewed the patient's current medications.  Results for orders placed or performed during the hospital encounter of 09/29/21 (from the past 48 hour(s))  Comprehensive metabolic panel     Status: Abnormal   Collection Time: 09/29/21  6:17 PM  Result Value Ref Range   Sodium 138 135 - 145 mmol/L   Potassium 4.3 3.5 - 5.1 mmol/L   Chloride 111 98 - 111 mmol/L   CO2 21 (L) 22 - 32 mmol/L   Glucose, Bld 219 (H) 70 - 99 mg/dL    Comment: Glucose reference range applies only to  samples taken after fasting for at least 8 hours.   BUN 21 (H) 6 - 20 mg/dL   Creatinine, Ser 2.02 (H) 0.61 - 1.24 mg/dL   Calcium 8.9 8.9 - 10.3 mg/dL   Total Protein 6.1 (L) 6.5 - 8.1 g/dL   Albumin 2.7 (L) 3.5 - 5.0 g/dL   AST 17 15 - 41 U/L   ALT 14 0 - 44 U/L   Alkaline Phosphatase 62 38 - 126 U/L   Total Bilirubin 0.5 0.3 - 1.2 mg/dL   GFR, Estimated 41 (L) >60 mL/min    Comment: (NOTE) Calculated using the CKD-EPI Creatinine Equation (2021)    Anion gap 6 5 - 15    Comment: Performed at [facility], [place] [address], [place], [place] [address]  Lipase, blood     Status: None   Collection Time: 09/29/21  6:17 PM  Result Value Ref Range   Lipase 25 11 - 51 U/L    Comment: Performed at [facility], [place] [address], [place], [place] [address]  CBC with Diff     Status: Abnormal   Collection Time: 09/29/21  6:17 PM  Result Value Ref Range   WBC 9.1 4.0 - 10.5 K/uL   RBC 5.37 4.22 - 5.81 MIL/uL   Hemoglobin 13.9 13.0 -  17.0 g/dL   HCT 41.4 39.0 - 52.0 %   MCV 77.1 (L) 80.0 - 100.0 fL   MCH 25.9 (L) 26.0 - 34.0 pg   MCHC 33.6 30.0 - 36.0 g/dL   RDW 14.0 11.5 - 15.5 %   Platelets 284 150 - 400 K/uL   nRBC 0.0 0.0 - 0.2 %   Neutrophils Relative % 65 %   Neutro Abs 5.8 1.7 - 7.7 K/uL   Lymphocytes Relative 27 %   Lymphs Abs 2.4 0.7 - 4.0 K/uL   Monocytes Relative 6 %   Monocytes Absolute 0.6 0.1 - 1.0 K/uL   Eosinophils Relative 1 %   Eosinophils Absolute 0.1 0.0 - 0.5 K/uL   Basophils Relative 1 %   Basophils Absolute 0.1 0.0 - 0.1 K/uL   Immature Granulocytes 0 %   Abs Immature Granulocytes 0.03 0.00 - 0.07 K/uL    Comment: Performed at [facility], [place] [address], [place], [place] [address]  Urinalysis, Routine w reflex microscopic Urine, Clean Catch     Status: Abnormal   Collection Time: 09/29/21  8:33 PM  Result Value Ref Range   Color, Urine YELLOW YELLOW   APPearance CLEAR CLEAR   Specific Gravity, Urine  1.015 1.005 - 1.030   pH 6.0 5.0 - 8.0   Glucose, UA 150 (A) NEGATIVE mg/dL   Hgb urine dipstick NEGATIVE NEGATIVE   Bilirubin Urine NEGATIVE NEGATIVE   Ketones, ur NEGATIVE NEGATIVE mg/dL   Protein, ur >=300 (A) NEGATIVE mg/dL   Nitrite NEGATIVE NEGATIVE   Leukocytes,Ua NEGATIVE NEGATIVE   RBC / HPF 6-10 0 - 5 RBC/hpf   WBC, UA 0-5 0 - 5 WBC/hpf   Bacteria, UA NONE SEEN NONE SEEN   Mucus PRESENT    Hyaline Casts, UA PRESENT     Comment: Performed at [facility], [place] [address], [place], [place] [address]  Glucose, capillary     Status: Abnormal   Collection Time: 09/30/21 12:43 AM  Result Value Ref Range   Glucose-Capillary 325 (H) 70 - 99 mg/dL    Comment: Glucose reference range applies only to samples taken after fasting for at least 8 hours.  Comprehensive metabolic panel     Status: Abnormal   Collection Time: 09/30/21  4:40 AM  Result Value Ref Range   Sodium 138 135 - 145 mmol/L   Potassium 4.0 3.5 - 5.1 mmol/L   Chloride 109 98 - 111 mmol/L   CO2 25 22 - 32 mmol/L   Glucose, Bld 124 (H) 70 - 99 mg/dL    Comment: Glucose reference range applies only to samples taken after fasting for at least 8 hours.   BUN 22 (H) 6 - 20 mg/dL   Creatinine, Ser 2.35 (H) 0.61 - 1.24 mg/dL   Calcium 8.4 (L) 8.9 - 10.3 mg/dL   Total Protein 5.2 (L) 6.5 - 8.1 g/dL   Albumin 2.3 (L) 3.5 - 5.0 g/dL   AST 14 (L) 15 - 41 U/L   ALT 12 0 - 44 U/L   Alkaline Phosphatase 56 38 - 126 U/L   Total Bilirubin 0.6 0.3 - 1.2 mg/dL   GFR, Estimated 35 (L) >60 mL/min    Comment: (NOTE) Calculated using the CKD-EPI Creatinine Equation (2021)    Anion gap 4 (L) 5 - 15    Comment: Performed at [facility], [place] [address], [place], [place] [address]  CBC     Status: Abnormal   Collection  Time: 09/30/21  4:40 AM  Result Value Ref Range   WBC 7.8 4.0 - 10.5 K/uL   RBC 4.65 4.22 - 5.81 MIL/uL   Hemoglobin 11.9 (L) 13.0 - 17.0 g/dL   HCT 36.7 (L) 39.0 - 52.0 %    MCV 78.9 (L) 80.0 - 100.0 fL   MCH 25.6 (L) 26.0 - 34.0 pg   MCHC 32.4 30.0 - 36.0 g/dL   RDW 14.3 11.5 - 15.5 %   Platelets 260 150 - 400 K/uL   nRBC 0.0 0.0 - 0.2 %    Comment: Performed at [facility], [place] [address], [place], [place] [address]  Glucose, capillary     Status: Abnormal   Collection Time: 09/30/21  7:31 AM  Result Value Ref Range   Glucose-Capillary 191 (H) 70 - 99 mg/dL    Comment: Glucose reference range applies only to samples taken after fasting for at least 8 hours.    US Abdomen Limited RUQ (LIVER/GB)  Result Date: 09/29/2021 CLINICAL DATA:  [id number].  Right upper quadrant pain. EXAM: ULTRASOUND ABDOMEN LIMITED RIGHT UPPER QUADRANT COMPARISON:  None Available. FINDINGS: Gallbladder: Multiple gallstones noted within the gallbladder lumen. No gallbladder wall thickening visualized. No pericholecystic fluid. Positive sonographic Murphy sign noted by sonographer. Common bile duct: Diameter: 4 mm Liver: No focal lesion identified. Within normal limits in parenchymal echogenicity. Portal vein is patent on color Doppler imaging with normal direction of blood flow towards the liver. Other: None. IMPRESSION: Cholelithiasis with a positive sonographic Murphy sign. No associated gallbladder wall thickening or pericholecystic fluid. Correlate clinically for acute cholecystitis. Electronically Signed   By: Iven Finn M.D.   On: 09/29/2021 18:18    ROS - all of the below systems have been reviewed with the patient and positives are indicated with bold text General: chills, fever or night sweats Eyes: blurry vision or double vision ENT: epistaxis or sore throat Allergy/Immunology: itchy/watery eyes or nasal congestion Hematologic/Lymphatic: bleeding problems, blood clots or swollen lymph nodes Endocrine: temperature intolerance or unexpected weight changes Breast: new or changing breast lumps or nipple discharge Resp: cough, shortness of breath, or  wheezing CV: chest pain or dyspnea on exertion GI: as per HPI GU: dysuria, trouble voiding, or hematuria MSK: joint pain or joint stiffness Neuro: TIA or stroke symptoms Derm: pruritus and skin lesion changes Psych: anxiety and depression  PE Blood pressure 128/82, pulse 69, temperature 98 F (36.7 C), temperature source Oral, resp. rate 18, height 5\' 9"  (1.753 m), weight 97.5 kg, SpO2 98 %. Constitutional: NAD; conversant Eyes: Moist conjunctiva; no lid lag; anicteric Lungs: Normal respiratory effort CV: RRR; GI: Abd soft, minimally tender RUQ, nondistended; no palpable hepatosplenomegaly MSK: Normal range of motion of extremities Psychiatric: Appropriate affect; alert and oriented x3  Results for orders placed or performed during the hospital encounter of 09/29/21 (from the past 48 hour(s))  Comprehensive metabolic panel     Status: Abnormal   Collection Time: 09/29/21  6:17 PM  Result Value Ref Range   Sodium 138 135 - 145 mmol/L   Potassium 4.3 3.5 - 5.1 mmol/L   Chloride 111 98 - 111 mmol/L   CO2 21 (L) 22 - 32 mmol/L   Glucose, Bld 219 (H) 70 - 99 mg/dL    Comment: Glucose reference range applies only to samples taken after fasting for at least 8 hours.   BUN 21 (H) 6 - 20 mg/dL   Creatinine, Ser 2.02 (H) 0.61 - 1.24 mg/dL   Calcium  8.9 8.9 - 10.3 mg/dL   Total Protein 6.1 (L) 6.5 - 8.1 g/dL   Albumin 2.7 (L) 3.5 - 5.0 g/dL   AST 17 15 - 41 U/L   ALT 14 0 - 44 U/L   Alkaline Phosphatase 62 38 - 126 U/L   Total Bilirubin 0.5 0.3 - 1.2 mg/dL   GFR, Estimated 41 (L) >60 mL/min    Comment: (NOTE) Calculated using the CKD-EPI Creatinine Equation (2021)    Anion gap 6 5 - 15    Comment: Performed at [facility], [place] [address], [place], [place] [address]  Lipase, blood     Status: None   Collection Time: 09/29/21  6:17 PM  Result Value Ref Range   Lipase 25 11 - 51 U/L    Comment: Performed at [facility], [place] [address]  [place], [place] [address]  CBC with Diff     Status: Abnormal   Collection Time: 09/29/21  6:17 PM  Result Value Ref Range   WBC 9.1 4.0 - 10.5 K/uL   RBC 5.37 4.22 - 5.81 MIL/uL   Hemoglobin 13.9 13.0 - 17.0 g/dL   HCT 41.4 39.0 - 52.0 %   MCV 77.1 (L) 80.0 - 100.0 fL   MCH 25.9 (L) 26.0 - 34.0 pg   MCHC 33.6 30.0 - 36.0 g/dL   RDW 14.0 11.5 - 15.5 %   Platelets 284 150 - 400 K/uL   nRBC 0.0 0.0 - 0.2 %   Neutrophils Relative % 65 %   Neutro Abs 5.8 1.7 - 7.7 K/uL   Lymphocytes Relative 27 %   Lymphs Abs 2.4 0.7 - 4.0 K/uL   Monocytes Relative 6 %   Monocytes Absolute 0.6 0.1 - 1.0 K/uL   Eosinophils Relative 1 %   Eosinophils Absolute 0.1 0.0 - 0.5 K/uL   Basophils Relative 1 %   Basophils Absolute 0.1 0.0 - 0.1 K/uL   Immature Granulocytes 0 %   Abs Immature Granulocytes 0.03 0.00 - 0.07 K/uL    Comment: Performed at [facility], [place] [address], [place], [place] [address]  Urinalysis, Routine w reflex microscopic Urine, Clean Catch     Status: Abnormal   Collection Time: 09/29/21  8:33 PM  Result Value Ref Range   Color, Urine YELLOW YELLOW   APPearance CLEAR CLEAR   Specific Gravity, Urine 1.015 1.005 - 1.030   pH 6.0 5.0 - 8.0   Glucose, UA 150 (A) NEGATIVE mg/dL   Hgb urine dipstick NEGATIVE NEGATIVE   Bilirubin Urine NEGATIVE NEGATIVE   Ketones, ur NEGATIVE NEGATIVE mg/dL   Protein, ur >=300 (A) NEGATIVE mg/dL   Nitrite NEGATIVE NEGATIVE   Leukocytes,Ua NEGATIVE NEGATIVE   RBC / HPF 6-10 0 - 5 RBC/hpf   WBC, UA 0-5 0 - 5 WBC/hpf   Bacteria, UA NONE SEEN NONE SEEN   Mucus PRESENT    Hyaline Casts, UA PRESENT     Comment: Performed at [facility], [place] [address], [place], [place] [address]  Glucose, capillary     Status: Abnormal   Collection Time: 09/30/21 12:43 AM  Result Value Ref Range   Glucose-Capillary 325 (H) 70 - 99 mg/dL    Comment: Glucose reference range applies only to samples taken after fasting for at least 8  hours.  Comprehensive metabolic panel     Status: Abnormal   Collection Time: 09/30/21  4:40 AM  Result Value Ref Range   Sodium 138  135 - 145 mmol/L   Potassium 4.0 3.5 - 5.1 mmol/L   Chloride 109 98 - 111 mmol/L   CO2 25 22 - 32 mmol/L   Glucose, Bld 124 (H) 70 - 99 mg/dL    Comment: Glucose reference range applies only to samples taken after fasting for at least 8 hours.   BUN 22 (H) 6 - 20 mg/dL   Creatinine, Ser 2.35 (H) 0.61 - 1.24 mg/dL   Calcium 8.4 (L) 8.9 - 10.3 mg/dL   Total Protein 5.2 (L) 6.5 - 8.1 g/dL   Albumin 2.3 (L) 3.5 - 5.0 g/dL   AST 14 (L) 15 - 41 U/L   ALT 12 0 - 44 U/L   Alkaline Phosphatase 56 38 - 126 U/L   Total Bilirubin 0.6 0.3 - 1.2 mg/dL   GFR, Estimated 35 (L) >60 mL/min    Comment: (NOTE) Calculated using the CKD-EPI Creatinine Equation (2021)    Anion gap 4 (L) 5 - 15    Comment: Performed at [facility], [place] [address], [place], [place] [address]  CBC     Status: Abnormal   Collection Time: 09/30/21  4:40 AM  Result Value Ref Range   WBC 7.8 4.0 - 10.5 K/uL   RBC 4.65 4.22 - 5.81 MIL/uL   Hemoglobin 11.9 (L) 13.0 - 17.0 g/dL   HCT 36.7 (L) 39.0 - 52.0 %   MCV 78.9 (L) 80.0 - 100.0 fL   MCH 25.6 (L) 26.0 - 34.0 pg   MCHC 32.4 30.0 - 36.0 g/dL   RDW 14.3 11.5 - 15.5 %   Platelets 260 150 - 400 K/uL   nRBC 0.0 0.0 - 0.2 %    Comment: Performed at [facility], [place] [address] [place], [place] [address]  Glucose, capillary     Status: Abnormal   Collection Time: 09/30/21  7:31 AM  Result Value Ref Range   Glucose-Capillary 191 (H) 70 - 99 mg/dL    Comment: Glucose reference range applies only to samples taken after fasting for at least 8 hours.    US Abdomen Limited RUQ (LIVER/GB)  Result Date: 09/29/2021 CLINICAL DATA:  [id number].  Right upper quadrant pain. EXAM: ULTRASOUND ABDOMEN LIMITED RIGHT UPPER QUADRANT COMPARISON:  None Available. FINDINGS: Gallbladder: Multiple gallstones noted within the  gallbladder lumen. No gallbladder wall thickening visualized. No pericholecystic fluid. Positive sonographic Murphy sign noted by sonographer. Common bile duct: Diameter: 4 mm Liver: No focal lesion identified. Within normal limits in parenchymal echogenicity. Portal vein is patent on color Doppler imaging with normal direction of blood flow towards the liver. Other: None. IMPRESSION: Cholelithiasis with a positive sonographic Murphy sign. No associated gallbladder wall thickening or pericholecystic fluid. Correlate clinically for acute cholecystitis. Electronically Signed   By: Iven Finn M.D.   On: 09/29/2021 18:18    I have personally reviewed the relevant RUQ [name], [place] notes from [place], [place], [place]  A/P: Dariyon Urquilla is an 41 y.o. 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  Nadeen Landau, [facility], [place]

## 2021-09-30 NOTE — Progress Notes (Signed)
[facility] OF CARE NOTE Patient: Victor Bryan DIX:[id number]   PCP: Pcp, No DOB: 1980-07-30   DOA: 09/29/2021   DOS: 09/30/2021    Patient was admitted by my colleague earlier on 09/30/2021. I have reviewed the H&P as well as assessment and plan and agree with the same. Important changes in the plan are listed below.  Plan of care: Principal Problem:   Cholelithiasis Active Problems:   AKI (acute kidney injury) ([place])   Paroxysmal atrial fibrillation (HCC)   HTN (hypertension)   Type 2 diabetes mellitus with complication, with long-term current use of insulin (HCC)   OSA (obstructive sleep apnea) Discussed with neurology okay for patient to undergo surgery. At present patient's preop cardiovascular risk is low, do not think that the patient needs cardiology evaluation. Okay to hold anticoagulation. We will transition to heparin. Patient has been educated multiple times that due to his ongoing acute abdominal pain he remains n.p.o. Blood pressure mildly elevated, continue as needed medications.  Author: Berle Mull, MD [facility] 09/30/2021 6:27 PM   If 7PM-7AM, please contact night-coverage at [email or web address]

## 2021-09-30 NOTE — TOC Progression Note (Signed)
Transition of Care [facility]) - Progression Note    Patient Details  Name: Victor Bryan MRN: [id number] Date of Birth: Jul 16, 1980  Transition of Care [facility]) CM/SW Contact  Purcell Mouton, RN Phone Number: 09/30/2021, 2:54 PM  Clinical Narrative:     Transition of Care (TOC) Screening Note   Patient Details  Name: Victor Bryan Date of Birth: Nov 04, 1980   Transition of Care [facility]) CM/SW Contact:    Purcell Mouton, RN Phone Number: 09/30/2021, 2:55 PM    Transition of Care Department [facility]) has reviewed patient and no TOC needs have been identified at this time. We will continue to monitor patient advancement through interdisciplinary progression rounds. If new patient transition needs arise, please place a TOC consult.    Expected Discharge Plan and Services                                                 Social Determinants of Health (SDOH) Interventions    Readmission Risk Interventions    08/28/2021    4:43 PM  Readmission Risk Prevention Plan  Transportation Screening Complete  PCP or Specialist Appt within 3-5 Days Complete  HRI or [place] Complete  Social Work Consult for [place] Planning/Counseling Complete  Palliative Care Screening Not Applicable  Medication Review [employer]) Complete

## 2021-09-30 NOTE — Progress Notes (Signed)
TRH floor coverage for both [facility] and [facility] (remote) on night of 09/29/21 into morning of 09/30/21:    I was notified by RN of persistence of the patient's abdominal discomfort following dose of prn IV Dilaudid, in the setting of this patient who was admitted for acute cholecystitis awaiting surgery.  She also has concomitant acute kidney injury, precluding Toradol as an analgesic option.  Given that she is here with acute cholecystitis, there is concern that oral analgesia may further exacerbate her abdominal discomfort.  Consequently, I am placing an order for an additional one-time dose of Dilaudid to complement the existing prn days for IV Dilaudid.     Babs Bertin, DO Hospitalist

## 2021-10-01 LAB — CBC
HCT: 40.5 % (ref 39.0–52.0)
Hemoglobin: 13.3 g/dL (ref 13.0–17.0)
MCH: 25.8 pg — ABNORMAL LOW (ref 26.0–34.0)
MCHC: 32.8 g/dL (ref 30.0–36.0)
MCV: 78.5 fL — ABNORMAL LOW (ref 80.0–100.0)
Platelets: 249 10*3/uL (ref 150–400)
RBC: 5.16 MIL/uL (ref 4.22–5.81)
RDW: 14 % (ref 11.5–15.5)
WBC: 12.3 10*3/uL — ABNORMAL HIGH (ref 4.0–10.5)
nRBC: 0 % (ref 0.0–0.2)

## 2021-10-01 LAB — COMPREHENSIVE METABOLIC PANEL
ALT: 24 U/L (ref 0–44)
AST: 35 U/L (ref 15–41)
Albumin: 2.3 g/dL — ABNORMAL LOW (ref 3.5–5.0)
Alkaline Phosphatase: 84 U/L (ref 38–126)
Anion gap: 12 (ref 5–15)
BUN: 21 mg/dL — ABNORMAL HIGH (ref 6–20)
CO2: 18 mmol/L — ABNORMAL LOW (ref 22–32)
Calcium: 8.5 mg/dL — ABNORMAL LOW (ref 8.9–10.3)
Chloride: 108 mmol/L (ref 98–111)
Creatinine, Ser: 2.13 mg/dL — ABNORMAL HIGH (ref 0.61–1.24)
GFR, Estimated: 39 mL/min — ABNORMAL LOW (ref >60)
Glucose, Bld: 104 mg/dL — ABNORMAL HIGH (ref 70–99)
Potassium: 4.5 mmol/L (ref 3.5–5.1)
Sodium: 138 mmol/L (ref 135–145)
Total Bilirubin: 0.5 mg/dL (ref 0.3–1.2)
Total Protein: 5.5 g/dL — ABNORMAL LOW (ref 6.5–8.1)

## 2021-10-01 LAB — GLUCOSE, CAPILLARY
Glucose-Capillary: 103 mg/dL — ABNORMAL HIGH (ref 70–99)
Glucose-Capillary: 141 mg/dL — ABNORMAL HIGH (ref 70–99)
Glucose-Capillary: 163 mg/dL — ABNORMAL HIGH (ref 70–99)
Glucose-Capillary: 211 mg/dL — ABNORMAL HIGH (ref 70–99)
Glucose-Capillary: 59 mg/dL — ABNORMAL LOW (ref 70–99)
Glucose-Capillary: 67 mg/dL — ABNORMAL LOW (ref 70–99)
Glucose-Capillary: 96 mg/dL (ref 70–99)
Glucose-Capillary: 99 mg/dL (ref 70–99)

## 2021-10-01 LAB — MAGNESIUM: Magnesium: 2 mg/dL (ref 1.7–2.4)

## 2021-10-01 MED ORDER — HYDRALAZINE HCL 50 MG PO TABS
50.0000 mg | ORAL_TABLET | Freq: Three times a day (TID) | ORAL | Status: DC
  Administered 2021-10-01 – 2021-10-03 (×5): 50 mg via ORAL
  Filled 2021-10-01 (×5): qty 1

## 2021-10-01 MED ORDER — ACETAMINOPHEN 10 MG/ML IV SOLN
1000.0000 mg | Freq: Four times a day (QID) | INTRAVENOUS | Status: DC
Start: 2021-10-02 — End: 2021-10-02
  Administered 2021-10-02: 1000 mg via INTRAVENOUS
  Filled 2021-10-01: qty 100

## 2021-10-01 MED ORDER — METHOCARBAMOL 1000 MG/10ML IJ SOLN
500.0000 mg | Freq: Four times a day (QID) | INTRAVENOUS | Status: DC | PRN
  Administered 2021-10-02: 500 mg via INTRAVENOUS
  Filled 2021-10-01: qty 5
  Filled 2021-10-01: qty 500

## 2021-10-01 NOTE — Progress Notes (Signed)
  Progress Note Patient: Victor Bryan HMC:[id number] DOB: 1980/08/01 DOA: 09/29/2021  DOS: the patient was seen and examined on 10/01/2021  Brief hospital course: Past medical history of CVA, A paroxysmal A-fib, type II DM, HTN, depression, HLD.  Presented to the hospital with complaints of abdominal pain nausea and vomiting.  Found to have cholelithiasis with possible concern for cholecystitis.  General surgery was consulted. Assessment and Plan: Cholelithiasis Acute cholecystitis. Bilirubin level is normal.  LFTs are normal. Condition reports severe abdominal pain. Ultrasound positive for cholelithiasis with positive Murphy sign. General surgery consulted. General surgery ordered a HIDA scan on 6/17. Given that the patient has still not gone through HIDA scan I have called-nuclear medicine.  Patient received narcotic medicine at 921 on 6/18 therefore unable to perform HIDA scan today. Nuclear medicine we will schedule the patient for HIDA scan on 6/19 for 7 AM. Patient will be n.p.o. after midnight. Avoid narcotics after midnight. We will use scheduled Tylenol for pain control and as needed Robaxin. Hopefully general surgery can take the patient for surgery on Monday.  Preop clearance. Discussed with neurology.  Patient does not have any restrictions from neurology. Echocardiogram performed recently shows no acute abnormality. A-fib seems to be under control. Do not think patient requires any further cardiac work-up for surgery.  Paroxysmal A-fib. Hypertensive urgency. Anticoagulation currently on hold. Currently rate controlled. Continue Coreg.  Blood pressure elevated.  Continue hydralazine. Continue pain control as well.  HLD. Continue statin.  OSA. On CPAP.  Type 2 diabetes mellitus, controlled. Patient is on Levemir.  We will continue every 4 hours sliding scale insulin.  CKD 3B. Metabolic acidosis. Patient has baseline serum creatinine around 2.5. No evidence of  acute kidney injury. Monitor.  Obesity. Body mass index is 31.75 kg/m.  Placing the pt at higher risk of poor outcomes.  Subjective: Continues to have abdominal pain.  No nausea or vomiting.  Passing gas.  No BM.  No fever no chills.  Physical Exam: Vitals:   09/30/21 1753 09/30/21 2046 10/01/21 0615 10/01/21 1332  BP: (!) 180/112 120/81 (!) 141/94 (!) 157/101  Pulse: 77 71 78 76  Resp: 16 18 16 17   Temp: (!) 97.4 F (36.3 C) (!) 97.5 F (36.4 C) 97.6 F (36.4 C) 98.5 F (36.9 C)  TempSrc: Oral Oral Oral Oral  SpO2: 100% 100% 100% 100%  Weight:      Height:       General: Appear in moderate distress; no visible Abnormal Neck Mass Or lumps, Conjunctiva normal Cardiovascular: S1 and S2 Present, no Murmur, Respiratory: good respiratory effort, Bilateral Air entry present and CTA, no Crackles, no wheezes Abdomen: Bowel Sound present, diffusely tender. Extremities: no Pedal edema Neurology: alert and oriented to time, place, and person  Gait not checked due to patient safety concerns   Data Reviewed: I have Reviewed nursing notes, Vitals, and Lab results since pt's last encounter. Pertinent lab results CBC and CMP I have ordered test including CBC and CMP    Family Communication: None at bedside  Disposition: Status is: Inpatient Remains inpatient appropriate because: Need further work-up as well as need general surgery guidance for further therapy.  Author: Berle Mull, MD 10/01/2021 8:54 PM  Please look on [email or web address] to find out who is on call.

## 2021-10-01 NOTE — Plan of Care (Signed)
Plan of care reviewed and discussed with the patient. 

## 2021-10-01 NOTE — Hospital Course (Signed)
Past medical history of CVA, A paroxysmal A-fib, type II DM, HTN, depression, HLD.  Presented to the hospital with complaints of abdominal pain nausea and vomiting.  Found to have cholelithiasis with possible concern for cholecystitis.  General surgery was consulted.

## 2021-10-02 ENCOUNTER — Inpatient Hospital Stay (HOSPITAL_COMMUNITY): Payer: Medicare (Managed Care)

## 2021-10-02 LAB — MAGNESIUM: Magnesium: 2.1 mg/dL (ref 1.7–2.4)

## 2021-10-02 LAB — COMPREHENSIVE METABOLIC PANEL
ALT: 19 U/L (ref 0–44)
AST: 20 U/L (ref 15–41)
Albumin: 2.2 g/dL — ABNORMAL LOW (ref 3.5–5.0)
Alkaline Phosphatase: 77 U/L (ref 38–126)
Anion gap: 5 (ref 5–15)
BUN: 19 mg/dL (ref 6–20)
CO2: 24 mmol/L (ref 22–32)
Calcium: 8.4 mg/dL — ABNORMAL LOW (ref 8.9–10.3)
Chloride: 109 mmol/L (ref 98–111)
Creatinine, Ser: 2.14 mg/dL — ABNORMAL HIGH (ref 0.61–1.24)
GFR, Estimated: 39 mL/min — ABNORMAL LOW (ref >60)
Glucose, Bld: 98 mg/dL (ref 70–99)
Potassium: 4.9 mmol/L (ref 3.5–5.1)
Sodium: 138 mmol/L (ref 135–145)
Total Bilirubin: 0.4 mg/dL (ref 0.3–1.2)
Total Protein: 5.5 g/dL — ABNORMAL LOW (ref 6.5–8.1)

## 2021-10-02 LAB — GLUCOSE, CAPILLARY
Glucose-Capillary: 112 mg/dL — ABNORMAL HIGH (ref 70–99)
Glucose-Capillary: 190 mg/dL — ABNORMAL HIGH (ref 70–99)
Glucose-Capillary: 80 mg/dL (ref 70–99)
Glucose-Capillary: 84 mg/dL (ref 70–99)
Glucose-Capillary: 92 mg/dL (ref 70–99)
Glucose-Capillary: 92 mg/dL (ref 70–99)

## 2021-10-02 LAB — CBC
HCT: 37.4 % — ABNORMAL LOW (ref 39.0–52.0)
Hemoglobin: 12.1 g/dL — ABNORMAL LOW (ref 13.0–17.0)
MCH: 25.9 pg — ABNORMAL LOW (ref 26.0–34.0)
MCHC: 32.4 g/dL (ref 30.0–36.0)
MCV: 80.1 fL (ref 80.0–100.0)
Platelets: 239 10*3/uL (ref 150–400)
RBC: 4.67 MIL/uL (ref 4.22–5.81)
RDW: 14.2 % (ref 11.5–15.5)
WBC: 8.9 10*3/uL (ref 4.0–10.5)
nRBC: 0 % (ref 0.0–0.2)

## 2021-10-02 IMAGING — NM NM HEPATOBILIARY IMAGE, INC GB
1 series · 6 of 6 positions shown · non-contrast
Comparison: Abdominal sonogram done on [DATE]

CLINICAL DATA: Pain right upper quadrant, nausea, vomiting

EXAM:
NUCLEAR MEDICINE HEPATOBILIARY IMAGING
TECHNIQUE: Sequential images of the abdomen were obtained [DATE] minutes
following intravenous administration of radiopharmaceutical.
RADIOPHARMACEUTICALS:  5.4 mCi [LS]  Choletec IV

[Series 1: hida scan · 3.28mm/px · 6 of 60 frames shown]
[frame 6/60]
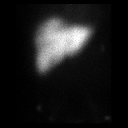
[frame 16/60]
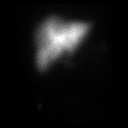
[frame 26/60]
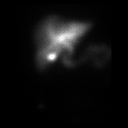
[frame 36/60]
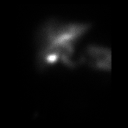
[frame 46/60]
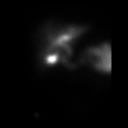
[frame 56/60]
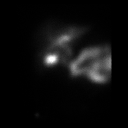

[6 of 6 positions shown; findings below may reference images not displayed]

FINDINGS: Prompt uptake and biliary excretion of activity by the liver is
seen. Gallbladder activity is visualized, consistent with patency of
cystic duct. Biliary activity passes into small bowel, consistent
with patent common bile duct.
IMPRESSION: No abnormality is seen in the radionuclide hepatobiliary scan.
Cystic duct and common bile duct appear to be patent.

## 2021-10-02 MED ORDER — ACETAMINOPHEN 325 MG PO TABS: 650.0000 mg | ORAL_TABLET | Freq: Four times a day (QID) | ORAL | Status: DC | PRN

## 2021-10-02 MED ORDER — BISACODYL 10 MG RE SUPP: 10.0000 mg | Freq: Once | RECTAL | Status: DC

## 2021-10-02 MED ORDER — METHOCARBAMOL 500 MG PO TABS
500.0000 mg | ORAL_TABLET | Freq: Three times a day (TID) | ORAL | Status: DC
  Administered 2021-10-02 – 2021-10-03 (×2): 500 mg via ORAL
  Filled 2021-10-02 (×2): qty 1

## 2021-10-02 MED ORDER — POLYETHYLENE GLYCOL 3350 17 G PO PACK
17.0000 g | PACK | Freq: Every day | ORAL | Status: DC
Start: 2021-10-02 — End: 2021-10-03
  Administered 2021-10-02 – 2021-10-03 (×2): 17 g via ORAL
  Filled 2021-10-02 (×2): qty 1

## 2021-10-02 MED ORDER — IOHEXOL 9 MG/ML PO SOLN
500.0000 mL | ORAL | Status: AC
  Administered 2021-10-02 (×2): 500 mL via ORAL

## 2021-10-02 MED ORDER — INSULIN DETEMIR 100 UNIT/ML ~~LOC~~ SOLN
10.0000 [IU] | Freq: Every day | SUBCUTANEOUS | Status: DC
  Administered 2021-10-02: 10 [IU] via SUBCUTANEOUS
  Filled 2021-10-02 (×2): qty 0.1

## 2021-10-02 MED ORDER — ACETAMINOPHEN 650 MG RE SUPP: 650.0000 mg | Freq: Four times a day (QID) | RECTAL | Status: DC | PRN

## 2021-10-02 MED ORDER — CITALOPRAM HYDROBROMIDE 20 MG PO TABS
40.0000 mg | ORAL_TABLET | Freq: Every day | ORAL | Status: DC
  Administered 2021-10-02 – 2021-10-03 (×2): 40 mg via ORAL
  Filled 2021-10-02 (×2): qty 2

## 2021-10-02 MED ORDER — OXYCODONE HCL 5 MG PO TABS
5.0000 mg | ORAL_TABLET | ORAL | Status: DC | PRN
  Administered 2021-10-02 – 2021-10-03 (×6): 10 mg via ORAL
  Filled 2021-10-02 (×6): qty 2

## 2021-10-02 MED ORDER — TECHNETIUM TC 99M MEBROFENIN IV KIT
5.4000 | PACK | Freq: Once | INTRAVENOUS | Status: AC | PRN
  Administered 2021-10-02: 5.4 via INTRAVENOUS

## 2021-10-02 MED ORDER — BISACODYL 10 MG RE SUPP: 10.0000 mg | Freq: Every day | RECTAL | Status: DC | PRN

## 2021-10-02 MED ORDER — IOHEXOL 9 MG/ML PO SOLN
ORAL | Status: AC
  Filled 2021-10-02: qty 1000

## 2021-10-02 MED ORDER — HYDROMORPHONE HCL 1 MG/ML IJ SOLN: 0.5000 mg | INTRAMUSCULAR | Status: DC | PRN

## 2021-10-02 MED ORDER — OXYCODONE HCL 5 MG PO TABS: 5.0000 mg | ORAL_TABLET | ORAL | Status: DC | PRN

## 2021-10-02 MED ORDER — APIXABAN 5 MG PO TABS
5.0000 mg | ORAL_TABLET | Freq: Two times a day (BID) | ORAL | Status: DC
  Administered 2021-10-02 – 2021-10-03 (×2): 5 mg via ORAL
  Filled 2021-10-02 (×2): qty 1

## 2021-10-02 MED ORDER — DICYCLOMINE HCL 10 MG PO CAPS
10.0000 mg | ORAL_CAPSULE | Freq: Three times a day (TID) | ORAL | Status: DC
  Administered 2021-10-02 – 2021-10-03 (×5): 10 mg via ORAL
  Filled 2021-10-02 (×5): qty 1

## 2021-10-02 MED ORDER — PANTOPRAZOLE SODIUM 40 MG PO TBEC
40.0000 mg | DELAYED_RELEASE_TABLET | Freq: Two times a day (BID) | ORAL | Status: DC
  Administered 2021-10-02 – 2021-10-03 (×2): 40 mg via ORAL
  Filled 2021-10-02 (×2): qty 1

## 2021-10-02 MED ORDER — SENNOSIDES-DOCUSATE SODIUM 8.6-50 MG PO TABS
1.0000 | ORAL_TABLET | Freq: Two times a day (BID) | ORAL | Status: DC
  Filled 2021-10-02 (×2): qty 1

## 2021-10-02 NOTE — Progress Notes (Signed)
Progress Note Patient: Victor Bryan IOX:[id number] DOB: [birth year] DOA: 09/29/2021  DOS: the patient was seen and examined on 10/02/2021  Brief hospital course: Past medical history of CVA, A paroxysmal A-fib, type II DM, HTN, depression, HLD.  Presented to the hospital with complaints of abdominal pain nausea and vomiting.  Found to have cholelithiasis with possible concern for cholecystitis.  General surgery was consulted. Assessment and Plan: Cholelithiasis Biliary colic.  Acute cholecystitis ruled out. Bilirubin level is normal.  LFTs are normal. Ultrasound positive for cholelithiasis with positive Murphy sign. General surgery consulted. Underwent HIDA scan although test was delayed over the weekend.  HIDA scan negative for any cholecystitis. General surgery recommends that the patient will require lap chole but due to his recency from his acute stroke currently recommend outpatient follow-up. Also biliary colic should be an intermittent pain where as the patient continues to have pain ongoing since last 3 months.  Chronic abdominal pain. Etiology of the chronic abdominal pain is not very clear although definitely not could cause of cholecystitis. CT abdomen pelvis was also performed which shows no evidence of acute intra-abdominal pathology. Possible musculoskeletal pain or pain in the setting of chronic use of accident diarrhea cannot be ruled out. No evidence of pancreatitis. We will continue pain control medication as well as advance diet and monitor response.  Paroxysmal A-fib. Hypertensive urgency. Resuming anticoagulation. Continue Coreg. Continue hydralazine. Continue pain control.   HLD. Continue statin.   OSA. On CPAP.   Type 2 diabetes mellitus, controlled. Patient is on Levemir.  Changed to ACHS coverage. Patient is on exenatide at home.  Tells me that he is on it for many years. Currently I have a very high suspicion that that medication is associated with  patient's chronic abdominal pain. Also possibility of gastroparesis in the patient's case cannot be ruled out although patient denies having any nausea or vomiting. Patient does have significant weight loss roughly 20 pounds in last 6 months which is also concerning.   CKD 3B. Metabolic acidosis. Patient has baseline serum creatinine around 2.5. No evidence of acute kidney injury. Monitor.   Obesity. Body mass index is 31.75 kg/m.  Placing the pt at higher risk of poor outcomes.  Concern for pneumonitis/vascular congestion. CT abdomen pelvis makes comment about having pneumonitis or vascular congestion. Patient was actually aggressively hydrated for possibility of cholecystitis or infection. For now I will be holding all the fluid and monitor response. If does not improve we will provide Lasix.  Subjective: Continues to report abdominal pain.  No nausea no vomiting.  Passing gas but no BM.  Physical Exam: Vitals:   10/01/21 2108 10/02/21 0607 10/02/21 0625 10/02/21 1400  BP: 125/84 (!) 157/103 (!) 147/103 (!) 151/93  Pulse: 71 70 70 78  Resp: 18  16 18   Temp: 97.8 F (36.6 C)  (!) 97.5 F (36.4 C) 97.6 F (36.4 C)  TempSrc: Oral     SpO2: 96% 100% 100% 98%  Weight:      Height:       General: Appear in moderate distress; no visible Abnormal Neck Mass Or lumps, Conjunctiva normal Cardiovascular: S1 and S2 Present, no Murmur, Respiratory: good respiratory effort, Bilateral Air entry present and CTA, no Crackles, no wheezes Abdomen: Bowel Sound present, right upper quadrant tenderness Extremities: no Pedal edema Neurology: alert and oriented to time, place, and person  Gait not checked due to patient safety concerns   Data Reviewed: I have Reviewed nursing notes, Vitals, and Lab results since  pt's last encounter. Pertinent lab results HIDA scan, CT scan of the abdomen, CBC and CMP I have ordered test including CBC and CMP I have discussed pt's care plan and test results  with general surgery.   Family Communication: Wife at bedside  Disposition: Status is: Inpatient Remains inpatient appropriate because: Continues to have poor p.o. intake due to severe pain.  Requires further work-up.  Author: Berle Mull, MD 10/02/2021 7:52 PM  Please look on [email or web address] to find out who is on call.

## 2021-10-02 NOTE — Progress Notes (Signed)
Subjective: Patient still saying he is having RUQ abdominal pain.  States this has been going on for over a year.  He is also hungry and would like to eat.     Objective: Vital signs in last 24 hours: Temp:  [97.5 F (36.4 C)-98.5 F (36.9 C)] 97.5 F (36.4 C) (06/19 0625) Pulse Rate:  [70-76] 70 (06/19 0625) Resp:  [16-18] 16 (06/19 0625) BP: (125-157)/(84-103) 147/103 (06/19 0625) SpO2:  [96 %-100 %] 100 % (06/19 0625) Last BM Date : 10/01/21  Intake/Output from previous day: 06/18 0701 - 06/19 0700 In: 4502 [P.O.:1800; I.V.:2452; IV Piggyback:250] Out: 1700 [Urine:1700] Intake/Output this shift: No intake/output data recorded.  PE: Abd: soft, tender in RUQ, ND  Lab Results:  Recent Labs    10/01/21 0443 10/02/21 0404  WBC 12.3* 8.9  HGB 13.3 12.1*  HCT 40.5 37.4*  PLT 249 239   BMET Recent Labs    10/01/21 0443 10/02/21 0404  NA 138 138  K 4.5 4.9  CL 108 109  CO2 18* 24  GLUCOSE 104* 98  BUN 21* 19  CREATININE 2.13* 2.14*  CALCIUM 8.5* 8.4*   PT/INR No results for input(s): "LABPROT", "INR" in the last 72 hours. CMP     Component Value Date/Time   NA 138 10/02/2021 0404   K 4.9 10/02/2021 0404   CL 109 10/02/2021 0404   CO2 24 10/02/2021 0404   GLUCOSE 98 10/02/2021 0404   BUN 19 10/02/2021 0404   CREATININE 2.14 (H) 10/02/2021 0404   CALCIUM 8.4 (L) 10/02/2021 0404   PROT 5.5 (L) 10/02/2021 0404   ALBUMIN 2.2 (L) 10/02/2021 0404   AST 20 10/02/2021 0404   ALT 19 10/02/2021 0404   ALKPHOS 77 10/02/2021 0404   BILITOT 0.4 10/02/2021 0404   GFRNONAA 39 (L) 10/02/2021 0404   Lipase     Component Value Date/Time   LIPASE 25 09/29/2021 1817       Studies/Results: NM Hepatobiliary Liver Func  Result Date: 10/02/2021 CLINICAL DATA:  Pain right upper quadrant, nausea, vomiting EXAM: NUCLEAR MEDICINE HEPATOBILIARY IMAGING TECHNIQUE: Sequential images of the abdomen were obtained out to 60 minutes following intravenous  administration of radiopharmaceutical. RADIOPHARMACEUTICALS:  5.4 mCi Tc-36m  Choletec IV COMPARISON:  Abdominal sonogram done on 09/29/2021 FINDINGS: Prompt uptake and biliary excretion of activity by the liver is seen. Gallbladder activity is visualized, consistent with patency of cystic duct. Biliary activity passes into small bowel, consistent with patent common bile duct. IMPRESSION: No abnormality is seen in the radionuclide hepatobiliary scan. Cystic duct and common bile duct appear to be patent. Electronically Signed   By: Elmer Picker M.D.   On: 10/02/2021 08:24    Anti-infectives: Anti-infectives (From admission, onward)    Start     Dose/Rate Route Frequency Ordered Stop   09/30/21 0045  cefTRIAXone (ROCEPHIN) 2 g in sodium chloride 0.9 % 100 mL IVPB  Status:  Discontinued        2 g 200 mL/hr over 30 Minutes Intravenous Every 24 hours 09/30/21 0033 10/02/21 1138        Assessment/Plan RUQ abdominal pain, cholelithiasis -HIDA scan negative for cholecystitis -case discussed with surgeon as well as primary team.  Patient has had 2 CVAs in last several months, with last one Aug 23, 2021.  He is high-risk for operative intervention at this time for progression of his previous CVA or a new CVA.  It is not recommend in the setting of  no acute infection to proceed with surgical intervention at this time. -may resume eliquis -will have him follow in the office in several weeks to discuss being electively set up for cholecystectomy when felt safe. -of note, he was seen at [facility] system on 5/30 for dizziness and falling after his CVA as well as abdominal pain.  He was noted to have the same findings then as now with just cholelithiasis.  He was discharged home on narcotic pain medications. -it is unclear in the setting of no acute cholecystitis why the patient is having persistent moderate to severe abdominal pain as this is not typical generally of biliary colic.  He also states  this has been going on for over a year now.  Unclear why this hasn't been pursued before now as well. -discussed all of this with the patient at bedside today regarding concerns on why we would not proceed with surgery at this time and what the follow up expectation would be.   -he may try a low fat diet. -discussed with primary service as well.   FEN - low fat diet VTE - may resume eliquis ID - none needed  CVA in Jan/May A fib HTN HLD OSA DM CKD  I reviewed hospitalist notes, last 24 h vitals and pain scores, last 48 h intake and output, last 24 h labs and trends, and last 24 h imaging results.   LOS: 3 days    Henreitta Cea , [facility] 10/02/2021, 12:02 PM Please see Amion for pager number during day hours 7:00am-4:30pm or 7:00am -11:30am on weekends

## 2021-10-02 NOTE — Progress Notes (Addendum)
Patient left floor for HIDA scan. Per staff patient will be off the floor for 1-2 hours.

## 2021-10-02 NOTE — Progress Notes (Signed)
Inpatient Diabetes Program Recommendations  AACE/ADA: New Consensus Statement on Inpatient Glycemic Control (2015)  Target Ranges:  Prepandial:   less than 140 mg/dL      Peak postprandial:   less than 180 mg/dL (1-2 hours)      Critically ill patients:  140 - 180 mg/dL   Lab Results  Component Value Date   GLUCAP 112 (H) 10/02/2021   HGBA1C 7.2 (H) 08/27/2021    Review of Glycemic Control  Latest Reference Range & Units 10/01/21 23:50 10/02/21 00:09 10/02/21 03:49 10/02/21 03:51 10/02/21 08:25  Glucose-Capillary 70 - 99 mg/dL 59 (L) 80 92 92 112 (H)  (L): Data is abnormally low (H): Data is abnormally high Diabetes history:  Type 2 DM Outpatient Diabetes medications: Bydureon 2 mg qwk, Levemir 15 units QHS Current orders for Inpatient glycemic control: Levemir 15 units QHS, Novolog 0-9 units Q4H  Inpatient Diabetes Program Recommendations:     Consider reducing Levemir to 12 units QHS and decreasing correction to TID & Hs.   Thanks, Bronson Curb, MSN, RNC-OB Diabetes Coordinator [phone] (8a-5p)

## 2021-10-03 LAB — COMPREHENSIVE METABOLIC PANEL
ALT: 17 U/L (ref 0–44)
AST: 17 U/L (ref 15–41)
Albumin: 2.3 g/dL — ABNORMAL LOW (ref 3.5–5.0)
Alkaline Phosphatase: 80 U/L (ref 38–126)
Anion gap: 5 (ref 5–15)
BUN: 17 mg/dL (ref 6–20)
CO2: 24 mmol/L (ref 22–32)
Calcium: 8.7 mg/dL — ABNORMAL LOW (ref 8.9–10.3)
Chloride: 109 mmol/L (ref 98–111)
Creatinine, Ser: 2.19 mg/dL — ABNORMAL HIGH (ref 0.61–1.24)
GFR, Estimated: 38 mL/min — ABNORMAL LOW (ref >60)
Glucose, Bld: 178 mg/dL — ABNORMAL HIGH (ref 70–99)
Potassium: 4.6 mmol/L (ref 3.5–5.1)
Sodium: 138 mmol/L (ref 135–145)
Total Bilirubin: 0.3 mg/dL (ref 0.3–1.2)
Total Protein: 5.5 g/dL — ABNORMAL LOW (ref 6.5–8.1)

## 2021-10-03 LAB — CBC
HCT: 38 % — ABNORMAL LOW (ref 39.0–52.0)
Hemoglobin: 12.4 g/dL — ABNORMAL LOW (ref 13.0–17.0)
MCH: 26.1 pg (ref 26.0–34.0)
MCHC: 32.6 g/dL (ref 30.0–36.0)
MCV: 79.8 fL — ABNORMAL LOW (ref 80.0–100.0)
Platelets: 266 10*3/uL (ref 150–400)
RBC: 4.76 MIL/uL (ref 4.22–5.81)
RDW: 14.2 % (ref 11.5–15.5)
WBC: 9 10*3/uL (ref 4.0–10.5)
nRBC: 0 % (ref 0.0–0.2)

## 2021-10-03 LAB — MAGNESIUM: Magnesium: 2 mg/dL (ref 1.7–2.4)

## 2021-10-03 MED ORDER — OXYCODONE-ACETAMINOPHEN 10-325 MG PO TABS: 1.0000 | ORAL_TABLET | Freq: Four times a day (QID) | ORAL | 0 refills | Status: AC | PRN

## 2021-10-03 MED ORDER — INSULIN DETEMIR 100 UNIT/ML ~~LOC~~ SOLN: 10.0000 [IU] | Freq: Every day | SUBCUTANEOUS | 0 refills | Status: AC

## 2021-10-03 MED ORDER — METHOCARBAMOL 500 MG PO TABS: 500.0000 mg | ORAL_TABLET | Freq: Three times a day (TID) | ORAL | 0 refills | Status: AC

## 2021-10-03 MED ORDER — POLYETHYLENE GLYCOL 3350 17 G PO PACK: 17.0000 g | PACK | Freq: Every day | ORAL | 0 refills | Status: AC

## 2021-10-03 MED ORDER — DOCUSATE SODIUM 100 MG PO CAPS: 100.0000 mg | ORAL_CAPSULE | Freq: Two times a day (BID) | ORAL | 2 refills | Status: AC

## 2021-10-03 MED ORDER — DICYCLOMINE HCL 10 MG PO CAPS: 10.0000 mg | ORAL_CAPSULE | Freq: Three times a day (TID) | ORAL | 0 refills | Status: AC

## 2021-10-03 MED ORDER — BISACODYL 10 MG RE SUPP: 10.0000 mg | Freq: Every day | RECTAL | 0 refills | Status: AC | PRN

## 2021-10-03 NOTE — Progress Notes (Signed)
Discharge instructions discussed with patient, verbalized agreement and understanding 

## 2022-02-26 DIAGNOSIS — I5033 Acute on chronic diastolic (congestive) heart failure: Secondary | ICD-10-CM | POA: Insufficient documentation

## 2022-02-26 DIAGNOSIS — N179 Acute kidney failure, unspecified: Secondary | ICD-10-CM | POA: Insufficient documentation

## 2022-02-26 DIAGNOSIS — E119 Type 2 diabetes mellitus without complications: Secondary | ICD-10-CM | POA: Insufficient documentation

## 2022-03-03 DIAGNOSIS — N184 Chronic kidney disease, stage 4 (severe): Secondary | ICD-10-CM | POA: Insufficient documentation

## 2022-03-04 DIAGNOSIS — N183 Chronic kidney disease, stage 3 unspecified: Secondary | ICD-10-CM | POA: Insufficient documentation

## 2022-03-04 DIAGNOSIS — E669 Obesity, unspecified: Secondary | ICD-10-CM | POA: Insufficient documentation

## 2022-03-04 DIAGNOSIS — Z8673 Personal history of transient ischemic attack (TIA), and cerebral infarction without residual deficits: Secondary | ICD-10-CM | POA: Insufficient documentation

## 2022-04-05 DIAGNOSIS — E1165 Type 2 diabetes mellitus with hyperglycemia: Secondary | ICD-10-CM | POA: Insufficient documentation

## 2022-04-05 NOTE — Progress Notes (Deleted)
[facility]  PATIENT: Victor Bryan DOB: [birth year]  REFERRING CLINICIAN: Derek Jack, MD HISTORY FROM: *** REASON FOR VISIT: headache, vision chages   HISTORICAL  CHIEF COMPLAINT:  No chief complaint on file.   HISTORY OF PRESENT ILLNESS:  ***   OTHER MEDICAL CONDITIONS: HTN, pfib, DM2, CVA   REVIEW OF SYSTEMS: Full 14 system review of systems performed and negative with exception of: ***  ALLERGIES: Allergies  Allergen Reactions   Morphine Itching    HOME MEDICATIONS: Outpatient Medications Prior to Visit  Medication Sig Dispense Refill   apixaban (ELIQUIS) 5 MG TABS tablet Take 1 tablet (5 mg total) by mouth 2 (two) times daily. 60 tablet 0   atorvastatin (LIPITOR) 40 MG tablet Take 1 tablet (40 mg total) by mouth daily. 30 tablet 0   bisacodyl (DULCOLAX) 10 MG suppository Place 1 suppository (10 mg total) rectally daily as needed for moderate constipation. 12 suppository 0   carvedilol (COREG) 12.5 MG tablet Take 12.5 mg by mouth 2 (two) times daily.     citalopram (CELEXA) 40 MG tablet Take 40 mg by mouth daily.     dicyclomine (BENTYL) 10 MG capsule Take 1 capsule (10 mg total) by mouth 3 (three) times daily before meals. 60 capsule 0   diltiazem (TIAZAC) 180 MG 24 hr capsule Take 1 capsule (180 mg total) by mouth daily. 30 capsule 0   docusate sodium (COLACE) 100 MG capsule Take 1 capsule (100 mg total) by mouth 2 (two) times daily. 60 capsule 2   hydrALAZINE (APRESOLINE) 25 MG tablet Take 1 tablet (25 mg total) by mouth every 8 (eight) hours. 90 tablet 0   insulin detemir (LEVEMIR) 100 UNIT/ML injection Inject 0.1 mLs (10 Units total) into the skin at bedtime. 10 mL 0   methocarbamol (ROBAXIN) 500 MG tablet Take 1 tablet (500 mg total) by mouth 3 (three) times daily. 45 tablet 0   omeprazole (PRILOSEC) 40 MG capsule Take 40 mg by mouth daily.     oxyCODONE-acetaminophen (PERCOCET) 10-325 MG tablet Take 1 tablet by mouth every 6 (six)  hours as needed for pain. 20 tablet 0   polyethylene glycol (MIRALAX / GLYCOLAX) 17 g packet Take 17 g by mouth daily. 14 each 0   No facility-administered medications prior to visit.    PAST MEDICAL HISTORY: Past Medical History:  Diagnosis Date   Atrial fibrillation ([place])    Diabetes mellitus without complication ([place])    Hyperlipidemia    Hypertension    Migraine    Stroke ([place])     PAST SURGICAL HISTORY: Past Surgical History:  Procedure Laterality Date   HAND SURGERY Right    TONSILLECTOMY     tubes in ears      FAMILY HISTORY: Family History  Problem Relation Age of Onset   Diabetes Mother    Heart failure Father    Diabetes Father     SOCIAL HISTORY: Social History   Socioeconomic History   Marital status: Single    Spouse name: Not on file   Number of children: Not on file   Years of education: Not on file   Highest education level: Not on file  Occupational History   Not on file  Tobacco Use   Smoking status: Never   Smokeless tobacco: Never  Vaping Use   Vaping Use: Never used  Substance and Sexual Activity   Alcohol use: Not Currently   Drug use: Not Currently    Types: Marijuana  Sexual activity: Yes  Other Topics Concern   Not on file  Social History Narrative   Not on file   Social Determinants of Health   Financial Resource Strain: Not on file  Food Insecurity: Not on file  Transportation Needs: Not on file  Physical Activity: Not on file  Stress: Not on file  Social Connections: Not on file  Intimate Partner Violence: Not on file     PHYSICAL EXAM ***  GENERAL EXAM/CONSTITUTIONAL: Vitals: There were no vitals filed for this visit. There is no height or weight on file to calculate BMI. Wt Readings from Last 3 Encounters:  09/29/21 215 lb (97.5 kg)  08/31/21 212 lb 3.2 oz (96.3 kg)  08/01/21 260 lb (117.9 kg)   Patient is in no distress; well developed, nourished and groomed; neck is  supple  CARDIOVASCULAR: Examination of carotid arteries is normal; no carotid bruits Regular rate and rhythm, no murmurs Examination of peripheral vascular system by observation and palpation is normal  EYES: Pupils round and reactive to light, Visual fields full to confrontation, Extraocular movements intacts,   MUSCULOSKELETAL: Gait, strength, tone, movements noted in Neurologic exam below  NEUROLOGIC: MENTAL STATUS:      No data to display         awake, alert, oriented to person, place and time recent and remote memory intact normal attention and concentration language fluent, comprehension intact, naming intact fund of knowledge appropriate  CRANIAL NERVE:  2nd - no papilledema or hemorrhages on fundoscopic exam 2nd, 3rd, 4th, 6th - pupils equal and reactive to light, visual fields full to confrontation, extraocular muscles intact, no nystagmus 5th - facial sensation symmetric 7th - facial strength symmetric 8th - hearing intact 9th - palate elevates symmetrically, uvula midline 11th - shoulder shrug symmetric 12th - tongue protrusion midline  MOTOR:  normal bulk and tone, full strength in the BUE, BLE  SENSORY:  normal and symmetric to light touch, pinprick, temperature, vibration  COORDINATION:  finger-nose-finger, fine finger movements normal  REFLEXES:  deep tendon reflexes present and symmetric  GAIT/STATION:  normal     DIAGNOSTIC DATA (LABS, IMAGING, TESTING) - I reviewed patient records, labs, notes, testing and imaging myself where available.  Lab Results  Component Value Date   WBC 9.0 10/03/2021   HGB 12.4 (L) 10/03/2021   HCT 38.0 (L) 10/03/2021   MCV 79.8 (L) 10/03/2021   PLT 266 10/03/2021      Component Value Date/Time   NA 138 10/03/2021 0439   K 4.6 10/03/2021 0439   CL 109 10/03/2021 0439   CO2 24 10/03/2021 0439   GLUCOSE 178 (H) 10/03/2021 0439   BUN 17 10/03/2021 0439   CREATININE 2.19 (H) 10/03/2021 0439   CALCIUM  8.7 (L) 10/03/2021 0439   PROT 5.5 (L) 10/03/2021 0439   ALBUMIN 2.3 (L) 10/03/2021 0439   AST 17 10/03/2021 0439   ALT 17 10/03/2021 0439   ALKPHOS 80 10/03/2021 0439   BILITOT 0.3 10/03/2021 0439   GFRNONAA 38 (L) 10/03/2021 0439   Lab Results  Component Value Date   CHOL 234 (H) 08/27/2021   HDL 43 08/27/2021   LDLCALC 169 (H) 08/27/2021   TRIG 110 08/27/2021   CHOLHDL 5.4 08/27/2021   Lab Results  Component Value Date   HGBA1C 7.2 (H) 08/27/2021   No results found for: "VITAMINB12" Lab Results  Component Value Date   TSH 2.015 05/31/2021    ***    ASSESSMENT AND PLAN  41 y.o.  year old male with ***   No diagnosis found.    PLAN:   No orders of the defined types were placed in this encounter.   No orders of the defined types were placed in this encounter.   No follow-ups on file.    Genia Harold, MD  I spent an average of *** chart reviewing and counseling the patient, with at least 50% of the time face to face with the patient.   [facility] [address] [place], [place] [address] [phone]

## 2022-04-06 ENCOUNTER — Ambulatory Visit: Payer: Medicare (Managed Care) | Admitting: Psychiatry
